# Patient Record
Sex: Female | Born: 1960 | Race: White | Hispanic: No | Marital: Single | Smoking: Former smoker
Health system: Southern US, Community
[De-identification: ages and names within clinical notes are randomized; demographics above are authoritative.]

## PROBLEM LIST (undated history)

## (undated) DIAGNOSIS — G4700 Insomnia, unspecified: Secondary | ICD-10-CM

## (undated) DIAGNOSIS — F32A Depression, unspecified: Secondary | ICD-10-CM

## (undated) DIAGNOSIS — R195 Other fecal abnormalities: Secondary | ICD-10-CM

## (undated) DIAGNOSIS — F419 Anxiety disorder, unspecified: Secondary | ICD-10-CM

## (undated) DIAGNOSIS — K5900 Constipation, unspecified: Secondary | ICD-10-CM

## (undated) DIAGNOSIS — R569 Unspecified convulsions: Secondary | ICD-10-CM

## (undated) DIAGNOSIS — Z9889 Other specified postprocedural states: Secondary | ICD-10-CM

## (undated) DIAGNOSIS — M419 Scoliosis, unspecified: Secondary | ICD-10-CM

## (undated) DIAGNOSIS — K449 Diaphragmatic hernia without obstruction or gangrene: Secondary | ICD-10-CM

## (undated) DIAGNOSIS — F329 Major depressive disorder, single episode, unspecified: Secondary | ICD-10-CM

## (undated) DIAGNOSIS — R112 Nausea with vomiting, unspecified: Secondary | ICD-10-CM

## (undated) DIAGNOSIS — R109 Unspecified abdominal pain: Secondary | ICD-10-CM

## (undated) DIAGNOSIS — F99 Mental disorder, not otherwise specified: Secondary | ICD-10-CM

## (undated) HISTORY — DX: Depression, unspecified: F32.A

## (undated) HISTORY — DX: Insomnia, unspecified: G47.00

## (undated) HISTORY — PX: BREAST SURGERY: SHX581

## (undated) HISTORY — DX: Unspecified convulsions: R56.9

## (undated) HISTORY — DX: Other fecal abnormalities: R19.5

## (undated) HISTORY — PX: DIAGNOSTIC LAPAROSCOPY: SUR761

## (undated) HISTORY — DX: Major depressive disorder, single episode, unspecified: F32.9

## (undated) HISTORY — DX: Anxiety disorder, unspecified: F41.9

## (undated) HISTORY — DX: Constipation, unspecified: K59.00

## (undated) HISTORY — DX: Unspecified abdominal pain: R10.9

## (undated) HISTORY — DX: Mental disorder, not otherwise specified: F99

## (undated) HISTORY — DX: Diaphragmatic hernia without obstruction or gangrene: K44.9

## (undated) HISTORY — DX: Scoliosis, unspecified: M41.9

---

## 1984-03-03 HISTORY — PX: BREAST BIOPSY: SHX20

## 2001-02-12 ENCOUNTER — Other Ambulatory Visit: Admission: RE | Admit: 2001-02-12 | Discharge: 2001-02-12 | Payer: Self-pay | Admitting: Obstetrics and Gynecology

## 2001-02-16 ENCOUNTER — Ambulatory Visit (HOSPITAL_COMMUNITY): Admission: RE | Admit: 2001-02-16 | Discharge: 2001-02-16 | Payer: Self-pay | Admitting: Obstetrics and Gynecology

## 2001-02-16 ENCOUNTER — Encounter: Payer: Self-pay | Admitting: Obstetrics and Gynecology

## 2002-06-07 ENCOUNTER — Ambulatory Visit (HOSPITAL_COMMUNITY): Admission: RE | Admit: 2002-06-07 | Discharge: 2002-06-07 | Payer: Self-pay | Admitting: Obstetrics and Gynecology

## 2002-06-07 ENCOUNTER — Encounter: Payer: Self-pay | Admitting: Obstetrics and Gynecology

## 2003-01-11 ENCOUNTER — Ambulatory Visit (HOSPITAL_COMMUNITY): Admission: RE | Admit: 2003-01-11 | Discharge: 2003-01-11 | Payer: Self-pay | Admitting: Obstetrics & Gynecology

## 2003-06-13 ENCOUNTER — Ambulatory Visit (HOSPITAL_COMMUNITY): Admission: RE | Admit: 2003-06-13 | Discharge: 2003-06-13 | Payer: Self-pay | Admitting: Obstetrics and Gynecology

## 2006-09-15 ENCOUNTER — Ambulatory Visit (HOSPITAL_COMMUNITY): Admission: RE | Admit: 2006-09-15 | Discharge: 2006-09-15 | Payer: Self-pay | Admitting: Obstetrics and Gynecology

## 2006-12-30 ENCOUNTER — Other Ambulatory Visit: Admission: RE | Admit: 2006-12-30 | Discharge: 2006-12-30 | Payer: Self-pay | Admitting: Obstetrics and Gynecology

## 2008-03-27 ENCOUNTER — Other Ambulatory Visit: Admission: RE | Admit: 2008-03-27 | Discharge: 2008-03-27 | Payer: Self-pay | Admitting: Obstetrics and Gynecology

## 2008-06-28 ENCOUNTER — Ambulatory Visit (HOSPITAL_COMMUNITY): Admission: RE | Admit: 2008-06-28 | Discharge: 2008-06-28 | Payer: Self-pay | Admitting: Obstetrics and Gynecology

## 2011-01-09 ENCOUNTER — Other Ambulatory Visit (HOSPITAL_COMMUNITY)
Admission: RE | Admit: 2011-01-09 | Discharge: 2011-01-09 | Disposition: A | Discharge status: Home / Self Care | Payer: BC Managed Care – PPO | Source: Ambulatory Visit | Attending: Obstetrics and Gynecology | Admitting: Obstetrics and Gynecology

## 2011-01-09 ENCOUNTER — Other Ambulatory Visit: Payer: Self-pay | Admitting: Adult Health

## 2011-01-09 DIAGNOSIS — Z01419 Encounter for gynecological examination (general) (routine) without abnormal findings: Secondary | ICD-10-CM | POA: Insufficient documentation

## 2011-01-09 DIAGNOSIS — R87810 Cervical high risk human papillomavirus (HPV) DNA test positive: Secondary | ICD-10-CM | POA: Insufficient documentation

## 2011-05-01 ENCOUNTER — Other Ambulatory Visit: Payer: Self-pay | Admitting: Obstetrics & Gynecology

## 2011-08-07 ENCOUNTER — Encounter (HOSPITAL_COMMUNITY): Payer: Self-pay | Admitting: Pharmacy Technician

## 2011-08-08 NOTE — Patient Instructions (Addendum)
20 Deborah Lucero  08/08/2011   Your procedure is scheduled on:   08/14/2011  Report to [facility] at  855  AM.  Call this number if you have problems the morning of surgery: [phone]   Remember:   Do not eat food:After Midnight.  May have clear liquids:until Midnight .    Take these medicines the morning of surgery with A SIP OF WATER: celexa   Do not wear jewelry, make-up or nail polish.  Do not wear lotions, powders, or perfumes. You may wear deodorant.  Do not shave 48 hours prior to surgery. Men may shave face and neck.  Do not bring valuables to the hospital.  Contacts, dentures or bridgework may not be worn into surgery.  Leave suitcase in the car. After surgery it may be brought to your room.  For patients admitted to the hospital, checkout time is 11:00 AM the day of discharge.   Patients discharged the day of surgery will not be allowed to drive home.  Name and phone number of your driver: family  Special Instructions: CHG Shower Use Special Wash: 1/2 bottle night before surgery and 1/2 bottle morning of surgery.   Please read over the following fact sheets that you were given: Pain Booklet, MRSA Information, Surgical Site Infection Prevention, Anesthesia Post-op Instructions and Care and Recovery After Surgery Endometrial Ablation Endometrial ablation removes the lining of the uterus (endometrium). It is usually a same day, outpatient treatment. Ablation helps avoid major surgery (such as a hysterectomy). A hysterectomy is removal of the cervix and uterus. Endometrial ablation has less risk and complications, has a shorter recovery period and is less expensive. After endometrial ablation, most women will have little or no menstrual bleeding. You may not keep your fertility. Pregnancy is no longer likely after this procedure but if you are pre-menopausal, you still need to use a reliable method of birth control following the procedure because pregnancy can occur. REASONS TO  HAVE THE PROCEDURE MAY INCLUDE:  Heavy periods.   Bleeding that is causing anemia.   Anovulatory bleeding, very irregular, bleeding.   Bleeding submucous fibroids (on the lining inside the uterus) if they are smaller than 3 centimeters.  REASONS NOT TO HAVE THE PROCEDURE MAY INCLUDE:  You wish to have more children.   You have a pre-cancerous or cancerous problem. The cause of any abnormal bleeding must be diagnosed before having the procedure.   You have pain coming from the uterus.   You have a submucus fibroid larger than 3 centimeters.   You recently had a baby.   You recently had an infection in the uterus.   You have a severe retro-flexed, tipped uterus and cannot insert the instrument to do the ablation.   You had a Cesarean section or deep major surgery on the uterus.   The inner cavity of the uterus is too large for the endometrial ablation instrument.  RISKS AND COMPLICATIONS   Perforation of the uterus.   Bleeding.   Infection of the uterus, bladder or vagina.   Injury to surrounding organs.   Cutting the cervix.   An air bubble to the lung (air embolus).   Pregnancy following the procedure.   Failure of the procedure to help the problem requiring hysterectomy.   Decreased ability to diagnose cancer in the lining of the uterus.  BEFORE THE PROCEDURE  The lining of the uterus must be tested to make sure there is no pre-cancerous or cancer cells present.  Medications may be given to make the lining of the uterus thinner.   Ultrasound may be used to evaluate the size and look for abnormalities of the uterus.   Future pregnancy is not desired.  PROCEDURE  There are different ways to destroy the lining of the uterus.   Resectoscope - radio frequency-alternating electric current is the most common one used.   Cryotherapy - freezing the lining of the uterus.   Heated Free Liquid - heated salt (saline) solution inserted into the uterus.   Microwave  - uses high energy microwaves in the uterus.   Thermal Balloon - a catheter with a balloon tip is inserted into the uterus and filled with heated fluid.  Your caregiver will talk with you about the method used in this clinic. They will also instruct you on the pros and cons of the procedure. Endometrial ablation is performed along with a procedure called operative hysteroscopy. A narrow viewing tube is inserted through the birth canal (vagina) and through the cervix into the uterus. A tiny camera attached to the viewing tube (hysteroscope) allows the uterine cavity to be shown on a TV monitor during surgery. Your uterus is filled with a harmless liquid to make the procedure easier. The lining of the uterus is then removed. The lining can also be removed with a resectoscope which allows your surgeon to cut away the lining of the uterus under direct vision. Usually, you will be able to go home within an hour after the procedure. HOME CARE INSTRUCTIONS   Do not drive for 24 hours.   No tampons, douching or intercourse for 2 weeks or until your caregiver approves.   Rest at home for 24 to 48 hours. You may then resume normal activities unless told differently by your caregiver.   Take your temperature two times a day for 4 days, and record it.   Take any medications your caregiver has ordered, as directed.   Use some form of contraception if you are pre-menopausal and do not want to get pregnant.  Bleeding after the procedure is normal. It varies from light spotting and mildly watery to bloody discharge for 4 to 6 weeks. You may also have mild cramping. Only take over-the-counter or prescription medicines for pain, discomfort, or fever as directed by your caregiver. Do not use aspirin, as this may aggravate bleeding. Frequent urination during the first 24 hours is normal. You will not know how effective your surgery is until at least 3 months after the surgery. SEEK IMMEDIATE MEDICAL CARE IF:    Bleeding is heavier than a normal menstrual cycle.   An oral temperature above 102 F (38.9 C) develops.   You have increasing cramps or pains not relieved with medication or develop belly (abdominal) pain which does not seem to be related to the same area of earlier cramping and pain.   You are light headed, weak or have fainting episodes.   You develop pain in the shoulder strap areas.   You have chest or leg pain.   You have abnormal vaginal discharge.   You have painful urination.  Document Released: 12/28/2003 Document Revised: 02/06/2011 Document Reviewed: 03/27/2007 [facility] Patient Information 2012 [place], [place].Dilation and Curettage or Vacuum Curettage Dilation and curettage (D&C) and vacuum curettage are minor procedures. A D&C involves stretching (dilation) the cervix and scraping (curettage) the inside lining of the womb (uterus). During a D&C, tissue is gently scraped from the inside lining of the uterus. During a vacuum curettage, the lining  and tissue in the uterus are removed with the use of gentle suction. Curettage may be performed for diagnostic or therapeutic purposes. As a diagnostic procedure, curettage is performed for the purpose of examining tissues from the uterus. Tissue examination may help determine causes or treatment options for symptoms. A diagnostic curettage may be performed for the following symptoms:  Irregular bleeding in the uterus.   Bleeding with the development of clots.   Spotting between menstrual periods.   Prolonged menstrual periods.   Bleeding after menopause.   No menstrual period (amenorrhea).   A change in size and shape of the uterus.  A therapeutic curettage is performed to remove tissue, blood, or a contraceptive device. Therapeutic curettage may be performed for the following conditions:   Removal of an IUD (intrauterine device).   Removal of retained placenta after giving birth. Retained placenta can cause bleeding  severe enough to require transfusions or an infection.   Abortion.   Miscarriage.   Removal of polyps inside the uterus.   Removal of uncommon types of fibroids (noncancerous lumps).  LET YOUR CAREGIVER KNOW ABOUT:   Allergies to food or medicine.   Medicines taken, including vitamins, herbs, eyedrops, over-the-counter medicines, and creams.   Use of steroids (by mouth or creams).   Previous problems with anesthetics or numbing medicines.   History of bleeding problems or blood clots.   Previous surgery.   Other health problems, including diabetes and kidney problems.   Possibility of pregnancy, if this applies.  RISKS AND COMPLICATIONS   Excessive bleeding.   Infection of the uterus.   Damage to the cervix.   Development of scar tissue (adhesions) inside the uterus, later causing abnormal amounts of menstrual bleeding.   Complications from the general anesthetic, if a general anesthetic is used.   Putting a hole (perforation) in the uterus. This is rare.  BEFORE THE PROCEDURE   Eat and drink before the procedure only as directed by your caregiver.   Arrange for someone to take you home.  PROCEDURE   This procedure may be done in a hospital, outpatient clinic, or caregiver's office.   You may be given a general anesthetic or a local anesthetic in and around the cervix.   You will lie on your back with your legs in stirrups.   There are two ways in which your cervix can be softened and dilated. These include:   Taking a medicine.   Having thin rods (laminaria) inserted into your cervix.   A curved tool (curette) will scrape cells from the inside lining of the uterus and will then be removed.  This procedure usually takes about 15 to 30 minutes. AFTER THE PROCEDURE   You will rest in the recovery area until you are stable and are ready to go home.   You will need to have someone take you home.   You may feel sick to your stomach (nauseous) or throw up  (vomit) if you had general anesthesia.   You may have a sore throat if a tube was placed in your throat during general anesthesia.   You may have light cramping and bleeding for 2 days to 2 weeks after the procedure.   Your uterus needs to make a new lining after the procedure. This may make your next period late.  Document Released: 02/17/2005 Document Revised: 02/06/2011 Document Reviewed: 09/15/2008 [facility] Patient Information 2012 [place], [place].PATIENT INSTRUCTIONS POST-ANESTHESIA  IMMEDIATELY FOLLOWING SURGERY:  Do not drive or operate machinery for the first  twenty four hours after surgery.  Do not make any important decisions for twenty four hours after surgery or while taking narcotic pain medications or sedatives.  If you develop intractable nausea and vomiting or a severe headache please notify your doctor immediately.  FOLLOW-UP:  Please make an appointment with your surgeon as instructed. You do not need to follow up with anesthesia unless specifically instructed to do so.  WOUND CARE INSTRUCTIONS (if applicable):  Keep a dry clean dressing on the anesthesia/puncture wound site if there is drainage.  Once the wound has quit draining you may leave it open to air.  Generally you should leave the bandage intact for twenty four hours unless there is drainage.  If the epidural site drains for more than 36-48 hours please call the anesthesia department.  QUESTIONS?:  Please feel free to call your physician or the hospital operator if you have any questions, and they will be happy to assist you.

## 2011-08-10 ENCOUNTER — Other Ambulatory Visit: Payer: Self-pay | Admitting: Obstetrics & Gynecology

## 2011-08-10 MED ORDER — KETOROLAC TROMETHAMINE 30 MG/ML IJ SOLN: 30.0000 mg | Freq: Once | INTRAMUSCULAR | Status: DC

## 2011-08-11 ENCOUNTER — Encounter (HOSPITAL_COMMUNITY)
Admission: RE | Admit: 2011-08-11 | Discharge: 2011-08-11 | Disposition: A | Discharge status: Home / Self Care | Payer: BC Managed Care – PPO | Source: Ambulatory Visit | Attending: Obstetrics & Gynecology | Admitting: Obstetrics & Gynecology

## 2011-08-11 ENCOUNTER — Encounter (HOSPITAL_COMMUNITY): Payer: Self-pay

## 2011-08-11 HISTORY — DX: Other specified postprocedural states: Z98.890

## 2011-08-11 HISTORY — DX: Anxiety disorder, unspecified: F41.9

## 2011-08-11 HISTORY — DX: Other specified postprocedural states: R11.2

## 2011-08-11 LAB — SURGICAL PCR SCREEN
MRSA, PCR: NEGATIVE
Staphylococcus aureus: NEGATIVE

## 2011-08-11 LAB — HCG, QUANTITATIVE, PREGNANCY: hCG, Beta Chain, Quant, S: <1 m[IU]/mL (ref <5)

## 2011-08-11 LAB — URINE MICROSCOPIC-ADD ON

## 2011-08-11 LAB — COMPREHENSIVE METABOLIC PANEL
ALT: 11 U/L (ref 0–35)
BUN: 15 mg/dL (ref 6–23)
Calcium: 10.2 mg/dL (ref 8.4–10.5)
GFR calc Af Amer: 71 mL/min — ABNORMAL LOW (ref >90)
Glucose, Bld: 103 mg/dL — ABNORMAL HIGH (ref 70–99)
Sodium: 138 mEq/L (ref 135–145)
Total Protein: 7.2 g/dL (ref 6.0–8.3)

## 2011-08-11 LAB — CBC
Hemoglobin: 13.5 g/dL (ref 12.0–15.0)
MCH: 30.5 pg (ref 26.0–34.0)
MCHC: 34.3 g/dL (ref 30.0–36.0)

## 2011-08-11 LAB — URINALYSIS, ROUTINE W REFLEX MICROSCOPIC
Nitrite: NEGATIVE
Specific Gravity, Urine: 1.02 (ref 1.005–1.030)
pH: 6 (ref 5.0–8.0)

## 2011-08-14 ENCOUNTER — Ambulatory Visit (HOSPITAL_COMMUNITY): Payer: BC Managed Care – PPO | Admitting: Anesthesiology

## 2011-08-14 ENCOUNTER — Encounter (HOSPITAL_COMMUNITY): Payer: Self-pay | Admitting: Anesthesiology

## 2011-08-14 ENCOUNTER — Encounter (HOSPITAL_COMMUNITY)
Admission: RE | Disposition: A | Discharge status: Home / Self Care | Payer: Self-pay | Source: Ambulatory Visit | Attending: Obstetrics & Gynecology

## 2011-08-14 ENCOUNTER — Ambulatory Visit (HOSPITAL_COMMUNITY)
Admission: RE | Admit: 2011-08-14 | Discharge: 2011-08-14 | Disposition: A | Discharge status: Home / Self Care | Payer: BC Managed Care – PPO | Source: Ambulatory Visit | Attending: Obstetrics & Gynecology | Admitting: Obstetrics & Gynecology

## 2011-08-14 DIAGNOSIS — N946 Dysmenorrhea, unspecified: Secondary | ICD-10-CM | POA: Insufficient documentation

## 2011-08-14 DIAGNOSIS — N920 Excessive and frequent menstruation with regular cycle: Secondary | ICD-10-CM | POA: Insufficient documentation

## 2011-08-14 DIAGNOSIS — Z01812 Encounter for preprocedural laboratory examination: Secondary | ICD-10-CM | POA: Insufficient documentation

## 2011-08-14 DIAGNOSIS — Z9889 Other specified postprocedural states: Secondary | ICD-10-CM

## 2011-08-14 HISTORY — PX: HYSTEROSCOPY WITH THERMACHOICE: SHX5396

## 2011-08-14 SURGERY — HYSTEROSCOPY WITH THERMACHOICE
Anesthesia: General | Wound class: Clean Contaminated

## 2011-08-14 MED ORDER — DEXTROSE 5 % IV SOLN
INTRAVENOUS | Status: DC | PRN
  Administered 2011-08-14: 13 mL via INTRAVENOUS

## 2011-08-14 MED ORDER — SODIUM CHLORIDE 0.9 % IR SOLN
Status: DC | PRN
  Administered 2011-08-14: 1000 mL

## 2011-08-14 MED ORDER — HYDROCODONE-ACETAMINOPHEN 5-500 MG PO TABS: 1.0000 | ORAL_TABLET | Freq: Four times a day (QID) | ORAL | Status: AC | PRN

## 2011-08-14 MED ORDER — CEFAZOLIN SODIUM 1-5 GM-% IV SOLN
INTRAVENOUS | Status: AC
  Filled 2011-08-14: qty 50

## 2011-08-14 MED ORDER — MIDAZOLAM HCL 2 MG/2ML IJ SOLN
INTRAMUSCULAR | Status: AC
  Filled 2011-08-14: qty 2

## 2011-08-14 MED ORDER — FENTANYL CITRATE 0.05 MG/ML IJ SOLN
25.0000 ug | INTRAMUSCULAR | Status: DC | PRN
  Administered 2011-08-14 (×2): 50 ug via INTRAVENOUS

## 2011-08-14 MED ORDER — FENTANYL CITRATE 0.05 MG/ML IJ SOLN
INTRAMUSCULAR | Status: AC
  Administered 2011-08-14: 50 ug via INTRAVENOUS
  Filled 2011-08-14: qty 2

## 2011-08-14 MED ORDER — KETOROLAC TROMETHAMINE 10 MG PO TABS: 10.0000 mg | ORAL_TABLET | Freq: Three times a day (TID) | ORAL | Status: AC | PRN

## 2011-08-14 MED ORDER — ONDANSETRON HCL 4 MG/2ML IJ SOLN
4.0000 mg | Freq: Once | INTRAMUSCULAR | Status: AC
  Administered 2011-08-14: 4 mg via INTRAVENOUS

## 2011-08-14 MED ORDER — PROPOFOL 10 MG/ML IV BOLUS
INTRAVENOUS | Status: DC | PRN
  Administered 2011-08-14: 150 mg via INTRAVENOUS

## 2011-08-14 MED ORDER — DEXAMETHASONE SODIUM PHOSPHATE 4 MG/ML IJ SOLN
INTRAMUSCULAR | Status: AC
  Administered 2011-08-14: 4 mg via INTRAVENOUS
  Filled 2011-08-14: qty 1

## 2011-08-14 MED ORDER — KETOROLAC TROMETHAMINE 30 MG/ML IJ SOLN
INTRAMUSCULAR | Status: AC
  Filled 2011-08-14: qty 1

## 2011-08-14 MED ORDER — PROPOFOL 10 MG/ML IV EMUL
INTRAVENOUS | Status: AC
  Filled 2011-08-14: qty 20

## 2011-08-14 MED ORDER — CEFAZOLIN SODIUM 1-5 GM-% IV SOLN: 1.0000 g | INTRAVENOUS | Status: DC

## 2011-08-14 MED ORDER — ONDANSETRON HCL 4 MG/2ML IJ SOLN: 4.0000 mg | Freq: Once | INTRAMUSCULAR | Status: DC | PRN

## 2011-08-14 MED ORDER — ONDANSETRON HCL 8 MG PO TABS: 8.0000 mg | ORAL_TABLET | Freq: Three times a day (TID) | ORAL | Status: AC | PRN

## 2011-08-14 MED ORDER — LACTATED RINGERS IV SOLN
INTRAVENOUS | Status: DC
  Administered 2011-08-14 (×2): via INTRAVENOUS

## 2011-08-14 MED ORDER — MIDAZOLAM HCL 2 MG/2ML IJ SOLN
INTRAMUSCULAR | Status: AC
  Administered 2011-08-14: 2 mg via INTRAVENOUS
  Filled 2011-08-14: qty 2

## 2011-08-14 MED ORDER — DEXAMETHASONE SODIUM PHOSPHATE 4 MG/ML IJ SOLN
4.0000 mg | Freq: Once | INTRAMUSCULAR | Status: AC
  Administered 2011-08-14: 4 mg via INTRAVENOUS

## 2011-08-14 MED ORDER — CEFAZOLIN SODIUM 1-5 GM-% IV SOLN
INTRAVENOUS | Status: DC | PRN
  Administered 2011-08-14: 1 g via INTRAVENOUS

## 2011-08-14 MED ORDER — SODIUM CHLORIDE 0.9 % IR SOLN
Status: DC | PRN
  Administered 2011-08-14: 3000 mL

## 2011-08-14 MED ORDER — MIDAZOLAM HCL 2 MG/2ML IJ SOLN
1.0000 mg | INTRAMUSCULAR | Status: DC | PRN
Start: 2011-08-14 — End: 2011-08-14
  Administered 2011-08-14: 2 mg via INTRAVENOUS

## 2011-08-14 MED ORDER — FENTANYL CITRATE 0.05 MG/ML IJ SOLN
INTRAMUSCULAR | Status: DC | PRN
  Administered 2011-08-14 (×2): 50 ug via INTRAVENOUS

## 2011-08-14 MED ORDER — MIDAZOLAM HCL 5 MG/5ML IJ SOLN
INTRAMUSCULAR | Status: DC | PRN
  Administered 2011-08-14: 2 mg via INTRAVENOUS

## 2011-08-14 MED ORDER — ONDANSETRON HCL 4 MG/2ML IJ SOLN
INTRAMUSCULAR | Status: AC
  Administered 2011-08-14: 4 mg via INTRAVENOUS
  Filled 2011-08-14: qty 2

## 2011-08-14 SURGICAL SUPPLY — 32 items
BAG DECANTER FOR FLEXI CONT (MISCELLANEOUS) ×3 IMPLANT
BAG HAMPER (MISCELLANEOUS) ×3 IMPLANT
CATH THERMACHOICE III (CATHETERS) ×3 IMPLANT
CLOTH BEACON ORANGE TIMEOUT ST (SAFETY) ×3 IMPLANT
COVER LIGHT HANDLE STERIS (MISCELLANEOUS) ×6 IMPLANT
ELECT REM PT RETURN 9FT ADLT (ELECTROSURGICAL) ×3
ELECTRODE REM PT RTRN 9FT ADLT (ELECTROSURGICAL) ×2 IMPLANT
GAUZE SPONGE 4X4 16PLY XRAY LF (GAUZE/BANDAGES/DRESSINGS) ×3 IMPLANT
GLOVE BIOGEL PI IND STRL 7.0 (GLOVE) ×4 IMPLANT
GLOVE BIOGEL PI IND STRL 8 (GLOVE) ×2 IMPLANT
GLOVE BIOGEL PI INDICATOR 7.0 (GLOVE) ×2
GLOVE BIOGEL PI INDICATOR 8 (GLOVE) ×1
GLOVE ECLIPSE 6.5 STRL STRAW (GLOVE) ×3 IMPLANT
GLOVE ECLIPSE 7.0 STRL STRAW (GLOVE) ×3 IMPLANT
GLOVE ECLIPSE 8.0 STRL XLNG CF (GLOVE) ×3 IMPLANT
GOWN STRL REIN XL XLG (GOWN DISPOSABLE) ×9 IMPLANT
INST SET HYSTEROSCOPY (KITS) ×3 IMPLANT
IV D5W 500ML (IV SOLUTION) ×3 IMPLANT
IV NS IRRIG 3000ML ARTHROMATIC (IV SOLUTION) ×3 IMPLANT
KIT ROOM TURNOVER APOR (KITS) ×3 IMPLANT
MANIFOLD NEPTUNE II (INSTRUMENTS) ×3 IMPLANT
MARKER SKIN DUAL TIP RULER LAB (MISCELLANEOUS) ×3 IMPLANT
NS IRRIG 1000ML POUR BTL (IV SOLUTION) ×3 IMPLANT
PACK BASIC III (CUSTOM PROCEDURE TRAY) ×1
PACK SRG BSC III STRL LF ECLPS (CUSTOM PROCEDURE TRAY) ×2 IMPLANT
PAD ARMBOARD 7.5X6 YLW CONV (MISCELLANEOUS) ×3 IMPLANT
PAD TELFA 3X4 1S STER (GAUZE/BANDAGES/DRESSINGS) ×3 IMPLANT
PENCIL HANDSWITCHING (ELECTRODE) ×3 IMPLANT
SET BASIN LINEN APH (SET/KITS/TRAYS/PACK) ×3 IMPLANT
SET IRRIG Y TYPE TUR BLADDER L (SET/KITS/TRAYS/PACK) ×3 IMPLANT
SHEET LAVH (DRAPES) ×3 IMPLANT
YANKAUER SUCT BULB TIP 10FT TU (MISCELLANEOUS) ×3 IMPLANT

## 2011-08-14 NOTE — H&P (Signed)
Deborah Lucero is an 51 y.o. female with long history of heavy frequent painful periods, been managing on Megace.  Normal sonogram no chronic pain no dyspareunia.  Patient is having untoward symptoms on megace so we are going to proceed with ablation  OB History    Grav Para Term Preterm Abortions TAB SAB Ect Mult Living                   Past Medical History  Diagnosis Date  . PONV (postoperative nausea and vomiting)   . Anxiety     Past Surgical History  Procedure Date  . Tonsillectomy     as child  . Breast surgery     lumpectomy right breast-benign  . Diagnostic laparoscopy   . Cesarean section     No family history on file.  Social History:  reports that she has never smoked. She does not have any smokeless tobacco history on file. She reports that she drinks about 1.2 ounces of alcohol per week. She reports that she does not use illicit drugs.  Allergies: No Known Allergies  Prescriptions prior to admission  Medication Sig Dispense Refill  . citalopram (CELEXA) 40 MG tablet Take 40 mg by mouth every morning.      . megestrol (MEGACE) 40 MG tablet Take 40 mg by mouth every morning.        ROS  Review of Systems  Constitutional: Negative for fever, chills, weight loss, malaise/fatigue and diaphoresis.  HENT: Negative for hearing loss, ear pain, nosebleeds, congestion, sore throat, neck pain, tinnitus and ear discharge.   Eyes: Negative for blurred vision, double vision, photophobia, pain, discharge and redness.  Respiratory: Negative for cough, hemoptysis, sputum production, shortness of breath, wheezing and stridor.   Cardiovascular: Negative for chest pain, palpitations, orthopnea, claudication, leg swelling and PND.  Gastrointestinal: Negative for abdominal pain. Negative for heartburn, nausea, vomiting, diarrhea, constipation, blood in stool and melena.  Genitourinary: Negative for dysuria, urgency, frequency, hematuria and flank pain.  Musculoskeletal:  Negative for myalgias, back pain, joint pain and falls.  Skin: Negative for itching and rash.  Neurological: Negative for dizziness, tingling, tremors, sensory change, speech change, focal weakness, seizures, loss of consciousness, weakness and headaches.  Endo/Heme/Allergies: Negative for environmental allergies and polydipsia. Does not bruise/bleed easily.  Psychiatric/Behavioral: Negative for depression, suicidal ideas, hallucinations, memory loss and substance abuse. The patient is not nervous/anxious and does not have insomnia.      Blood pressure 113/68, pulse 72, temperature 97.7 F (36.5 C), temperature source Oral, resp. rate 19, SpO2 97.00%. Physical Exam Physical Exam  Vitals reviewed. Constitutional: She is oriented to person, place, and time. She appears well-developed and well-nourished.  HENT:  Head: Normocephalic and atraumatic.  Right Ear: External ear normal.  Left Ear: External ear normal.  Nose: Nose normal.  Mouth/Throat: Oropharynx is clear and moist.  Eyes: Conjunctivae and EOM are normal. Pupils are equal, round, and reactive to light. Right eye exhibits no discharge. Left eye exhibits no discharge. No scleral icterus.  Neck: Normal range of motion. Neck supple. No tracheal deviation present. No thyromegaly present.  Cardiovascular: Normal rate, regular rhythm, normal heart sounds and intact distal pulses.  Exam reveals no gallop and no friction rub.   No murmur heard. Respiratory: Effort normal and breath sounds normal. No respiratory distress. She has no wheezes. She has no rales. She exhibits no tenderness.  GI: Soft. Bowel sounds are normal. She exhibits no distension and no mass. There is tenderness.  There is no rebound and no guarding.  Genitourinary:       Vulva is normal without lesions Vagina is pink moist without discharge Cervix normal in appearance and pap is normal Uterus is normal Adnexa is negative with normal sized ovaries by sonogram    Musculoskeletal: Normal range of motion. She exhibits no edema and no tenderness.  Neurological: She is alert and oriented to person, place, and time. She has normal reflexes. She displays normal reflexes. No cranial nerve deficit. She exhibits normal muscle tone. Coordination normal.  Skin: Skin is warm and dry. No rash noted. No erythema. No pallor.  Psychiatric: She has a normal mood and affect. Her behavior is normal. Judgment and thought content normal.   Recent Results (from the past 336 hour(s))  SURGICAL PCR SCREEN   Collection Time   08/11/11  8:13 AM      Component Value Range   MRSA, PCR NEGATIVE  NEGATIVE   Staphylococcus aureus NEGATIVE  NEGATIVE  URINALYSIS, ROUTINE W REFLEX MICROSCOPIC   Collection Time   08/11/11  8:13 AM      Component Value Range   Color, Urine YELLOW  YELLOW   APPearance CLEAR  CLEAR   Specific Gravity, Urine 1.020  1.005 - 1.030   pH 6.0  5.0 - 8.0   Glucose, UA NEGATIVE  NEGATIVE mg/dL   Hgb urine dipstick MODERATE (*) NEGATIVE   Bilirubin Urine NEGATIVE  NEGATIVE   Ketones, ur NEGATIVE  NEGATIVE mg/dL   Protein, ur NEGATIVE  NEGATIVE mg/dL   Urobilinogen, UA 0.2  0.0 - 1.0 mg/dL   Nitrite NEGATIVE  NEGATIVE   Leukocytes, UA SMALL (*) NEGATIVE  URINE MICROSCOPIC-ADD ON   Collection Time   08/11/11  8:13 AM      Component Value Range   Squamous Epithelial / LPF MANY (*) RARE   WBC, UA 7-10  <3 WBC/hpf   RBC / HPF 3-6  <3 RBC/hpf   Bacteria, UA MANY (*) RARE  CBC   Collection Time   08/11/11  8:15 AM      Component Value Range   WBC 7.0  4.0 - 10.5 K/uL   RBC 4.43  3.87 - 5.11 MIL/uL   Hemoglobin 13.5  12.0 - 15.0 g/dL   HCT 16.1  09.6 - 04.5 %   MCV 88.9  78.0 - 100.0 fL   MCH 30.5  26.0 - 34.0 pg   MCHC 34.3  30.0 - 36.0 g/dL   RDW 40.9  81.1 - 91.4 %   Platelets 299  150 - 400 K/uL  COMPREHENSIVE METABOLIC PANEL   Collection Time   08/11/11  8:15 AM      Component Value Range   Sodium 138  135 - 145 mEq/L   Potassium 4.2  3.5 -  5.1 mEq/L   Chloride 103  96 - 112 mEq/L   CO2 23  19 - 32 mEq/L   Glucose, Bld 103 (*) 70 - 99 mg/dL   BUN 15  6 - 23 mg/dL   Creatinine, Ser 7.82  0.50 - 1.10 mg/dL   Calcium 95.6  8.4 - 21.3 mg/dL   Total Protein 7.2  6.0 - 8.3 g/dL   Albumin 4.1  3.5 - 5.2 g/dL   AST 14  0 - 37 U/L   ALT 11  0 - 35 U/L   Alkaline Phosphatase 53  39 - 117 U/L   Total Bilirubin 0.5  0.3 - 1.2 mg/dL   GFR calc  non Af Amer 61 (*) >90 mL/min   GFR calc Af Amer 71 (*) >90 mL/min  HCG, QUANTITATIVE, PREGNANCY   Collection Time   08/11/11  8:15 AM      Component Value Range   hCG, Beta Chain, Quant, S <1  <5 mIU/mL         Assessment/Plan: 1.  Menometrorrhagia 2.  Dysmenorrhea  Proceed with ablation.  Understands risk of failure  Brenan Modesto H 08/14/2011, 12:11 PM

## 2011-08-14 NOTE — OR Nursing (Signed)
Up to bathroom to void 

## 2011-08-14 NOTE — Transfer of Care (Signed)
Immediate Anesthesia Transfer of Care Note  Patient: Deborah Lucero  Procedure(s) Performed: Procedure(s) (LRB): HYSTEROSCOPY WITH THERMACHOICE ()  Patient Location: PACU  Anesthesia Type: General  Level of Consciousness: awake and patient cooperative  Airway & Oxygen Therapy: Patient Spontanous Breathing  Post-op Assessment: Report given to PACU RN, Post -op Vital signs reviewed and stable and Patient moving all extremities  Post vital signs: Reviewed and stable  Complications: No apparent anesthesia complications

## 2011-08-14 NOTE — Anesthesia Preprocedure Evaluation (Signed)
Anesthesia Evaluation  Patient identified by MRN, date of birth, ID band Patient awake    Reviewed: Allergy & Precautions, H&P , NPO status , Patient's Chart, lab work & pertinent test results  History of Anesthesia Complications (+) PONV  Airway Mallampati: I      Dental  (+) Teeth Intact   Pulmonary  breath sounds clear to auscultation        Cardiovascular negative cardio ROS  Rhythm:Regular Rate:Normal     Neuro/Psych Anxiety    GI/Hepatic negative GI ROS,   Endo/Other    Renal/GU      Musculoskeletal   Abdominal   Peds  Hematology   Anesthesia Other Findings   Reproductive/Obstetrics                           Anesthesia Physical Anesthesia Plan  ASA: II  Anesthesia Plan: General   Post-op Pain Management:    Induction: Intravenous  Airway Management Planned: LMA  Additional Equipment:   Intra-op Plan:   Post-operative Plan: Extubation in OR  Informed Consent: I have reviewed the patients History and Physical, chart, labs and discussed the procedure including the risks, benefits and alternatives for the proposed anesthesia with the patient or authorized representative who has indicated his/her understanding and acceptance.     Plan Discussed with:   Anesthesia Plan Comments:         Anesthesia Quick Evaluation

## 2011-08-14 NOTE — Discharge Instructions (Signed)
Endometrial Ablation Endometrial ablation removes the lining of the uterus (endometrium). It is usually a same day, outpatient treatment. Ablation helps avoid major surgery (such as a hysterectomy). A hysterectomy is removal of the cervix and uterus. Endometrial ablation has less risk and complications, has a shorter recovery period and is less expensive. After endometrial ablation, most women will have little or no menstrual bleeding. You may not keep your fertility. Pregnancy is no longer likely after this procedure but if you are pre-menopausal, you still need to use a reliable method of birth control following the procedure because pregnancy can occur. REASONS TO HAVE THE PROCEDURE MAY INCLUDE:  Heavy periods.   Bleeding that is causing anemia.   Anovulatory bleeding, very irregular, bleeding.   Bleeding submucous fibroids (on the lining inside the uterus) if they are smaller than 3 centimeters.  REASONS NOT TO HAVE THE PROCEDURE MAY INCLUDE:  You wish to have more children.   You have a pre-cancerous or cancerous problem. The cause of any abnormal bleeding must be diagnosed before having the procedure.   You have pain coming from the uterus.   You have a submucus fibroid larger than 3 centimeters.   You recently had a baby.   You recently had an infection in the uterus.   You have a severe retro-flexed, tipped uterus and cannot insert the instrument to do the ablation.   You had a Cesarean section or deep major surgery on the uterus.   The inner cavity of the uterus is too large for the endometrial ablation instrument.  RISKS AND COMPLICATIONS   Perforation of the uterus.   Bleeding.   Infection of the uterus, bladder or vagina.   Injury to surrounding organs.   Cutting the cervix.   An air bubble to the lung (air embolus).   Pregnancy following the procedure.   Failure of the procedure to help the problem requiring hysterectomy.   Decreased ability to diagnose  cancer in the lining of the uterus.  BEFORE THE PROCEDURE  The lining of the uterus must be tested to make sure there is no pre-cancerous or cancer cells present.   Medications may be given to make the lining of the uterus thinner.   Ultrasound may be used to evaluate the size and look for abnormalities of the uterus.   Future pregnancy is not desired.  PROCEDURE  There are different ways to destroy the lining of the uterus.   Resectoscope - radio frequency-alternating electric current is the most common one used.   Cryotherapy - freezing the lining of the uterus.   Heated Free Liquid - heated salt (saline) solution inserted into the uterus.   Microwave - uses high energy microwaves in the uterus.   Thermal Balloon - a catheter with a balloon tip is inserted into the uterus and filled with heated fluid.  Your caregiver will talk with you about the method used in this clinic. They will also instruct you on the pros and cons of the procedure. Endometrial ablation is performed along with a procedure called operative hysteroscopy. A narrow viewing tube is inserted through the birth canal (vagina) and through the cervix into the uterus. A tiny camera attached to the viewing tube (hysteroscope) allows the uterine cavity to be shown on a TV monitor during surgery. Your uterus is filled with a harmless liquid to make the procedure easier. The lining of the uterus is then removed. The lining can also be removed with a resectoscope which allows your surgeon   to cut away the lining of the uterus under direct vision. Usually, you will be able to go home within an hour after the procedure. HOME CARE INSTRUCTIONS   Do not drive for 24 hours.   No tampons, douching or intercourse for 2 weeks or until your caregiver approves.   Rest at home for 24 to 48 hours. You may then resume normal activities unless told differently by your caregiver.   Take your temperature two times a day for 4 days, and record  it.   Take any medications your caregiver has ordered, as directed.   Use some form of contraception if you are pre-menopausal and do not want to get pregnant.  Bleeding after the procedure is normal. It varies from light spotting and mildly watery to bloody discharge for 4 to 6 weeks. You may also have mild cramping. Only take over-the-counter or prescription medicines for pain, discomfort, or fever as directed by your caregiver. Do not use aspirin, as this may aggravate bleeding. Frequent urination during the first 24 hours is normal. You will not know how effective your surgery is until at least 3 months after the surgery. SEEK IMMEDIATE MEDICAL CARE IF:   Bleeding is heavier than a normal menstrual cycle.   An oral temperature above 102 F (38.9 C) develops.   You have increasing cramps or pains not relieved with medication or develop belly (abdominal) pain which does not seem to be related to the same area of earlier cramping and pain.   You are light headed, weak or have fainting episodes.   You develop pain in the shoulder strap areas.   You have chest or leg pain.   You have abnormal vaginal discharge.   You have painful urination.  Document Released: 12/28/2003 Document Revised: 02/06/2011 Document Reviewed: 03/27/2007 [employer] Patient Information 2012 [employer]. 

## 2011-08-14 NOTE — Anesthesia Procedure Notes (Signed)
Procedure Name: LMA Insertion Date/Time: 08/14/2011 12:34 PM Performed by: Despina Hidden Pre-anesthesia Checklist: Patient identified, Patient being monitored, Emergency Drugs available and Suction available Patient Re-evaluated:Patient Re-evaluated prior to inductionOxygen Delivery Method: Circle system utilized Preoxygenation: Pre-oxygenation with 100% oxygen Intubation Type: IV induction Ventilation: Mask ventilation without difficulty LMA: LMA inserted LMA Size: 3.0 Number of attempts: 1 Placement Confirmation: positive ETCO2 and breath sounds checked- equal and bilateral Tube secured with: Tape Dental Injury: Teeth and Oropharynx as per pre-operative assessment

## 2011-08-14 NOTE — Op Note (Signed)
Preoperative diagnosis: Menometrorrhagia                                        Dysmenorrhea   Postoperative diagnoses: Same as above   Procedure: Hysteroscopy,  endometrial ablation  Surgeon: Despina Hidden MD  Anesthesia: Laryngeal mask airway  Findings: The endometrium was normal. There were no fibroid or other abnormalities.  Description of operation: The patient was taken to the operating room and placed in the supine position. She underwent general anesthesia using the laryngeal mask airway. She was placed in the dorsal lithotomy position and prepped and draped in the usual sterile fashion. A Graves speculum was placed and the anterior cervical lip was grasped with a single-tooth tenaculum. The cervix was dilated serially to allow passage of the hysteroscope. Diagnostic hysteroscopy was performed and was found to be normal.  The ThermaChoice 3 endometrial ablation balloon was then used were 13 cc of D5W was required to maintain a pressure of 190-200 mm of mercury throughout the procedure. Toatl therapy time was 8:44.  All of the equipment worked well throughout the procedure. All of the fluid was returned at the end of the procedure. The patient was awakened from anesthesia and taken to the recovery room in good stable condition all counts were correct. She received 1 g of Ancef and 30 mg of Toradol preoperatively. She will be discharged from the recovery room and followed up in the office in 1 weeks.  Lilie Vezina H 08/14/2011 1:18 PM

## 2011-08-14 NOTE — Anesthesia Postprocedure Evaluation (Signed)
  Anesthesia Post-op Note  Patient: Deborah Lucero  Procedure(s) Performed: Procedure(s) (LRB): HYSTEROSCOPY WITH THERMACHOICE ()  Patient Location: PACU  Anesthesia Type: General  Level of Consciousness: awake, alert , oriented and patient cooperative  Airway and Oxygen Therapy: Patient Spontanous Breathing  Post-op Pain: none  Post-op Assessment: Post-op Vital signs reviewed, Patient's Cardiovascular Status Stable, Respiratory Function Stable, Patent Airway, No signs of Nausea or vomiting and Pain level controlled  Post-op Vital Signs: Reviewed and stable  Complications: No apparent anesthesia complications

## 2011-08-18 ENCOUNTER — Encounter (HOSPITAL_COMMUNITY): Payer: Self-pay | Admitting: Obstetrics & Gynecology

## 2011-11-18 ENCOUNTER — Other Ambulatory Visit: Payer: Self-pay | Admitting: Obstetrics and Gynecology

## 2011-11-18 DIAGNOSIS — Z139 Encounter for screening, unspecified: Secondary | ICD-10-CM

## 2011-11-25 ENCOUNTER — Ambulatory Visit (HOSPITAL_COMMUNITY)
Admission: RE | Admit: 2011-11-25 | Discharge: 2011-11-25 | Disposition: A | Discharge status: Home / Self Care | Payer: BC Managed Care – PPO | Source: Ambulatory Visit | Attending: Obstetrics and Gynecology | Admitting: Obstetrics and Gynecology

## 2011-11-25 DIAGNOSIS — Z1231 Encounter for screening mammogram for malignant neoplasm of breast: Secondary | ICD-10-CM | POA: Insufficient documentation

## 2011-11-25 DIAGNOSIS — Z139 Encounter for screening, unspecified: Secondary | ICD-10-CM

## 2012-03-08 ENCOUNTER — Other Ambulatory Visit (HOSPITAL_COMMUNITY)
Admission: RE | Admit: 2012-03-08 | Discharge: 2012-03-08 | Disposition: A | Discharge status: Home / Self Care | Payer: BC Managed Care – PPO | Source: Ambulatory Visit | Attending: Obstetrics and Gynecology | Admitting: Obstetrics and Gynecology

## 2012-03-08 ENCOUNTER — Other Ambulatory Visit: Payer: Self-pay | Admitting: Adult Health

## 2012-03-08 DIAGNOSIS — R87810 Cervical high risk human papillomavirus (HPV) DNA test positive: Secondary | ICD-10-CM | POA: Insufficient documentation

## 2012-03-08 DIAGNOSIS — Z1151 Encounter for screening for human papillomavirus (HPV): Secondary | ICD-10-CM | POA: Insufficient documentation

## 2012-03-08 DIAGNOSIS — Z01419 Encounter for gynecological examination (general) (routine) without abnormal findings: Secondary | ICD-10-CM | POA: Insufficient documentation

## 2012-08-07 ENCOUNTER — Other Ambulatory Visit: Payer: Self-pay | Admitting: Adult Health

## 2012-09-11 ENCOUNTER — Other Ambulatory Visit: Payer: Self-pay | Admitting: Adult Health

## 2012-09-20 ENCOUNTER — Telehealth: Payer: Self-pay | Admitting: Adult Health

## 2012-09-20 NOTE — Telephone Encounter (Signed)
?   Sun poisoning  Try benadryl and cool moist compresses and cortisone  And if not better call to be seen

## 2012-10-13 ENCOUNTER — Other Ambulatory Visit: Payer: Self-pay | Admitting: Adult Health

## 2012-10-14 ENCOUNTER — Other Ambulatory Visit: Payer: Self-pay | Admitting: Adult Health

## 2012-11-04 ENCOUNTER — Telehealth: Payer: Self-pay | Admitting: Obstetrics & Gynecology

## 2012-11-04 NOTE — Telephone Encounter (Signed)
Left message x 1. JSY 

## 2012-11-05 ENCOUNTER — Telehealth: Payer: Self-pay | Admitting: Obstetrics & Gynecology

## 2012-11-05 NOTE — Telephone Encounter (Signed)
Left message x 1. JSY 

## 2012-11-10 ENCOUNTER — Ambulatory Visit: Payer: Self-pay | Admitting: Adult Health

## 2012-11-10 NOTE — Telephone Encounter (Signed)
Pt has rash beside of left breast where arm touches. Treating it with cortizone and seems to be getting better. Pt stated that her daughter has an appointment on Monday to see JAG so I got the front office to put in an appointment for her as well.

## 2012-11-15 ENCOUNTER — Encounter: Payer: Self-pay | Admitting: Adult Health

## 2012-11-15 ENCOUNTER — Ambulatory Visit (INDEPENDENT_AMBULATORY_CARE_PROVIDER_SITE_OTHER): Payer: BC Managed Care – PPO | Admitting: Adult Health

## 2012-11-15 VITALS — BP 116/84 | Ht 65.0 in | Wt 159.0 lb

## 2012-11-15 DIAGNOSIS — R232 Flushing: Secondary | ICD-10-CM

## 2012-11-15 DIAGNOSIS — N951 Menopausal and female climacteric states: Secondary | ICD-10-CM

## 2012-11-15 DIAGNOSIS — R21 Rash and other nonspecific skin eruption: Secondary | ICD-10-CM

## 2012-11-15 MED ORDER — PREDNISONE 10 MG PO TABS: 10.0000 mg | ORAL_TABLET | Freq: Every day | ORAL | Status: DC

## 2012-11-15 NOTE — Patient Instructions (Addendum)

## 2012-11-15 NOTE — Progress Notes (Signed)
Subjective:     Patient ID: Deborah Lucero, female   DOB: Feb 16, 1961, 52 y.o.   MRN: [id number]  HPI Deborah Lucero is a 52 year old white female in complaining of rash on left inner arm and chest and it itches, has tried benadryl,withourt relief, not aware of any contact or new products. Is having hot flashes too.  Review of Systems Positives in HPI Reviewed past medical,surgical, social and family history. Reviewed medications and allergies.     Objective:   Physical Exam BP 116/84  Ht 5\' 5"  (1.651 m)  Wt 159 lb (72.122 kg)  BMI 26.46 kg/m2   Has raised skin colored rash left arm and chest with some broken skin where scratched Discussed HRT with pt to think about it see if they continue  Assessment:     Rash Hot flashes    Plan:     Rx prednisone 40 mg #10 1 daily, no refills Call if hot flashes persist and desires HRT

## 2012-11-22 ENCOUNTER — Telehealth: Payer: Self-pay | Admitting: Adult Health

## 2012-11-22 NOTE — Telephone Encounter (Signed)
Rash better on arms but spreading on chest still has 2 days of prednisone to come in tomorrow at after school to re check

## 2012-11-23 ENCOUNTER — Telehealth: Payer: Self-pay | Admitting: Adult Health

## 2012-11-23 ENCOUNTER — Ambulatory Visit: Payer: BC Managed Care – PPO | Admitting: Adult Health

## 2012-11-23 NOTE — Telephone Encounter (Signed)
Can't come in today rash better but itches still try antihistamine and call prn

## 2012-11-24 ENCOUNTER — Other Ambulatory Visit: Payer: Self-pay | Admitting: Adult Health

## 2012-11-24 ENCOUNTER — Telehealth: Payer: Self-pay | Admitting: Adult Health

## 2012-11-24 MED ORDER — PREDNISONE 10 MG PO TABS: 10.0000 mg | ORAL_TABLET | Freq: Every day | ORAL | Status: DC

## 2012-11-24 MED ORDER — RANITIDINE HCL 150 MG PO CAPS: 150.0000 mg | ORAL_CAPSULE | Freq: Two times a day (BID) | ORAL | Status: DC

## 2012-11-24 MED ORDER — LEVOCETIRIZINE DIHYDROCHLORIDE 5 MG PO TABS: 5.0000 mg | ORAL_TABLET | Freq: Every evening | ORAL | Status: DC

## 2012-11-24 NOTE — Telephone Encounter (Signed)
Lis came by rash has moved to LU abdomen and is itching, got Dr Despina Hidden to look at it too will rx prednisone 40 mg #10 1 daily x 10 days and xyzal 5 mg 12 daily at hs and zantac 150 mg 1 bid

## 2012-12-06 ENCOUNTER — Telehealth: Payer: Self-pay | Admitting: *Deleted

## 2012-12-06 NOTE — Telephone Encounter (Signed)
Rash still there and itching, go to dermatologist, pt said she would call

## 2012-12-27 ENCOUNTER — Other Ambulatory Visit: Payer: Self-pay | Admitting: Adult Health

## 2013-02-01 ENCOUNTER — Other Ambulatory Visit: Payer: Self-pay | Admitting: Adult Health

## 2013-02-02 ENCOUNTER — Telehealth: Payer: Self-pay | Admitting: Adult Health

## 2013-02-02 MED ORDER — ESTRADIOL-NORETHINDRONE ACET 0.05-0.14 MG/DAY TD PTTW: 1.0000 | MEDICATED_PATCH | TRANSDERMAL | Status: DC

## 2013-02-02 NOTE — Telephone Encounter (Signed)
Pt complains of hot flashes and moody wants to try the patch will rx combipatch

## 2013-02-02 NOTE — Telephone Encounter (Signed)
Pt requesting RX for HRT.   Pt states saw Cyril Mourning, NP 11/2012 and was told if continued to have hot flashes to call office back and Victorino Dike would give RX for HRT.

## 2013-03-06 ENCOUNTER — Other Ambulatory Visit: Payer: Self-pay | Admitting: Adult Health

## 2013-03-07 ENCOUNTER — Other Ambulatory Visit: Payer: Self-pay | Admitting: Adult Health

## 2013-03-15 ENCOUNTER — Ambulatory Visit (INDEPENDENT_AMBULATORY_CARE_PROVIDER_SITE_OTHER): Payer: BC Managed Care – PPO | Admitting: Adult Health

## 2013-03-15 ENCOUNTER — Encounter: Payer: Self-pay | Admitting: Adult Health

## 2013-03-15 VITALS — BP 110/70 | Ht 65.0 in | Wt 164.0 lb

## 2013-03-15 DIAGNOSIS — F411 Generalized anxiety disorder: Secondary | ICD-10-CM

## 2013-03-15 DIAGNOSIS — F419 Anxiety disorder, unspecified: Secondary | ICD-10-CM | POA: Insufficient documentation

## 2013-03-15 MED ORDER — ALPRAZOLAM 0.5 MG PO TABS: ORAL_TABLET | ORAL | Status: DC

## 2013-03-15 NOTE — Patient Instructions (Signed)
Generalized Anxiety Disorder Generalized anxiety disorder (GAD) is a mental disorder. It interferes with life functions, including relationships, work, and school. GAD is different from normal anxiety, which everyone experiences at some point in their lives in response to specific life events and activities. Normal anxiety actually helps us prepare for and get through these life events and activities. Normal anxiety goes away after the event or activity is over.  GAD causes anxiety that is not necessarily related to specific events or activities. It also causes excess anxiety in proportion to specific events or activities. The anxiety associated with GAD is also difficult to control. GAD can vary from mild to severe. People with severe GAD can have intense waves of anxiety with physical symptoms (panic attacks).  SYMPTOMS The anxiety and worry associated with GAD are difficult to control. This anxiety and worry are related to many life events and activities and also occur more days than not for 6 months or longer. People with GAD also have three or more of the following symptoms (one or more in children):  Restlessness.   Fatigue.  Difficulty concentrating.   Irritability.  Muscle tension.  Difficulty sleeping or unsatisfying sleep. DIAGNOSIS GAD is diagnosed through an assessment by your caregiver. Your caregiver will ask you questions aboutyour mood,physical symptoms, and events in your life. Your caregiver may ask you about your medical history and use of alcohol or drugs, including prescription medications. Your caregiver may also do a physical exam and blood tests. Certain medical conditions and the use of certain substances can cause symptoms similar to those associated with GAD. Your caregiver may refer you to a mental health specialist for further evaluation. TREATMENT The following therapies are usually used to treat GAD:   Medication Antidepressant medication usually is  prescribed for long-term daily control. Antianxiety medications may be added in severe cases, especially when panic attacks occur.   Talk therapy (psychotherapy) Certain types of talk therapy can be helpful in treating GAD by providing support, education, and guidance. A form of talk therapy called cognitive behavioral therapy can teach you healthy ways to think about and react to daily life events and activities.  Stress managementtechniques These include yoga, meditation, and exercise and can be very helpful when they are practiced regularly. A mental health specialist can help determine which treatment is best for you. Some people see improvement with one therapy. However, other people require a combination of therapies. Document Released: 06/14/2012 Document Reviewed: 06/14/2012 [facility] Patient Information 2014 [place], [place]. Follow up prn

## 2013-03-15 NOTE — Progress Notes (Signed)
Subjective:     Patient ID: Deborah Lucero, female   DOB: Jan 19, 1961, 53 y.o.   MRN: [id number]  HPI Deborah Lucero is a 53 year old white female, married, in to get refills on xanax.She is taking celexa 40 mg daily and needs xanax.She has twin teenagers and  her sister who is older has memory problems.  Review of Systems See HPI Reviewed past medical,surgical, social and family history. Reviewed medications and allergies.     Objective:   Physical Exam BP 110/70  Ht 5\' 5"  (1.651 m)  Wt 164 lb (74.39 kg)  BMI 27.29 kg/m2   Discussed stress level and her anxiety,continue meds, try to get sister to see neurologist again Discussed with Dr Despina HiddenEure.  Assessment:     Anxiety     Plan:    refilled xanax 0.5 mg # 90 1 every 8 hours prn with 2 refills Follow up prn  Review handout on GAD

## 2013-05-02 ENCOUNTER — Other Ambulatory Visit: Payer: Self-pay | Admitting: Adult Health

## 2013-05-02 ENCOUNTER — Telehealth: Payer: Self-pay | Admitting: Adult Health

## 2013-05-02 DIAGNOSIS — R197 Diarrhea, unspecified: Secondary | ICD-10-CM

## 2013-05-02 NOTE — Telephone Encounter (Signed)
Pt called complaining of having watery stools since Thursday, vomited Friday, took citrate of magnesium.no fever or pain but feels bad, Has history of ?IBS, refer to Dr Karilyn Cotaehman, if has pain or fever go to ER

## 2013-05-02 NOTE — Telephone Encounter (Signed)
Pt states sick since last Thursday, took several laxatives due to constipation, having clear liquid since yesterday and headache. Feels like she has not "emptied herself." Pt states concerned about IBS, states never gets sick. Unsure if she has a fever.

## 2013-05-05 ENCOUNTER — Encounter: Payer: Self-pay | Admitting: Adult Health

## 2013-05-05 ENCOUNTER — Ambulatory Visit (INDEPENDENT_AMBULATORY_CARE_PROVIDER_SITE_OTHER): Payer: BC Managed Care – PPO | Admitting: Adult Health

## 2013-05-05 ENCOUNTER — Telehealth: Payer: Self-pay | Admitting: Adult Health

## 2013-05-05 VITALS — BP 102/58 | Ht 65.0 in | Wt 166.0 lb

## 2013-05-05 DIAGNOSIS — K5900 Constipation, unspecified: Secondary | ICD-10-CM

## 2013-05-05 DIAGNOSIS — R195 Other fecal abnormalities: Secondary | ICD-10-CM

## 2013-05-05 DIAGNOSIS — R109 Unspecified abdominal pain: Secondary | ICD-10-CM

## 2013-05-05 HISTORY — DX: Unspecified abdominal pain: R10.9

## 2013-05-05 HISTORY — DX: Other fecal abnormalities: R19.5

## 2013-05-05 HISTORY — DX: Constipation, unspecified: K59.00

## 2013-05-05 LAB — CBC
HEMATOCRIT: 36.9 % (ref 36.0–46.0)
HEMOGLOBIN: 12.5 g/dL (ref 12.0–15.0)
MCH: 30.1 pg (ref 26.0–34.0)
MCHC: 33.9 g/dL (ref 30.0–36.0)
MCV: 88.9 fL (ref 78.0–100.0)
Platelets: 321 10*3/uL (ref 150–400)
RBC: 4.15 MIL/uL (ref 3.87–5.11)
RDW: 13.7 % (ref 11.5–15.5)
WBC: 7.1 10*3/uL (ref 4.0–10.5)

## 2013-05-05 LAB — HEMOCCULT GUIAC POC 1CARD (OFFICE): Fecal Occult Blood, POC: NEGATIVE

## 2013-05-05 MED ORDER — CIPROFLOXACIN HCL 500 MG PO TABS: 500.0000 mg | ORAL_TABLET | Freq: Two times a day (BID) | ORAL | Status: DC

## 2013-05-05 MED ORDER — METRONIDAZOLE 500 MG PO TABS: 500.0000 mg | ORAL_TABLET | Freq: Two times a day (BID) | ORAL | Status: DC

## 2013-05-05 NOTE — Telephone Encounter (Signed)
Pt coming in at 4pm.

## 2013-05-05 NOTE — Progress Notes (Signed)
Subjective:     Patient ID: Deborah Lucero, female   DOB: [birth year], 53 y.o.   MRN: [id number]  HPI Deborah Lucero is a 53 year old white female complaining of abdominal pain and constipation and then watery yellow stools.This all started a week ago and she thought she had GI BUG, she took citrate of magnesium, MOM, and a fleets enema  And supp. And still felt like she needed to have BM, she vomited x 1, no fever.She called earlier in week and I refereed her to Dr Laural Golden, but she called and wanted to be seen today, so she was worked in.Has ? IBS, she thinks.  Review of Systems See HPI Reviewed past medical,surgical, social and family history. Reviewed medications and allergies.     Objective:   Physical Exam BP 102/58  Ht _0  (1.651 m)  Wt 166 lb (75.297 kg)  BMI 27.62 kg/m2abdomin soft, no masses felt, has BS in all 4 quadrants, no rebound tenderness noted, on rectal has good sphincter tone, has internal hemorrhoids, no impaction felt, hemoccult negative.  Discussed with [name].  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

## 2013-05-05 NOTE — Patient Instructions (Addendum)
Diet and Irritable Bowel Syndrome  No cure has been found for irritable bowel syndrome (IBS). Many options are available to treat the symptoms. Your caregiver will give you the best treatments available for your symptoms. He or she will also encourage you to manage stress and to make changes to your diet. You need to work with your caregiver and Registered Dietician to find the best combination of medicine, diet, counseling, and support to control your symptoms. The following are some diet suggestions. FOODS THAT MAKE IBS WORSE  Fatty foods, such as [place] fries.  Milk products, such as cheese or ice cream.  Chocolate.  Alcohol.  Caffeine (found in coffee and some sodas).  Carbonated drinks, such as soda. If certain foods cause symptoms, you should eat less of them or stop eating them. FOOD JOURNAL   Keep a journal of the foods that seem to cause distress. Write down:  What you are eating during the day and when.  What problems you are having after eating.  When the symptoms occur in relation to your meals.  What foods always make you feel badly.  Take your notes with you to your caregiver to see if you should stop eating certain foods. FOODS THAT MAKE IBS BETTER Fiber reduces IBS symptoms, especially constipation, because it makes stools soft, bulky, and easier to pass. Fiber is found in bran, bread, cereal, beans, fruit, and vegetables. Examples of foods with fiber include:  Apples.  Peaches.  Pears.  Berries.  Figs.  Broccoli, raw.  Cabbage.  Carrots.  Raw peas.  Kidney beans.  Lima beans.  Whole-grain bread.  Whole-grain cereal. Add foods with fiber to your diet a little at a time. This will let your body get used to them. Too much fiber at once might cause gas and swelling of your abdomen. This can trigger symptoms in a person with IBS. Caregivers usually recommend a diet with enough fiber to produce soft, painless bowel movements. High fiber diets may  cause gas and bloating. However, these symptoms often go away within a few weeks, as your body adjusts. In many cases, dietary fiber may lessen IBS symptoms, particularly constipation. However, it may not help pain or diarrhea. High fiber diets keep the colon mildly enlarged (distended) with the added fiber. This may help prevent spasms in the colon. Some forms of fiber also keep water in the stool, thereby preventing hard stools that are difficult to pass.  Besides telling you to eat more foods with fiber, your caregiver may also tell you to get more fiber by taking a fiber pill or drinking water mixed with a special high fiber powder. An example of this is a natural fiber laxative containing psyllium seed.  TIPS  Large meals can cause cramping and diarrhea in people with IBS. If this happens to you, try eating 4 or 5 small meals a day, or try eating less at each of your usual 3 meals. It may also help if your meals are low in fat and high in carbohydrates. Examples of carbohydrates are pasta, rice, whole-grain breads and cereals, fruits, and vegetables.  If dairy products cause your symptoms to flare up, you can try eating less of those foods. You might be able to handle yogurt better than other dairy products, because it contains bacteria that helps with digestion. Dairy products are an important source of calcium and other nutrients. If you need to avoid dairy products, be sure to talk with a Registered Dietitian about getting these nutrients  through other food sources.  Drink enough water and fluids to keep your urine clear or pale yellow. This is important, especially if you have diarrhea. FOR MORE INFORMATION  International Foundation for Functional Gastrointestinal Disorders: www.iffgd.org  National Digestive Diseases Information Clearinghouse: digestive.StageSync.siniddk.nih.gov Document Released: 05/10/2003 Document Revised: 05/12/2011 Document Reviewed: 01/25/2007 [facility] Patient Information 2014  [place]. Constipation, Adult Constipation is when a person has fewer than 3 bowel movements a week; has difficulty having a bowel movement; or has stools that are dry, hard, or larger than normal. As people grow older, constipation is more common. If you try to fix constipation with medicines that make you have a bowel movement (laxatives), the problem may get worse. Long-term laxative use may cause the muscles of the colon to become weak. A low-fiber diet, not taking in enough fluids, and taking certain medicines may make constipation worse. CAUSES   Certain medicines, such as antidepressants, pain medicine, iron supplements, antacids, and water pills.   Certain diseases, such as diabetes, irritable bowel syndrome (IBS), thyroid disease, or depression.   Not drinking enough water.   Not eating enough fiber-rich foods.   Stress or travel.  Lack of physical activity or exercise.  Not going to the restroom when there is the urge to have a bowel movement.  Ignoring the urge to have a bowel movement.  Using laxatives too much. SYMPTOMS   Having fewer than 3 bowel movements a week.   Straining to have a bowel movement.   Having hard, dry, or larger than normal stools.   Feeling full or bloated.   Pain in the lower abdomen.  Not feeling relief after having a bowel movement. DIAGNOSIS  Your caregiver will take a medical history and perform a physical exam. Further testing may be done for severe constipation. Some tests may include:   A barium enema X-ray to examine your rectum, colon, and sometimes, your small intestine.  A sigmoidoscopy to examine your lower colon.  A colonoscopy to examine your entire colon. TREATMENT  Treatment will depend on the severity of your constipation and what is causing it. Some dietary treatments include drinking more fluids and eating more fiber-rich foods. Lifestyle treatments may include regular exercise. If these diet and lifestyle  recommendations do not help, your caregiver may recommend taking over-the-counter laxative medicines to help you have bowel movements. Prescription medicines may be prescribed if over-the-counter medicines do not work.  HOME CARE INSTRUCTIONS   Increase dietary fiber in your diet, such as fruits, vegetables, whole grains, and beans. Limit high-fat and processed sugars in your diet, such as [place] fries, hamburgers, cookies, candies, and soda.   A fiber supplement may be added to your diet if you cannot get enough fiber from foods.   Drink enough fluids to keep your urine clear or pale yellow.   Exercise regularly or as directed by your caregiver.   Go to the restroom when you have the urge to go. Do not hold it.  Only take medicines as directed by your caregiver. Do not take other medicines for constipation without talking to your caregiver first. SEEK IMMEDIATE MEDICAL CARE IF:   You have bright red blood in your stool.   Your constipation lasts for more than 4 days or gets worse.   You have abdominal or rectal pain.   You have thin, pencil-like stools.  You have unexplained weight loss. MAKE SURE YOU:   Understand these instructions.  Will watch your condition.  Will get help right away  if you are not doing well or get worse. Document Released: 11/16/2003 Document Revised: 05/12/2011 Document Reviewed: 11/29/2012 [facility] Patient Information 2014 [place], [place]. Abdominal Pain, Adult Many things can cause abdominal pain. Usually, abdominal pain is not caused by a disease and will improve without treatment. It can often be observed and treated at home. Your health care provider will do a physical exam and possibly order blood tests and X-rays to help determine the seriousness of your pain. However, in many cases, more time must pass before a clear cause of the pain can be found. Before that point, your health care provider may not know if you need more testing or further  treatment. HOME CARE INSTRUCTIONS  Monitor your abdominal pain for any changes. The following actions may help to alleviate any discomfort you are experiencing:  Only take over-the-counter or prescription medicines as directed by your health care provider.  Do not take laxatives unless directed to do so by your health care provider.  Try a clear liquid diet (broth, tea, or water) as directed by your health care provider. Slowly move to a bland diet as tolerated. SEEK MEDICAL CARE IF:  You have unexplained abdominal pain.  You have abdominal pain associated with nausea or diarrhea.  You have pain when you urinate or have a bowel movement.  You experience abdominal pain that wakes you in the night.  You have abdominal pain that is worsened or improved by eating food.  You have abdominal pain that is worsened with eating fatty foods. SEEK IMMEDIATE MEDICAL CARE IF:   Your pain does not go away within 2 hours.  You have a fever.  You keep throwing up (vomiting).  Your pain is felt only in portions of the abdomen, such as the right side or the left lower portion of the abdomen.  You pass bloody or black tarry stools. MAKE SURE YOU:  Understand these instructions.   Will watch your condition.   Will get help right away if you are not doing well or get worse.  Document Released: 11/27/2004 Document Revised: 12/08/2012 Document Reviewed: 10/27/2012 [facility] Patient Information 2014 [place], [place]. Follow up in 5 days CT 3/6 at 4:45 at [facility]

## 2013-05-06 ENCOUNTER — Telehealth: Payer: Self-pay | Admitting: Adult Health

## 2013-05-06 ENCOUNTER — Telehealth: Payer: Self-pay

## 2013-05-06 ENCOUNTER — Ambulatory Visit (HOSPITAL_COMMUNITY)
Admission: RE | Admit: 2013-05-06 | Discharge: 2013-05-06 | Disposition: A | Discharge status: Home / Self Care | Payer: BC Managed Care – PPO | Source: Ambulatory Visit | Attending: Adult Health | Admitting: Adult Health

## 2013-05-06 DIAGNOSIS — R9389 Abnormal findings on diagnostic imaging of other specified body structures: Secondary | ICD-10-CM | POA: Insufficient documentation

## 2013-05-06 DIAGNOSIS — R109 Unspecified abdominal pain: Secondary | ICD-10-CM

## 2013-05-06 DIAGNOSIS — R933 Abnormal findings on diagnostic imaging of other parts of digestive tract: Secondary | ICD-10-CM | POA: Insufficient documentation

## 2013-05-06 DIAGNOSIS — K5900 Constipation, unspecified: Secondary | ICD-10-CM | POA: Insufficient documentation

## 2013-05-06 LAB — COMPREHENSIVE METABOLIC PANEL
ALK PHOS: 40 U/L (ref 39–117)
ALT: 10 U/L (ref 0–35)
AST: 13 U/L (ref 0–37)
Albumin: 4.4 g/dL (ref 3.5–5.2)
BUN: 11 mg/dL (ref 6–23)
CO2: 28 mEq/L (ref 19–32)
CREATININE: 0.85 mg/dL (ref 0.50–1.10)
Calcium: 9.5 mg/dL (ref 8.4–10.5)
Chloride: 103 mEq/L (ref 96–112)
GLUCOSE: 78 mg/dL (ref 70–99)
Potassium: 4.6 mEq/L (ref 3.5–5.3)
Sodium: 137 mEq/L (ref 135–145)
Total Bilirubin: 0.3 mg/dL (ref 0.2–1.2)
Total Protein: 6.4 g/dL (ref 6.0–8.3)

## 2013-05-06 LAB — SEDIMENTATION RATE: Sed Rate: 5 mm/hr (ref 0–22)

## 2013-05-06 MED ORDER — IOHEXOL 300 MG/ML  SOLN
100.0000 mL | Freq: Once | INTRAMUSCULAR | Status: AC | PRN
  Administered 2013-05-06: 100 mL via INTRAVENOUS

## 2013-05-06 NOTE — Telephone Encounter (Signed)
Pt aware of CT take meds keep appt Tuesday,Iwill call Dr Karilyn Cotaehman Monday and discuss with him

## 2013-05-10 ENCOUNTER — Ambulatory Visit: Payer: BC Managed Care – PPO | Admitting: Adult Health

## 2013-05-10 ENCOUNTER — Encounter (INDEPENDENT_AMBULATORY_CARE_PROVIDER_SITE_OTHER): Payer: Self-pay | Admitting: *Deleted

## 2013-05-10 NOTE — Telephone Encounter (Signed)
Cyril MourningJennifer Griffin, NP spoke with pt concerning results for CT.

## 2013-05-25 ENCOUNTER — Ambulatory Visit (INDEPENDENT_AMBULATORY_CARE_PROVIDER_SITE_OTHER): Payer: BC Managed Care – PPO | Admitting: Internal Medicine

## 2013-07-16 ENCOUNTER — Other Ambulatory Visit: Payer: Self-pay | Admitting: Adult Health

## 2013-09-19 ENCOUNTER — Other Ambulatory Visit: Payer: Self-pay | Admitting: Adult Health

## 2013-09-21 ENCOUNTER — Other Ambulatory Visit: Payer: Self-pay | Admitting: Adult Health

## 2013-10-12 ENCOUNTER — Other Ambulatory Visit: Payer: Self-pay | Admitting: Adult Health

## 2013-10-19 ENCOUNTER — Other Ambulatory Visit: Payer: Self-pay | Admitting: Adult Health

## 2013-10-19 ENCOUNTER — Telehealth: Payer: Self-pay | Admitting: Adult Health

## 2013-10-19 NOTE — Telephone Encounter (Signed)
Refilled xanax

## 2013-10-19 NOTE — Telephone Encounter (Signed)
Spoke with pt. Pt is requesting a refill on Xanax 0.5 mg. She got #90 on 09/19/13. Pt states she is back in school and she is stressed. Please advise. Thanks!!! [employer]

## 2013-10-19 NOTE — Telephone Encounter (Signed)
Left message x 1. JSY 

## 2013-10-20 ENCOUNTER — Other Ambulatory Visit: Payer: Self-pay | Admitting: Adult Health

## 2013-12-21 ENCOUNTER — Other Ambulatory Visit: Payer: Self-pay | Admitting: Adult Health

## 2013-12-28 ENCOUNTER — Encounter (INDEPENDENT_AMBULATORY_CARE_PROVIDER_SITE_OTHER): Payer: Self-pay | Admitting: Internal Medicine

## 2013-12-28 ENCOUNTER — Ambulatory Visit (INDEPENDENT_AMBULATORY_CARE_PROVIDER_SITE_OTHER): Payer: BC Managed Care – PPO | Admitting: Internal Medicine

## 2013-12-28 ENCOUNTER — Other Ambulatory Visit (INDEPENDENT_AMBULATORY_CARE_PROVIDER_SITE_OTHER): Payer: Self-pay | Admitting: *Deleted

## 2013-12-28 ENCOUNTER — Telehealth (INDEPENDENT_AMBULATORY_CARE_PROVIDER_SITE_OTHER): Payer: Self-pay | Admitting: *Deleted

## 2013-12-28 VITALS — BP 122/58 | HR 64 | Temp 98.5°F | Ht 66.0 in | Wt 168.8 lb

## 2013-12-28 DIAGNOSIS — Z1211 Encounter for screening for malignant neoplasm of colon: Secondary | ICD-10-CM

## 2013-12-28 DIAGNOSIS — K5900 Constipation, unspecified: Secondary | ICD-10-CM

## 2013-12-28 DIAGNOSIS — K5909 Other constipation: Secondary | ICD-10-CM

## 2013-12-28 DIAGNOSIS — R1031 Right lower quadrant pain: Secondary | ICD-10-CM

## 2013-12-28 DIAGNOSIS — K219 Gastro-esophageal reflux disease without esophagitis: Secondary | ICD-10-CM | POA: Insufficient documentation

## 2013-12-28 NOTE — Progress Notes (Signed)
Subjective:    Patient ID: Deborah Lucero, female    DOB: [birth year], 53 y.o.   MRN: [id number]  HPI Referred to our office for pain just above the umbilicus and rt lower quadrant pain. She also has constipation. She says she has added flax seed  And fiber chewable to her diet. Also uses Senakot as needed. If she takes 4 Senakot she will have a BM.  Has tried Miralax in the past.  Underwent a CT  In March for this same pain and she says she has chronic appendicitis. Her last BM was today. Stool was normal in size. She usually has a BM every 3 days. No melena or BRRB.  Appetite is good.   No weight loss. She says she has a burning sensation in her lower rt quadrant. No fever. She is drinking more water. She has never undergone a colonoscopy in the past.  When she was in college in the 80s she underwent a sigmoidoscopy for weight loss and rectal bleeding which she says was normal by Dr. Karilyn Cotaehman.    05/16/2013 CT abdomen/pelvis with CM: constipation: IMPRESSION:  1. Stool burden does not appear excessive. No imaging findings to  suggest constipation.  2. The appendix is mildly dilated measuring 9 mm. Additionally, the  appendix does not fill with oral contrast material, despite the  presence of a large amount of oral contrast material in the cecum.  However, there are no overt surrounding inflammatory changes  adjacent to the appendix at this time. These findings are  nonspecific, and this may be indicative of chronic appendicitis in  this individual. However, clinical correlation is recommended to  exclude signs or symptoms of early appendicitis.  3. 4.1 x 2.8 x 4.4 cm low-intermediate attenuation right adnexal  lesion is presumably a mildly proteinaceous ovarian cyst. Followup  pelvic ultrasound examination in 6-12 months is recommended to  ensure the stability or resolution of this finding.  4. Additional incidental findings, as above.   Review of Systems     Past Medical  History  Diagnosis Date  . PONV (postoperative nausea and vomiting)   . Anxiety   . Abdominal pain 05/05/2013  . Constipation 05/05/2013  . Watery stools 05/05/2013    Past Surgical History  Procedure Laterality Date  . Breast surgery      lumpectomy right breast-benign  . Diagnostic laparoscopy    . Cesarean section    . Hysteroscopy with thermachoice  08/14/2011    Procedure: HYSTEROSCOPY WITH THERMACHOICE;  Surgeon: Lazaro ArmsLuther H Eure, MD;  Location: [facility];  Service: Gynecology;;  Thermachoice Endometrial Ablation Total Therapy Time=368min 44sec    No Known Allergies  Current Outpatient Prescriptions on File Prior to Visit  Medication Sig Dispense Refill  . ALPRAZolam (XANAX) 0.5 MG tablet TAKE ONE TABLET BY MOUTH EVERY 8 HOURS AS NEEDED FOR ANXIETY  90 tablet  0  . buPROPion (WELLBUTRIN SR) 150 MG 12 hr tablet TAKE ONE TABLET BY MOUTH EVERY DAY  90 tablet  3  . citalopram (CELEXA) 40 MG tablet TAKE ONE TABLET BY MOUTH EVERY DAY  90 tablet  3  . DiphenhydrAMINE HCl (BENADRYL ALLERGY PO) Take by mouth as needed.      . Pseudoephedrine HCl (SUDAFED PO) Take by mouth as needed.       No current facility-administered medications on file prior to visit.        Objective:   Physical Exam Filed Vitals:   12/28/13 1141  BP: 122/58  Pulse:  64  Temp: 98.5 F (36.9 C)  Height: 5\' 6"  (1.676 m)  Weight: 168 lb 12.8 oz (76.567 kg)   Alert and oriented. Skin warm and dry. Oral mucosa is moist.   . Sclera anicteric, conjunctivae is pink. Thyroid not enlarged. No cervical lymphadenopathy. Lungs clear. Heart regular rate and rhythm.  Abdomen is soft. Bowel sounds are positive. No hepatomegaly. No abdominal masses felt. No tenderness.  No edema to lower extremities.         Assessment & Plan:  Constipation: samples of Linzess 145mcg given to patient x 4 boxes Screening colonoscopy. If you run a fever and have rt lower quadrant pain, follow up with Dr. Lovell SheehanJenkins or go to the ED. GERD:  Continue the Nexium 30 minutes before breakfast.

## 2013-12-28 NOTE — Telephone Encounter (Signed)
Patient needs trilyte 

## 2013-12-28 NOTE — Patient Instructions (Signed)
Colonoscopy. The risks and benefits such as perforation, bleeding, and infection were reviewed with the patient and is agreeable. Samples of Linzess given to patient.

## 2013-12-29 MED ORDER — PEG 3350-KCL-NA BICARB-NACL 420 G PO SOLR
4000.0000 mL | Freq: Once | ORAL | Status: DC
Start: 2013-12-29 — End: 2015-10-04

## 2014-01-02 ENCOUNTER — Encounter (INDEPENDENT_AMBULATORY_CARE_PROVIDER_SITE_OTHER): Payer: Self-pay | Admitting: Internal Medicine

## 2014-01-23 ENCOUNTER — Telehealth (INDEPENDENT_AMBULATORY_CARE_PROVIDER_SITE_OTHER): Payer: Self-pay | Admitting: *Deleted

## 2014-01-23 NOTE — Telephone Encounter (Signed)
Patient left message on my voice mail and also called hospital to cancel TCS for this Wednesday and she will call when ready to get it back on schedule

## 2014-01-25 ENCOUNTER — Ambulatory Visit (HOSPITAL_COMMUNITY)
Admission: RE | Admit: 2014-01-25 | Payer: BC Managed Care – PPO | Source: Ambulatory Visit | Admitting: Internal Medicine

## 2014-01-25 ENCOUNTER — Encounter (HOSPITAL_COMMUNITY): Admission: RE | Payer: Self-pay | Source: Ambulatory Visit

## 2014-01-25 SURGERY — COLONOSCOPY
Anesthesia: Moderate Sedation

## 2014-01-31 NOTE — Telephone Encounter (Signed)
noted 

## 2014-02-03 ENCOUNTER — Other Ambulatory Visit: Payer: Self-pay | Admitting: Adult Health

## 2014-03-13 ENCOUNTER — Other Ambulatory Visit: Payer: Self-pay | Admitting: Adult Health

## 2014-05-08 ENCOUNTER — Other Ambulatory Visit: Payer: Self-pay | Admitting: Adult Health

## 2014-05-24 ENCOUNTER — Other Ambulatory Visit: Payer: Self-pay | Admitting: Adult Health

## 2014-05-26 ENCOUNTER — Other Ambulatory Visit: Payer: Self-pay | Admitting: Adult Health

## 2014-06-24 ENCOUNTER — Other Ambulatory Visit: Payer: Self-pay | Admitting: Adult Health

## 2014-07-18 ENCOUNTER — Encounter: Payer: Self-pay | Admitting: Women's Health

## 2014-07-18 ENCOUNTER — Ambulatory Visit (INDEPENDENT_AMBULATORY_CARE_PROVIDER_SITE_OTHER): Payer: BC Managed Care – PPO | Admitting: Women's Health

## 2014-07-18 ENCOUNTER — Other Ambulatory Visit (HOSPITAL_COMMUNITY)
Admission: RE | Admit: 2014-07-18 | Discharge: 2014-07-18 | Disposition: A | Discharge status: Home / Self Care | Payer: BC Managed Care – PPO | Source: Ambulatory Visit | Attending: Obstetrics & Gynecology | Admitting: Obstetrics & Gynecology

## 2014-07-18 VITALS — BP 116/72 | HR 68 | Ht 65.75 in | Wt 177.0 lb

## 2014-07-18 DIAGNOSIS — Z1151 Encounter for screening for human papillomavirus (HPV): Secondary | ICD-10-CM | POA: Diagnosis present

## 2014-07-18 DIAGNOSIS — Z01419 Encounter for gynecological examination (general) (routine) without abnormal findings: Secondary | ICD-10-CM | POA: Insufficient documentation

## 2014-07-18 NOTE — Patient Instructions (Signed)
Schedule mammogram and colonoscopy.

## 2014-07-18 NOTE — Progress Notes (Signed)
Patient ID: Deborah Lucero, female   DOB: 1960/12/28, 54 y.o.   MRN: [id number] Subjective:   Deborah Lucero is a 54 y.o. G1P1 Caucasian female here for a routine well-woman exam.  No LMP recorded. Patient has had an ablation.    Current complaints: anxiety, lots of situational stress- her 52yo sister has early-onset alzheimer's and she cares for her, husband has bad DM, has 15yo twins that just got learner's permit, is an Nurse, learning disabilityC teacher @ [employer]. Has been taking Xanax q hs to help w/ anxiety and sleep- states she just recently picked up rx- so doesn't need refill quite yet.  PCP: Dr. Orlie DakinFagain       Does desire labs, not currently fasting  Social History: Sexual: heterosexual Marital Status: married Living situation: with spouse Occupation: Runner, broadcasting/film/videoteacher @ [employer] Tobacco/alcohol: no tobacco, etoh-2x/wk- on weekends Illicit drugs: no history of illicit drug use  The following portions of the patient's history were reviewed and updated as appropriate: allergies, current medications, past family history, past medical history, past social history, past surgical history and problem list.  Past Medical History Past Medical History  Diagnosis Date  . PONV (postoperative nausea and vomiting)   . Anxiety   . Abdominal pain 05/05/2013  . Constipation 05/05/2013  . Watery stools 05/05/2013    Past Surgical History Past Surgical History  Procedure Laterality Date  . Breast surgery      lumpectomy right breast-benign  . Diagnostic laparoscopy    . Cesarean section    . Hysteroscopy with thermachoice  08/14/2011    Procedure: HYSTEROSCOPY WITH THERMACHOICE;  Surgeon: Lazaro ArmsLuther H Eure, MD;  Location: [facility];  Service: Gynecology;;  Thermachoice Endometrial Ablation Total Therapy Time=418min 44sec    Gynecologic History G1P1  No LMP recorded. Patient has had an ablation. Contraception: ablation Last Pap: 2014. Results were: abnormal neg pap w/ +HRHPV, normal colpo Last mammogram: 2013. Results were:  normal Last TCS: never  Obstetric History OB History  Gravida Para Term Preterm AB SAB TAB Ectopic Multiple Living  1 1       1 2     # Outcome Date GA Lbr Len/2nd Weight Sex Delivery Anes PTL Lv  1A Para 2001    F CS-LTranv   Y  1B Para 2001    M CS-LTranv   Y      Current Medications Current Outpatient Prescriptions on File Prior to Visit  Medication Sig Dispense Refill  . ALPRAZolam (XANAX) 0.5 MG tablet TAKE ONE TABLET BY MOUTH EVERY 8 HOURS AS NEEDED FOR ANXIETY 30 tablet 0  . citalopram (CELEXA) 40 MG tablet TAKE ONE TABLET BY MOUTH ONCE DAILY 90 tablet 4  . DiphenhydrAMINE HCl (BENADRYL ALLERGY PO) Take by mouth as needed.    . Pseudoephedrine HCl (SUDAFED PO) Take by mouth as needed.    Marland Kitchen. buPROPion (WELLBUTRIN SR) 150 MG 12 hr tablet TAKE ONE TABLET BY MOUTH EVERY DAY (Patient not taking: Reported on 07/18/2014) 90 tablet 3  . polyethylene glycol-electrolytes (NULYTELY/GOLYTELY) 420 G solution Take 4,000 mLs by mouth once. (Patient not taking: Reported on 07/18/2014) 4000 mL 0   No current facility-administered medications on file prior to visit.    Review of Systems Patient denies any headaches, blurred vision, shortness of breath, chest pain, abdominal pain, problems with bowel movements, urination, or intercourse.  Objective:  BP 116/72 mmHg  Pulse 68  Ht 5' 5.75" (1.67 m)  Wt 177 lb (80.287 kg)  BMI 28.79 kg/m2 Physical Exam  General:  Well  developed, well nourished, no acute distress. She is alert and oriented x3. Skin:  Warm and dry Neck:  Midline trachea, no thyromegaly or nodules Cardiovascular: Regular rate and rhythm, no murmur heard Lungs:  Effort normal, all lung fields clear to auscultation bilaterally Breasts:  No dominant palpable mass, retraction, or nipple discharge Abdomen:  Soft, non tender, no hepatosplenomegaly or masses Pelvic:  External genitalia is normal in appearance.  The vagina is normal in appearance. The cervix is bulbous, no CMT.  Thin  prep pap is done w/ HR HPV cotesting. Uterus is felt to be normal size, shape, and contour.  No adnexal masses or tenderness noted. Rectal: Good sphincter tone, no polyps, or hemorrhoids felt.  Hemoccult: neg Extremities:  No swelling or varicosities noted Psych:  She has a normal mood and affect  Assessment:   Healthy well-woman exam Anxiety, situational stress  Plan:  CBC, CMP, TSH, Lipid panel when fasting F/U 7564yr for physical, or sooner if needed Mammogram-need to schedule screening mammo now (past due) Colonoscopy-need to schedule screening TCS now (past due)  Have pharmacy send refill request for Xanax when due  Marge DuncansBooker, Kaynen Minner Randall CNM, [facility]NP-BC 07/18/2014 4:05 PM

## 2014-07-21 LAB — CYTOLOGY - PAP

## 2014-07-26 ENCOUNTER — Other Ambulatory Visit: Payer: Self-pay | Admitting: Adult Health

## 2014-07-26 MED ORDER — ALPRAZOLAM 0.5 MG PO TABS: 0.5000 mg | ORAL_TABLET | Freq: Three times a day (TID) | ORAL | Status: DC | PRN

## 2014-09-08 ENCOUNTER — Encounter: Payer: Self-pay | Admitting: Obstetrics & Gynecology

## 2014-09-08 ENCOUNTER — Ambulatory Visit (INDEPENDENT_AMBULATORY_CARE_PROVIDER_SITE_OTHER): Payer: BC Managed Care – PPO | Admitting: Obstetrics & Gynecology

## 2014-09-08 VITALS — BP 118/80 | HR 92 | Wt 165.3 lb

## 2014-09-08 DIAGNOSIS — F430 Acute stress reaction: Principal | ICD-10-CM

## 2014-09-08 DIAGNOSIS — F419 Anxiety disorder, unspecified: Secondary | ICD-10-CM | POA: Diagnosis not present

## 2014-09-08 DIAGNOSIS — F411 Generalized anxiety disorder: Secondary | ICD-10-CM

## 2014-09-08 MED ORDER — ZOLPIDEM TARTRATE 10 MG PO TABS: 10.0000 mg | ORAL_TABLET | Freq: Every evening | ORAL | Status: DC | PRN

## 2014-09-08 MED ORDER — ALPRAZOLAM 1 MG PO TABS: 1.0000 mg | ORAL_TABLET | Freq: Three times a day (TID) | ORAL | Status: DC | PRN

## 2014-09-08 NOTE — Progress Notes (Signed)
Patient ID: Deborah Lucero, female   DOB: 1960-04-12, 54 y.o.   MRN: [id number] Chief Complaint  Patient presents with  . anixtey    having panic attack.   Blood pressure 118/80, pulse 92, weight 165 lb 4.8 oz (74.98 kg).  I'm seeing the patient sent my acutely for a significant increase in her anxiety She is having relatively frequent essentially panic attacks She is taking Xanax half milligram 3 times a day and is on Wellbutrin and Celexa currently She does have an appointment [facility]  Deborah Lucero is having a lot of problems with her family [place] husband and her sister who is handling some unusual may be early onset dementia Additionally she lost her parents very close together just a couple years ago to some degree hasn't gotten over that We talked at great length regarding her kids her marriage her change in job which is also causing her great deal of stress in the unknown regarding her job going forward  She understands that she has some aspects of her handling and dealing with things it is unhealthy and she does want to get approved and addressed Really today we are trying to manage her in the short-term until she can seen at [facility]  I'm going to increase her Xanax at some Ambien because of significant insomnia and also encouraged her take melatonin 20 mg at night  Output space with her in 1 month which is approximately 2 weeks after she seen in [facility] and see how she is doing Going forward certain we they are better prepared and more suited to care for these issues  Specifically she has no suicidal thoughts or ideation  Xanax 1 mg TID  ambien 10 qhs  Seeing [facility] 7/22  Melatonin 20 mg qhs     Face to face time:  25 minutes  Greater than 50% of the visit time was spent in counseling and coordination of care with the patient.  The summary and outline of the counseling and care coordination is summarized in the note above.   All  questions were answered.

## 2014-10-10 ENCOUNTER — Ambulatory Visit: Payer: BC Managed Care – PPO | Admitting: Obstetrics & Gynecology

## 2014-10-10 ENCOUNTER — Encounter: Payer: Self-pay | Admitting: *Deleted

## 2014-12-02 ENCOUNTER — Encounter (HOSPITAL_COMMUNITY): Payer: Self-pay | Admitting: *Deleted

## 2014-12-02 ENCOUNTER — Emergency Department (HOSPITAL_COMMUNITY): Payer: BC Managed Care – PPO

## 2014-12-02 ENCOUNTER — Emergency Department (HOSPITAL_COMMUNITY)
Admission: EM | Admit: 2014-12-02 | Discharge: 2014-12-02 | Disposition: A | Discharge status: Home / Self Care | Payer: BC Managed Care – PPO | Attending: Emergency Medicine | Admitting: Emergency Medicine

## 2014-12-02 DIAGNOSIS — Z79899 Other long term (current) drug therapy: Secondary | ICD-10-CM | POA: Insufficient documentation

## 2014-12-02 DIAGNOSIS — F419 Anxiety disorder, unspecified: Secondary | ICD-10-CM | POA: Insufficient documentation

## 2014-12-02 DIAGNOSIS — K5900 Constipation, unspecified: Secondary | ICD-10-CM | POA: Insufficient documentation

## 2014-12-02 DIAGNOSIS — K047 Periapical abscess without sinus: Secondary | ICD-10-CM | POA: Insufficient documentation

## 2014-12-02 DIAGNOSIS — K0889 Other specified disorders of teeth and supporting structures: Secondary | ICD-10-CM | POA: Diagnosis present

## 2014-12-02 DIAGNOSIS — L03221 Cellulitis of neck: Secondary | ICD-10-CM | POA: Insufficient documentation

## 2014-12-02 DIAGNOSIS — Z792 Long term (current) use of antibiotics: Secondary | ICD-10-CM | POA: Insufficient documentation

## 2014-12-02 LAB — I-STAT CHEM 8, ED
BUN: 17 mg/dL (ref 6–20)
CREATININE: 1.8 mg/dL — AB (ref 0.44–1.00)
Calcium, Ion: 1.21 mmol/L (ref 1.12–1.23)
Chloride: 102 mmol/L (ref 101–111)
Glucose, Bld: 76 mg/dL (ref 65–99)
HEMATOCRIT: 36 % (ref 36.0–46.0)
HEMOGLOBIN: 12.2 g/dL (ref 12.0–15.0)
Potassium: 4.2 mmol/L (ref 3.5–5.1)
SODIUM: 133 mmol/L — AB (ref 135–145)
TCO2: 21 mmol/L (ref 0–100)

## 2014-12-02 MED ORDER — ONDANSETRON HCL 4 MG/2ML IJ SOLN
4.0000 mg | Freq: Once | INTRAMUSCULAR | Status: AC
  Administered 2014-12-02: 4 mg via INTRAVENOUS

## 2014-12-02 MED ORDER — AMPICILLIN-SULBACTAM SODIUM 3 (2-1) G IJ SOLR
3.0000 g | Freq: Once | INTRAMUSCULAR | Status: AC
  Administered 2014-12-02: 3 g via INTRAVENOUS
  Filled 2014-12-02: qty 3

## 2014-12-02 MED ORDER — ONDANSETRON HCL 4 MG/2ML IJ SOLN
4.0000 mg | Freq: Once | INTRAMUSCULAR | Status: DC
  Filled 2014-12-02: qty 2

## 2014-12-02 MED ORDER — CEPHALEXIN 500 MG PO CAPS: 500.0000 mg | ORAL_CAPSULE | Freq: Four times a day (QID) | ORAL | Status: DC

## 2014-12-02 MED ORDER — IOHEXOL 300 MG/ML  SOLN
75.0000 mL | Freq: Once | INTRAMUSCULAR | Status: AC | PRN
  Administered 2014-12-02: 75 mL via INTRAVENOUS

## 2014-12-02 MED ORDER — MORPHINE SULFATE (PF) 4 MG/ML IV SOLN
4.0000 mg | Freq: Once | INTRAVENOUS | Status: AC
  Administered 2014-12-02: 4 mg via INTRAVENOUS
  Filled 2014-12-02: qty 1

## 2014-12-02 MED ORDER — HYDROCODONE-ACETAMINOPHEN 5-325 MG PO TABS: 1.0000 | ORAL_TABLET | ORAL | Status: DC | PRN

## 2014-12-02 MED ORDER — HYDROCODONE-ACETAMINOPHEN 5-325 MG PO TABS
1.0000 | ORAL_TABLET | Freq: Once | ORAL | Status: AC
  Administered 2014-12-02: 1 via ORAL
  Filled 2014-12-02: qty 1

## 2014-12-02 NOTE — ED Notes (Signed)
Pt transferred to room 19.

## 2014-12-02 NOTE — ED Notes (Signed)
Pt got root canal on Tuesday and has had progressive increase in pain and swelling since then. Pt went to dentist yesterday and was given clindamycin which she has been taking but was told is swelling increases to come to ER. Pt has no shortness of breath upon triage.

## 2014-12-02 NOTE — Discharge Instructions (Signed)
Cellulitis Cellulitis is an infection of the skin and the tissue beneath it. The infected area is usually red and tender. Cellulitis occurs most often in the arms and lower legs.  CAUSES  Cellulitis is caused by bacteria that enter the skin through cracks or cuts in the skin. The most common types of bacteria that cause cellulitis are staphylococci and streptococci. SIGNS AND SYMPTOMS   Redness and warmth.  Swelling.  Tenderness or pain.  Fever. DIAGNOSIS  Your health care provider can usually determine what is wrong based on a physical exam. Blood tests may also be done. TREATMENT  Treatment usually involves taking an antibiotic medicine. HOME CARE INSTRUCTIONS   Take your antibiotic medicine as directed by your health care provider. Finish the antibiotic even if you start to feel better.  Keep the infected arm or leg elevated to reduce swelling.  Apply a warm cloth to the affected area up to 4 times per day to relieve pain.  Take medicines only as directed by your health care provider.  Keep all follow-up visits as directed by your health care provider. SEEK MEDICAL CARE IF:   You notice red streaks coming from the infected area.  Your red area gets larger or turns dark in color.  Your bone or joint underneath the infected area becomes painful after the skin has healed.  Your infection returns in the same area or another area.  You notice a swollen bump in the infected area.  You develop new symptoms.  You have a fever. SEEK IMMEDIATE MEDICAL CARE IF:   You feel very sleepy.  You develop vomiting or diarrhea.  You have a general ill feeling (malaise) with muscle aches and pains. MAKE SURE YOU:   Understand these instructions.  Will watch your condition.  Will get help right away if you are not doing well or get worse. Document Released: 11/27/2004 Document Revised: 07/04/2013 Document Reviewed: 05/05/2011 [facility] Patient Information 2015 [place], [place].  This information is not intended to replace advice given to you by your health care provider. Make sure you discuss any questions you have with your health care provider.   Continue taking your clindamycin.  Add the Keflex which has been prescribed to you today.  You may take hydrocodone in place of your Tylenol 3 which may give better pain relief.  Do not drive within 4 hours of taking this medication.  As discussed, Dr. Nicholes Rough with like to see you in her office tomorrow if your symptoms are improving or no worse than today.  Please call her at 3 PM tomorrow and she will arrange to see you in her office.  If you feel your symptoms are worsening in any way including increased pain, swelling, fevers or any new symptoms she would like you to proceed directly to [facility] for a recheck and further management of your infection.

## 2014-12-04 NOTE — ED Provider Notes (Signed)
CSN: [id number]     Arrival date & time 12/02/14  1218 History   First MD Initiated Contact with Patient 12/02/14 1250     Chief Complaint  Patient presents with  . Dental Pain     (Consider location/radiation/quality/duration/timing/severity/associated sxs/prior Treatment) The history is provided by the patient.   Deborah Lucero is a 54 y.o. female presenting for evaluation of worsening pain and jaw and neck swelling since having a root canal 4 days ago by her dentist Dr. Orvan Falconer in [place].  She was seen by her dentist yesterday morning and was placed on clindamycin, so far has had 6 doses with now worsened symptoms.  She denies fevers or chills, but reports sensation of neck and throat fullness with difficulty swallowing at this time secondary to pain.  She called the dental office today and spoke with Dr. Nicholes Rough who advised her to go to [facility] where there is a dentist on call and available.  Unfortunately due to transportation restraints was unable to get there today. She is taking hydrocodone which is relieving her pain somewhat.     Past Medical History  Diagnosis Date  . PONV (postoperative nausea and vomiting)   . Anxiety   . Abdominal pain 05/05/2013  . Constipation 05/05/2013  . Watery stools 05/05/2013   Past Surgical History  Procedure Laterality Date  . Breast surgery      lumpectomy right breast-benign  . Diagnostic laparoscopy    . Cesarean section    . Hysteroscopy with thermachoice  08/14/2011    Procedure: HYSTEROSCOPY WITH THERMACHOICE;  Surgeon: Lazaro Arms, MD;  Location: [facility];  Service: Gynecology;;  Thermachoice Endometrial Ablation Total Therapy Time=48min 44sec   Family History  Problem Relation Age of Onset  . Alzheimer's disease Father   . Alzheimer's disease Sister    Social History  Substance Use Topics  . Smoking status: Never Smoker   . Smokeless tobacco: Never Used  . Alcohol Use: 1.2 oz/week    2 Glasses of wine per week       Comment: twice weekly   OB History    Gravida Para Term Preterm AB TAB SAB Ectopic Multiple Living   Review of Systems  Constitutional: Negative for fever and chills.  HENT: Positive for dental problem, facial swelling and sore throat.   Respiratory: Negative for shortness of breath, wheezing and stridor.   Musculoskeletal: Negative for neck pain and neck stiffness.      Allergies  Review of patient's allergies indicates no known allergies.  Home Medications   Prior to Admission medications   Medication Sig Start Date End Date Taking? Authorizing Provider  ALPRAZolam Prudy Feeler) 1 MG tablet Take 1 tablet (1 mg total) by mouth 3 (three) times daily as needed. for anxiety 09/08/14  Yes Lazaro Arms, MD  buPROPion [facility] SR) 150 MG 12 hr tablet TAKE ONE TABLET BY MOUTH EVERY DAY 10/12/13  Yes Adline Potter, NP  citalopram (CELEXA) 40 MG tablet TAKE ONE TABLET BY MOUTH ONCE DAILY 05/08/14  Yes Adline Potter, NP  clindamycin (CLEOCIN) 150 MG capsule Take 150 mg by mouth 4 (four) times daily.   Yes Historical Provider, MD  ibuprofen (ADVIL,MOTRIN) 800 MG tablet Take 800 mg by mouth every 6 (six) hours as needed.   Yes Historical Provider, MD  magnesium hydroxide (MILK OF MAGNESIA) 400 MG/5ML suspension Take 30 mLs by mouth daily as needed  for mild constipation.   Yes Historical Provider, MD  Pseudoephedrine HCl (SUDAFED PO) Take by mouth as needed.   Yes Historical Provider, MD  cephALEXin (KEFLEX) 500 MG capsule Take 1 capsule (500 mg total) by mouth 4 (four) times daily. 12/02/14   Burgess Amor, PA-C  HYDROcodone-acetaminophen (NORCO/VICODIN) 5-325 MG tablet Take 1 tablet by mouth every 4 (four) hours as needed. 12/02/14   Burgess Amor, PA-C  polyethylene glycol-electrolytes (NULYTELY/GOLYTELY) 420 G solution Take 4,000 mLs by mouth once. Patient not taking: Reported on 07/18/2014 12/29/13   Len Blalock, NP  zolpidem (AMBIEN) 10 MG tablet Take 1 tablet (10 mg  total) by mouth at bedtime as needed for sleep. Patient not taking: Reported on 12/02/2014 09/08/14 12/02/14  Lazaro Arms, MD   BP 142/86 mmHg  Pulse 92  Temp(Src) 97.5 F (36.4 C) (Oral)  Resp 18  Ht  (1.651 m)  Wt 150 lb (68.04 kg)  BMI 24.96 kg/m2  SpO2 96% Physical Exam  Constitutional: She is oriented to person, place, and time. She appears well-developed and well-nourished. No distress.  HENT:  Head: Normocephalic and atraumatic.  Right Ear: Tympanic membrane and external ear normal.  Left Ear: Tympanic membrane and external ear normal.  Nose: Nose normal.  Mouth/Throat: Oropharynx is clear and moist and mucous membranes are normal. No oral lesions. No trismus in the jaw. No dental abscesses.    Eyes: Conjunctivae are normal.  Neck: Normal range of motion. Neck supple.  Cardiovascular: Normal rate and normal heart sounds.   Pulmonary/Chest: Effort normal.  Abdominal: She exhibits no distension.  Musculoskeletal: Normal range of motion.  Lymphadenopathy:       Head (left side): Submandibular adenopathy present.    She has no cervical adenopathy.  Neurological: She is alert and oriented to person, place, and time.  Skin: Skin is warm and dry. No erythema.  Psychiatric: She has a normal mood and affect.    ED Course  Procedures (including critical care time) Labs Review Labs Reviewed  I-STAT CHEM 8, ED - Abnormal; Notable for the following:    Sodium 133 (*)    Creatinine, Ser 1.80 (*)    All other components within normal limits    Imaging Review  CLINICAL DATA: Root canal 4 days ago. Pain and swelling left mandible and neck.  EXAM: CT NECK WITH CONTRAST  TECHNIQUE: Multidetector CT imaging of the neck was performed using the standard protocol following the bolus administration of intravenous contrast.  CONTRAST: 75mL OMNIPAQUE IOHEXOL 300 MG/ML SOLN  COMPARISON: None.  FINDINGS: Soft tissue swelling medial and inferior to the mandible at  the level of the molar. No abscess. There has been root canal of the left lower second molar. Third molar has been removed. There is soft tissue edema and thickening of the platysmas muscle. There is a 12 mm left submandibular lymph node and small submental lymph nodes on the left due to infection.  Pharynx and larynx: Negative  Salivary glands: There is inflammation surrounding the left submandibular gland which is felt to be dental in origin. The left submandibular gland itself is normal. The right submandibular gland is normal. Parotid gland normal bilaterally.  Thyroid: Negative  Lymph nodes: Prominent left submandibular nodes measuring up to 12 mm compatible with dental infection.  Vascular: Carotid artery and jugular vein patent bilaterally.  Limited intracranial: Negative  Visualized orbits: Negative  Mastoids and visualized paranasal sinuses: Negative  Skeleton: Mild lucency around the roots of the left lower  second molar at the site of recent root canal. This may be due to mild infection.  Upper chest: Negative  IMPRESSION: Root canal left lower second molar. Mild lucency around the roots suggesting mild periapical infection. There is edema/ cellulitis in the soft tissues adjacent to the mandible in this area without evidence of abscess. Scattered lymph nodes in the left submandibular area due to dental infection.   Electronically Signed By: Marlan Palau M.D. On: 12/02/2014 14:56        I have personally reviewed and evaluated these images and lab results as part of my medical decision-making.   EKG Interpretation None      MDM   Final diagnoses:  Dental infection  Cellulitis of neck    Pt was given IV fluids, morphine with improved discomfort.  Spoke with Dr. Nicholes Rough regarding results.  Plan for pt to see her in the office in 1 day if her sx are not worsened (pt to call her at 3 pm for update and to arrange the visit).  IF her sx are  worse,  She is to proceed to [facility].  Pt was given a dose of unasyn while here and added keflex to her abx regimen, advised to complete her clindamycin.    The patient appears reasonably screened and/or stabilized for discharge and I doubt any other medical condition or other [facility] requiring further screening, evaluation, or treatment in the ED at this time prior to discharge.     Burgess Amor, PA-C 12/04/14 2200  Bethann Berkshire, MD 12/05/14 [phone]

## 2015-03-12 ENCOUNTER — Other Ambulatory Visit: Payer: Self-pay | Admitting: Obstetrics & Gynecology

## 2015-04-30 ENCOUNTER — Telehealth (INDEPENDENT_AMBULATORY_CARE_PROVIDER_SITE_OTHER): Payer: Self-pay | Admitting: Internal Medicine

## 2015-04-30 DIAGNOSIS — K5909 Other constipation: Secondary | ICD-10-CM

## 2015-04-30 MED ORDER — LINACLOTIDE 145 MCG PO CAPS: 145.0000 ug | ORAL_CAPSULE | Freq: Every day | ORAL | Status: DC

## 2015-04-30 NOTE — Telephone Encounter (Signed)
Rx for linzess sent to her pharmacy 

## 2015-04-30 NOTE — Telephone Encounter (Signed)
[name] left a message saying when she was initially seen, she was diagnosed with Appendicitis and IBS. Due to having Appendicitis, the samples and Rx of Linzess given by Terri didn't work. After her Appendicitis was no longer an issue, she said Linzess began working. She'd like a Rx sent to her pharmacy if possible [facility]) for Linzess. Please call the patient if needed.  Pt's ph# [phone]  Thank you.

## 2015-04-30 NOTE — Telephone Encounter (Signed)
Rx sent to her pharmacy 

## 2015-05-01 NOTE — Telephone Encounter (Signed)
Rx sent to her pharmacy 

## 2015-05-09 ENCOUNTER — Other Ambulatory Visit: Payer: Self-pay | Admitting: *Deleted

## 2015-06-22 ENCOUNTER — Other Ambulatory Visit: Payer: Self-pay | Admitting: *Deleted

## 2015-06-25 ENCOUNTER — Other Ambulatory Visit: Payer: Self-pay | Admitting: *Deleted

## 2015-06-25 MED ORDER — BUPROPION HCL ER (XL) 150 MG PO TB24: ORAL_TABLET | ORAL | Status: DC

## 2015-08-05 ENCOUNTER — Other Ambulatory Visit: Payer: Self-pay | Admitting: Adult Health

## 2015-08-09 ENCOUNTER — Other Ambulatory Visit: Payer: Self-pay | Admitting: Obstetrics & Gynecology

## 2015-10-01 ENCOUNTER — Telehealth: Payer: Self-pay | Admitting: Adult Health

## 2015-10-01 NOTE — Telephone Encounter (Signed)
Deborah Lucero is on way back from [place], she went there last week to see best friend, after being overwhelmed with caring for sister who has dementia and break up of marriage.She forgot her xanax and had bad panic attack and the fell while walking and has blackened eyes.she went to ER and was given xanax there, but she wants to wean off, so she says she takes maybe 2 a day regularly but not 3, so take whole one in am and whole one in pm then in 2 days take am dose and 1/2 pm dose for several days and we will talk to see how see feels and try to wean her on down.

## 2015-10-04 ENCOUNTER — Encounter: Payer: Self-pay | Admitting: Adult Health

## 2015-10-04 ENCOUNTER — Ambulatory Visit (INDEPENDENT_AMBULATORY_CARE_PROVIDER_SITE_OTHER): Payer: BC Managed Care – PPO | Admitting: Adult Health

## 2015-10-04 VITALS — BP 130/82 | HR 84 | Ht 65.0 in | Wt 149.0 lb

## 2015-10-04 DIAGNOSIS — Z139 Encounter for screening, unspecified: Secondary | ICD-10-CM

## 2015-10-04 DIAGNOSIS — F32A Depression, unspecified: Secondary | ICD-10-CM

## 2015-10-04 DIAGNOSIS — F418 Other specified anxiety disorders: Secondary | ICD-10-CM | POA: Diagnosis not present

## 2015-10-04 DIAGNOSIS — F329 Major depressive disorder, single episode, unspecified: Secondary | ICD-10-CM | POA: Diagnosis not present

## 2015-10-04 DIAGNOSIS — F419 Anxiety disorder, unspecified: Secondary | ICD-10-CM

## 2015-10-04 HISTORY — DX: Anxiety disorder, unspecified: F41.9

## 2015-10-04 HISTORY — DX: Depression, unspecified: F32.A

## 2015-10-04 MED ORDER — ALPRAZOLAM 0.5 MG PO TABS: ORAL_TABLET | ORAL | 0 refills | Status: DC

## 2015-10-04 NOTE — Patient Instructions (Signed)
See psychiatrist  Wean down xanax .5 mg 4 x daily  If feels worse go to ER  Follow up in 3 weeks

## 2015-10-04 NOTE — Progress Notes (Signed)
Subjective:     Patient ID: Deborah Lucero, female   DOB: 08/11/1960, 55 y.o.   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 She says it is hard to focus at times.She is antsy and has had chest pain and it is heavy at times and not sleeping well.She denies any suicidal ideations.She has appt with therapist next week in [place] at [facility]. She is taking Celexa and Wellbutrin.  Review of Systems  Has anxiety and depression, feels antsy and chest is heavy and hurts, not sleeping well, not focused  Reviewed past medical,surgical, social and family history. Reviewed medications and allergies.     Objective:   Physical Exam BP 130/82 (BP Location: Left Arm, Patient Position: Sitting, Cuff Size: Normal)   Pulse 84   Ht 5\' 5"  (1.651 m)   Wt 149 lb (67.6 kg)   BMI 24.79 kg/m Talk only,PHQ 9 score 16. See HPI, discussed with Dr Despina Hidden, will decrease xanax to 0.5 mg qid #56 with no refills and get her to see psychiatrist again, for her to manage meds. Will check labs today, too. She has person staying with sister 6 hours per day now, but if sister calls she goes over, discussed may need to place her if not going well.    Face time about 30 minutes listening and counseling.   Assessment:     Anxiety and depression     Plan:    Check CBC,CMP,TSH and lipids,FSH and A1c and vitamin D,will talk when back  Continue celexa  and wellbutrin  Rx xanax 0.5 mg #56 take 1 qid no refills See psychiatrist next week If feels worse go to ER  Ok to take melatonin  Follow up in 3 weeks if needed

## 2015-10-05 LAB — CBC
HEMATOCRIT: 38 % (ref 34.0–46.6)
HEMOGLOBIN: 13 g/dL (ref 11.1–15.9)
MCH: 31.1 pg (ref 26.6–33.0)
MCHC: 34.2 g/dL (ref 31.5–35.7)
MCV: 91 fL (ref 79–97)
Platelets: 400 10*3/uL — ABNORMAL HIGH (ref 150–379)
RBC: 4.18 x10E6/uL (ref 3.77–5.28)
RDW: 14.4 % (ref 12.3–15.4)
WBC: 7.1 10*3/uL (ref 3.4–10.8)

## 2015-10-05 LAB — COMPREHENSIVE METABOLIC PANEL
ALBUMIN: 4.4 g/dL (ref 3.5–5.5)
ALT: 14 IU/L (ref 0–32)
AST: 16 IU/L (ref 0–40)
Albumin/Globulin Ratio: 1.8 (ref 1.2–2.2)
Alkaline Phosphatase: 58 IU/L (ref 39–117)
BUN / CREAT RATIO: 9 (ref 9–23)
BUN: 8 mg/dL (ref 6–24)
Bilirubin Total: 0.3 mg/dL (ref 0.0–1.2)
CO2: 24 mmol/L (ref 18–29)
CREATININE: 0.85 mg/dL (ref 0.57–1.00)
Calcium: 9.9 mg/dL (ref 8.7–10.2)
Chloride: 99 mmol/L (ref 96–106)
GFR calc non Af Amer: 79 mL/min/{1.73_m2} (ref >59)
GFR, EST AFRICAN AMERICAN: 91 mL/min/{1.73_m2} (ref >59)
Globulin, Total: 2.5 g/dL (ref 1.5–4.5)
Glucose: 81 mg/dL (ref 65–99)
Potassium: 4.3 mmol/L (ref 3.5–5.2)
Sodium: 138 mmol/L (ref 134–144)
TOTAL PROTEIN: 6.9 g/dL (ref 6.0–8.5)

## 2015-10-05 LAB — LIPID PANEL
CHOL/HDL RATIO: 3.9 ratio (ref 0.0–4.4)
CHOLESTEROL TOTAL: 223 mg/dL — AB (ref 100–199)
HDL: 57 mg/dL (ref >39)
LDL CALC: 147 mg/dL — AB (ref 0–99)
TRIGLYCERIDES: 94 mg/dL (ref 0–149)
VLDL Cholesterol Cal: 19 mg/dL (ref 5–40)

## 2015-10-05 LAB — VITAMIN D 25 HYDROXY (VIT D DEFICIENCY, FRACTURES): Vit D, 25-Hydroxy: 38.4 ng/mL (ref 30.0–100.0)

## 2015-10-05 LAB — TSH: TSH: 3.21 u[IU]/mL (ref 0.450–4.500)

## 2015-10-05 LAB — HEMOGLOBIN A1C
Est. average glucose Bld gHb Est-mCnc: 97 mg/dL
Hgb A1c MFr Bld: 5 % (ref 4.8–5.6)

## 2015-10-05 LAB — FOLLICLE STIMULATING HORMONE: FSH: 51.3 m[IU]/mL

## 2015-10-08 ENCOUNTER — Telehealth: Payer: Self-pay | Admitting: Adult Health

## 2015-10-08 NOTE — Telephone Encounter (Signed)
Left message that labs looked good but FSH is elevated so you are menopausal

## 2015-10-24 ENCOUNTER — Ambulatory Visit: Payer: BC Managed Care – PPO | Admitting: Adult Health

## 2016-01-09 ENCOUNTER — Ambulatory Visit: Payer: BC Managed Care – PPO | Admitting: Adult Health

## 2016-01-15 ENCOUNTER — Other Ambulatory Visit (HOSPITAL_COMMUNITY): Payer: Self-pay | Admitting: Internal Medicine

## 2016-01-15 DIAGNOSIS — R109 Unspecified abdominal pain: Secondary | ICD-10-CM

## 2016-01-18 ENCOUNTER — Ambulatory Visit (HOSPITAL_COMMUNITY)
Admission: RE | Admit: 2016-01-18 | Discharge: 2016-01-18 | Disposition: A | Discharge status: Home / Self Care | Payer: BC Managed Care – PPO | Source: Ambulatory Visit | Attending: Internal Medicine | Admitting: Internal Medicine

## 2016-01-18 DIAGNOSIS — R109 Unspecified abdominal pain: Secondary | ICD-10-CM

## 2016-02-28 ENCOUNTER — Encounter (INDEPENDENT_AMBULATORY_CARE_PROVIDER_SITE_OTHER): Payer: Self-pay | Admitting: Internal Medicine

## 2016-03-20 ENCOUNTER — Ambulatory Visit (INDEPENDENT_AMBULATORY_CARE_PROVIDER_SITE_OTHER): Payer: BC Managed Care – PPO | Admitting: Internal Medicine

## 2016-04-16 ENCOUNTER — Encounter: Payer: Self-pay | Admitting: Adult Health

## 2016-04-16 ENCOUNTER — Ambulatory Visit (INDEPENDENT_AMBULATORY_CARE_PROVIDER_SITE_OTHER): Payer: BC Managed Care – PPO | Admitting: Adult Health

## 2016-04-16 VITALS — BP 120/80 | HR 76 | Ht 65.0 in | Wt 154.0 lb

## 2016-04-16 DIAGNOSIS — F329 Major depressive disorder, single episode, unspecified: Secondary | ICD-10-CM

## 2016-04-16 DIAGNOSIS — F419 Anxiety disorder, unspecified: Secondary | ICD-10-CM

## 2016-04-16 DIAGNOSIS — F418 Other specified anxiety disorders: Secondary | ICD-10-CM | POA: Diagnosis not present

## 2016-04-16 DIAGNOSIS — Z780 Asymptomatic menopausal state: Secondary | ICD-10-CM | POA: Diagnosis not present

## 2016-04-16 DIAGNOSIS — R232 Flushing: Secondary | ICD-10-CM | POA: Diagnosis not present

## 2016-04-16 MED ORDER — CONJ ESTROGENS-BAZEDOXIFENE 0.45-20 MG PO TABS: ORAL_TABLET | ORAL | 0 refills | Status: DC

## 2016-04-16 NOTE — Progress Notes (Signed)
Subjective:     Patient ID: Cecille AverLisa Burton-Bennett, female   DOB: 01-Jul-1960, 56 y.o.   MRN: [id number]  HPI [name] is a 56 year old white female, divorced in complaining of hot flashes, not sleeping, anxious and stressed, feels like in a fog, can't concentrated.Sister has dementia and she over sees her care, and has 2 teenagers.  PCP Melony Overlyeresa Hurst, PA at [facility].   Review of Systems +hot flashes Can't sleep Anxious, stressed, in fog, can;t concentrate  Reviewed past medical,surgical, social and family history. Reviewed medications and allergies.     Objective:   Physical Exam BP 120/80 (BP Location: Left Arm, Patient Position: Sitting, Cuff Size: Normal)   Pulse 76   Ht 5\' 5"  (1.651 m)   Wt 154 lb (69.9 kg)   BMI 25.63 kg/m    Skin warm and dry. Lungs: clear to ausculation bilaterally. Cardiovascular: regular rate and rhythm. PHQ 9 22, on meds, denies suicidal ideations, sees Melony Overlyeresa Hurst and a counselor there.May benefit from psychiatrist, but she declines for now.She has had meds changed recently. Discussed talking with her about meds, and will try HRT, aware of risks and benefits. Face time 15 minutes with 50% counseling as above.  Assessment:     1. Hot flashes   2. Anxiety and depression   3. Menopause       Plan:     Call Melony Overlyeresa Hurst PA and discuss meds Given 56 tabs of Duavee to try start today, lot [id number] exp 4/18  Follow up in 3 weeks

## 2016-05-07 ENCOUNTER — Ambulatory Visit: Payer: BC Managed Care – PPO | Admitting: Adult Health

## 2016-05-07 ENCOUNTER — Encounter: Payer: Self-pay | Admitting: *Deleted

## 2016-06-09 ENCOUNTER — Telehealth: Payer: Self-pay | Admitting: Adult Health

## 2016-06-09 MED ORDER — CONJ ESTROGENS-BAZEDOXIFENE 0.45-20 MG PO TABS: ORAL_TABLET | ORAL | 6 refills | Status: DC

## 2016-06-09 NOTE — Telephone Encounter (Signed)
Left message that RX sent for duavee to [facility], and that hope things are going well

## 2016-06-18 ENCOUNTER — Telehealth: Payer: Self-pay | Admitting: *Deleted

## 2016-06-18 NOTE — Telephone Encounter (Signed)
Left message on Pt's VM that RX was sent to her pharmacy on 06/09/16 for her hormone pills.

## 2016-10-02 ENCOUNTER — Telehealth: Payer: Self-pay | Admitting: Adult Health

## 2016-10-02 ENCOUNTER — Other Ambulatory Visit: Payer: Self-pay | Admitting: Obstetrics and Gynecology

## 2016-10-02 ENCOUNTER — Telehealth: Payer: Self-pay | Admitting: *Deleted

## 2016-10-02 DIAGNOSIS — Z1231 Encounter for screening mammogram for malignant neoplasm of breast: Secondary | ICD-10-CM

## 2016-10-02 NOTE — Telephone Encounter (Signed)
Patient called stating that Victorino DikeJennifer has prescribed her a medication for her hormones that is hard for her to get at the pharmacy and she would like to know if we have any samples of the medication she could have. Please contact pt

## 2016-10-02 NOTE — Telephone Encounter (Signed)
LMOVM that samples of Duavee would be at the front desk for her to pick up.

## 2016-10-06 ENCOUNTER — Ambulatory Visit (HOSPITAL_COMMUNITY)
Admission: RE | Admit: 2016-10-06 | Discharge: 2016-10-06 | Disposition: A | Discharge status: Home / Self Care | Payer: BC Managed Care – PPO | Source: Ambulatory Visit | Attending: Obstetrics and Gynecology | Admitting: Obstetrics and Gynecology

## 2016-10-06 ENCOUNTER — Encounter (HOSPITAL_COMMUNITY): Payer: Self-pay

## 2016-10-06 DIAGNOSIS — Z1231 Encounter for screening mammogram for malignant neoplasm of breast: Secondary | ICD-10-CM | POA: Diagnosis present

## 2016-11-04 ENCOUNTER — Ambulatory Visit (INDEPENDENT_AMBULATORY_CARE_PROVIDER_SITE_OTHER): Payer: BC Managed Care – PPO | Admitting: Adult Health

## 2016-11-04 ENCOUNTER — Encounter: Payer: Self-pay | Admitting: Adult Health

## 2016-11-04 VITALS — BP 112/60 | HR 78 | Ht 65.0 in | Wt 158.0 lb

## 2016-11-04 DIAGNOSIS — Z01419 Encounter for gynecological examination (general) (routine) without abnormal findings: Secondary | ICD-10-CM

## 2016-11-04 DIAGNOSIS — F329 Major depressive disorder, single episode, unspecified: Secondary | ICD-10-CM

## 2016-11-04 DIAGNOSIS — Z01411 Encounter for gynecological examination (general) (routine) with abnormal findings: Secondary | ICD-10-CM

## 2016-11-04 DIAGNOSIS — F419 Anxiety disorder, unspecified: Secondary | ICD-10-CM

## 2016-11-04 DIAGNOSIS — Z79890 Hormone replacement therapy (postmenopausal): Secondary | ICD-10-CM

## 2016-11-04 DIAGNOSIS — Z1212 Encounter for screening for malignant neoplasm of rectum: Secondary | ICD-10-CM

## 2016-11-04 DIAGNOSIS — Z1211 Encounter for screening for malignant neoplasm of colon: Secondary | ICD-10-CM | POA: Diagnosis not present

## 2016-11-04 LAB — HEMOCCULT GUIAC POC 1CARD (OFFICE): FECAL OCCULT BLD: NEGATIVE

## 2016-11-04 MED ORDER — CONJ ESTROGENS-BAZEDOXIFENE 0.45-20 MG PO TABS: ORAL_TABLET | ORAL | 12 refills | Status: DC

## 2016-11-04 NOTE — Progress Notes (Signed)
Patient ID: Deborah Lucero, female   DOB: [birth year], 56 y.o.   MRN: [id number] History of Present Illness: Deborah Lucero is a 56 year old white female, divorced in for well woman gyn exam, she had normal pap with negative HPV 07/18/14.  Still sees Melony Overlyeresa Hurst for for anxiety/depression meds.Has POA for sister who has alzheimer's and has 2 seniors this year. She works for [employer]. She is active, walking about 3-4 miles most days.   Current Medications, Allergies, Past Medical History, Past Surgical History, Family History and Social History were reviewed in [facility] electronic medical record.     Review of Systems: Patient denies any headaches, hearing loss, fatigue, blurred vision, shortness of breath, chest pain, abdominal pain, problems with bowel movements,(constipated at times) urination, or intercourse(not having sex). No joint pain or mood swings. Feels hot at times, esp if been awhile since eaten   Physical Exam:BP 112/60 (BP Location: Right Arm, Patient Position: Sitting, Cuff Size: Normal)   Pulse 78   Ht 5\' 5"  (1.651 m)   Wt 158 lb (71.7 kg)   BMI 26.29 kg/m  General:  Well developed, well nourished, no acute distress Skin:  Warm and dry Neck:  Midline trachea, normal thyroid, good ROM, no lymphadenopathy Lungs; Clear to auscultation bilaterally Breast:  No dominant palpable mass, retraction, or nipple discharge Cardiovascular: Regular rate and rhythm Abdomen:  Soft, non tender, no hepatosplenomegaly Pelvic:  External genitalia is normal in appearance, no lesions.  The vagina is normal in appearance. Urethra has no lesions or masses. The cervix is bulbous.  Uterus is felt to be normal size, shape, and contour.  No adnexal masses or tenderness noted.Bladder is non tender, no masses felt. Rectal: Good sphincter tone, no polyps, + hemorrhoids felt.  Hemoccult negative. Extremities/musculoskeletal:  No swelling or varicosities noted, no clubbing or  cyanosis Psych:  No mood changes, alert and cooperative,seems happy PHQ 2 score 0.  Impression:  1. Encounter for well woman exam with routine gynecological exam   2. Hormone replacement therapy (HRT)   3. Anxiety and depression   4. Screening for colorectal cancer      Plan: Meds ordered this encounter  Medications  . Conj Estrogens-Bazedoxifene (DUAVEE) 0.45-20 MG TABS    Sig: Take 1 daily    Dispense:  28 tablet    Refill:  12    Order Specific Question:   Supervising Provider    Answer:   Duane LopeEURE, LUTHER H [[id number]]  Eat often,more protein   Pap and physical in 1 year Labs at school Mammogram yearly

## 2016-11-04 NOTE — Patient Instructions (Signed)
Pap and physical in 1 year Labs at school Mammogram yearly

## 2017-03-09 ENCOUNTER — Telehealth: Payer: Self-pay | Admitting: Obstetrics & Gynecology

## 2017-03-09 MED ORDER — ESTRADIOL 1 MG PO TABS
1.0000 mg | ORAL_TABLET | Freq: Every day | ORAL | 6 refills | Status: DC
Start: 2017-03-09 — End: 2018-07-07

## 2017-03-09 MED ORDER — PROGESTERONE MICRONIZED 200 MG PO CAPS: ORAL_CAPSULE | ORAL | 6 refills | Status: DC

## 2017-03-09 NOTE — Telephone Encounter (Signed)
Pt off Duavee, ran out and is having hot flashes, will change to estrace 1 mg and prometrium 200 mg at hs

## 2017-03-09 NOTE — Telephone Encounter (Signed)
[facility] and pharmacy has to order every time, unsure how long it will be until they have it. Informed patient we do not have any samples at this time.  She is asking if there is something else comparable that could be prescribed. Please advise.

## 2017-04-24 ENCOUNTER — Ambulatory Visit: Payer: BC Managed Care – PPO | Admitting: Obstetrics and Gynecology

## 2017-04-24 ENCOUNTER — Encounter: Payer: Self-pay | Admitting: Obstetrics and Gynecology

## 2017-04-24 VITALS — BP 152/80 | HR 87 | Temp 98.2°F | Ht 65.0 in | Wt 152.0 lb

## 2017-04-24 DIAGNOSIS — J111 Influenza due to unidentified influenza virus with other respiratory manifestations: Secondary | ICD-10-CM

## 2017-04-24 MED ORDER — ALPRAZOLAM 0.5 MG PO TABS: ORAL_TABLET | ORAL | 2 refills | Status: DC

## 2017-04-24 MED ORDER — VALACYCLOVIR HCL 1 G PO TABS
1000.0000 mg | ORAL_TABLET | Freq: Every day | ORAL | 2 refills | Status: DC
Start: 2017-04-24 — End: 2017-11-25

## 2017-04-24 MED ORDER — HYDROCOD POLST-CPM POLST ER 10-8 MG/5ML PO SUER: 5.0000 mL | Freq: Two times a day (BID) | ORAL | Status: DC

## 2017-04-24 MED ORDER — HYDROCODONE-HOMATROPINE 5-1.5 MG/5ML PO SYRP: 5.0000 mL | ORAL_SOLUTION | Freq: Four times a day (QID) | ORAL | 0 refills | Status: DC | PRN

## 2017-04-24 MED ORDER — OSELTAMIVIR PHOSPHATE 75 MG PO CAPS: 75.0000 mg | ORAL_CAPSULE | Freq: Every day | ORAL | 0 refills | Status: DC

## 2017-04-24 NOTE — Progress Notes (Signed)
[facility] Visit  04/24/2017            Patient name: Deborah Lucero MRN [id number]  Date of birth: Apr 21, 1960  CC & HPI:  Deborah Lucero is a 57 y.o. female presenting today for a cough and congestion for the past 1 weeks. She states she has been having these symptoms for two weeks. She is not producing any phlegm. She was throwing up last night, and has blistered lips as well. She has been unable to taste anything. She has had chills with no fever at home. She is not seeing her counselor anymore, and is requesting a prescription for Alprazolam to help her sleep.   ROS:  ROS (+) cough (+) congestion (+) lip blisters (+) vomiting All systems are negative except as noted in the HPI and PMH.    Pertinent History Reviewed:   Reviewed: Significant for C/S Medical         Past Medical History:  Diagnosis Date  . Abdominal pain 05/05/2013  . Anxiety   . Anxiety and depression 10/04/2015  . Constipation 05/05/2013  . Hiatal hernia   . Mental disorder   . PONV (postoperative nausea and vomiting)   . Watery stools 05/05/2013                              Surgical Hx:    Past Surgical History:  Procedure Laterality Date  . BREAST BIOPSY Right 1986   Benign  . BREAST SURGERY     lumpectomy right breast-benign  . CESAREAN SECTION    . DIAGNOSTIC LAPAROSCOPY    . HYSTEROSCOPY WITH THERMACHOICE  08/14/2011   Procedure: HYSTEROSCOPY WITH THERMACHOICE;  Surgeon: Lazaro ArmsLuther H Eure, MD;  Location: [facility];  Service: Gynecology;;  Thermachoice Endometrial Ablation Total Therapy Time=778min 44sec   Medications: Reviewed & Updated - see associated section                       Current Outpatient Medications:  .  ALPRAZolam (XANAX) 0.5 MG tablet, Take 1 qid (Patient taking differently: Take 1 TID), Disp: 56 tablet, Rfl: 0 .  estradiol (ESTRACE) 1 MG tablet, Take 1 tablet (1 mg total) by mouth daily., Disp: 30 tablet, Rfl: 6 .  pantoprazole (PROTONIX) 40 MG tablet, Take 40 mg by mouth  daily. , Disp: , Rfl:  .  progesterone (PROMETRIUM) 200 MG capsule, Take 1 at HS, Disp: 30 capsule, Rfl: 6 .  sertraline (ZOLOFT) 100 MG tablet, 150 mg daily. , Disp: , Rfl:  .  zolpidem (AMBIEN) 10 MG tablet, Take 1 tablet (10 mg total) by mouth at bedtime as needed for sleep. (Patient not taking: Reported on 12/02/2014), Disp: 30 tablet, Rfl: 3 No taste  Social History: Reviewed -  reports that  has never smoked. she has never used smokeless tobacco.  Objective Findings:  Vitals: Blood pressure (!) 152/80, pulse 87, temperature 98.2 F (36.8 C), height 5\' 5"  (1.651 m), weight 152 lb (68.9 kg).  PHYSICAL EXAMINATION General appearance - alert, well appearing, and in no distress, oriented to person, place, and time and normal appearing weight Mental status - alert, oriented to person, place, and time, normal mood, behavior, speech, dress, motor activity, and thought processes  PELVIC Not indicated Dry nonproductive cough,  Obvious malaise Wearing mask Lower lip ulcer. Atrophic glossitis  Assessment & Plan:   A:  1.   URI/flu   P:  1. Prescribe topical Denovir, Hicodan for cough, Acyclovir, Tamiflu 2. Refill Alprazolam for sleep assistance 3. F/u PRN   By signing my name below, I, Izna Ahmed, attest that this documentation has been prepared under the direction and in the presence of Tilda Burrow, MD. Electronically Signed: Redge Gainer, Medical Scribe. 04/24/17. 1:38 PM.  I personally performed the services described in this documentation, which was SCRIBED in my presence. The recorded information has been reviewed and considered accurate. It has been edited as necessary during review. Tilda Burrow, MD

## 2017-07-10 ENCOUNTER — Other Ambulatory Visit: Payer: Self-pay | Admitting: Obstetrics and Gynecology

## 2017-07-13 NOTE — Telephone Encounter (Signed)
Pt requesting refill on her xanax.  

## 2017-07-13 NOTE — Telephone Encounter (Signed)
Pt reports that she is using 2-3 Xanax 0.5 mg per night for sleep due to 'Night Terrors"  I am going to review her chart for a better option, as this is excessive.

## 2017-07-22 ENCOUNTER — Other Ambulatory Visit: Payer: Self-pay | Admitting: Obstetrics and Gynecology

## 2017-07-22 ENCOUNTER — Telehealth: Payer: Self-pay | Admitting: *Deleted

## 2017-07-22 MED ORDER — ZOLPIDEM TARTRATE 10 MG PO TABS: 5.0000 mg | ORAL_TABLET | Freq: Every day | ORAL | 2 refills | Status: DC

## 2017-07-22 NOTE — Progress Notes (Signed)
Ambien Rx for - 10 mg hs escribed to pharmacy of record.

## 2017-07-22 NOTE — Telephone Encounter (Signed)
[place] [place] of [place] sent to pharmacy

## 2017-07-22 NOTE — Telephone Encounter (Signed)
Patient came into office requesting refill for Ambien.  She states she was told she needed to back off of the Xanax.  She is having difficulty sleeping due to her mind racing.  Please advise.

## 2017-07-23 NOTE — Telephone Encounter (Signed)
LMOVm that Ambien was filled.

## 2017-08-18 ENCOUNTER — Other Ambulatory Visit: Payer: Self-pay | Admitting: Obstetrics and Gynecology

## 2017-10-20 ENCOUNTER — Other Ambulatory Visit: Payer: Self-pay | Admitting: Obstetrics and Gynecology

## 2017-10-20 NOTE — Telephone Encounter (Signed)
refil Zolpidem x 30, refil x 1

## 2017-11-25 ENCOUNTER — Ambulatory Visit: Payer: BC Managed Care – PPO | Admitting: Adult Health

## 2017-11-25 ENCOUNTER — Encounter: Payer: Self-pay | Admitting: Adult Health

## 2017-11-25 ENCOUNTER — Other Ambulatory Visit: Payer: Self-pay

## 2017-11-25 VITALS — BP 130/80 | HR 97 | Ht 65.0 in | Wt 170.0 lb

## 2017-11-25 DIAGNOSIS — F419 Anxiety disorder, unspecified: Secondary | ICD-10-CM

## 2017-11-25 DIAGNOSIS — F329 Major depressive disorder, single episode, unspecified: Secondary | ICD-10-CM

## 2017-11-25 MED ORDER — HYDROXYZINE HCL 10 MG PO TABS: 10.0000 mg | ORAL_TABLET | Freq: Three times a day (TID) | ORAL | 3 refills | Status: DC | PRN

## 2017-11-25 MED ORDER — PAROXETINE HCL 10 MG PO TABS: 10.0000 mg | ORAL_TABLET | Freq: Every day | ORAL | 3 refills | Status: DC

## 2017-11-25 NOTE — Progress Notes (Signed)
  Subjective:     Patient ID: Deborah Lucero, female   DOB: [birth year], 57 y.o.   MRN: [id number]  HPI Deborah Lucero is a 57 year old white female, divorced, worked in for complaints of being depressed since twins went to college.   Review of Systems Depressed and anxious since twins went to college Reviewed past medical,surgical, social and family history. Reviewed medications and allergies.     Objective:   Physical Exam BP 130/80 (BP Location: Left Arm, Cuff Size: Normal)   Pulse 97   Ht 5\' 5"  (1.651 m)   Wt 170 lb (77.1 kg)   BMI 28.29 kg/m  Skin warm and dry.  Lungs: clear to ausculation bilaterally. Cardiovascular: regular rate and rhythm. PHQ 9 score 11, denies being suicidal. Discussed trying Paxil and vistaril and she agrees.Is teary at times.     Assessment:     1. Anxiety and depression       Plan:    Get out at night, with friends some Walk the dog  Meds ordered this encounter  Medications  . PARoxetine (PAXIL) 10 MG tablet    Sig: Take 1 tablet (10 mg total) by mouth daily.    Dispense:  30 tablet    Refill:  3    Order Specific Question:   Supervising Provider    Answer:   Despina HiddenEURE, LUTHER H [[id number]]  . hydrOXYzine (ATARAX/VISTARIL) 10 MG tablet    Sig: Take 1 tablet (10 mg total) by mouth 3 (three) times daily as needed.    Dispense:  30 tablet    Refill:  3    Order Specific Question:   Supervising Provider    Answer:   Despina HiddenEURE, LUTHER H [[id number]]  F/U in 4 weeks

## 2017-11-30 ENCOUNTER — Ambulatory Visit: Payer: BC Managed Care – PPO | Admitting: Adult Health

## 2017-12-18 ENCOUNTER — Other Ambulatory Visit: Payer: Self-pay | Admitting: Obstetrics and Gynecology

## 2017-12-21 NOTE — Telephone Encounter (Signed)
5 -10 mg used HS for insomnia and panic disorder.

## 2017-12-23 ENCOUNTER — Encounter: Payer: Self-pay | Admitting: Adult Health

## 2017-12-23 ENCOUNTER — Ambulatory Visit: Payer: BC Managed Care – PPO | Admitting: Adult Health

## 2017-12-23 VITALS — BP 110/74 | HR 95 | Ht 65.0 in | Wt 172.0 lb

## 2017-12-23 DIAGNOSIS — F329 Major depressive disorder, single episode, unspecified: Secondary | ICD-10-CM

## 2017-12-23 DIAGNOSIS — F419 Anxiety disorder, unspecified: Secondary | ICD-10-CM

## 2017-12-23 DIAGNOSIS — L290 Pruritus ani: Secondary | ICD-10-CM

## 2017-12-23 DIAGNOSIS — F32A Depression, unspecified: Secondary | ICD-10-CM

## 2017-12-23 MED ORDER — PAROXETINE HCL 20 MG PO TABS: 20.0000 mg | ORAL_TABLET | Freq: Every day | ORAL | 2 refills | Status: DC

## 2017-12-23 MED ORDER — LIDOCAINE (ANORECTAL) 5 % EX CREA: 1.0000 "application " | TOPICAL_CREAM | CUTANEOUS | 0 refills | Status: DC | PRN

## 2017-12-23 NOTE — Progress Notes (Signed)
  Subjective:     Patient ID: Deborah Lucero, female   DOB: May 10, 1960, 57 y.o.   MRN: [id number]  HPI Emalie is a 57 year old white female, divorced, PM back in follow up on starting paxil and still depressed.Her sister has dementia and is worse, has had seizures and is being placed at [facility], after being in hospital at [facility] and at [facility].   Review of Systems Still depressed Left elbow skin hurts +rectal itching from hemorrhoid Reviewed past medical,surgical, social and family history. Reviewed medications and allergies.     Objective:   Physical Exam BP 110/74 (BP Location: Left Arm, Patient Position: Sitting, Cuff Size: Normal)   Pulse 95   Ht 5\' 5"  (1.651 m)   Wt 172 lb (78 kg)   BMI 28.62 kg/m   PHQ 9 score 15, denies being suicidal, is on 10 mg paxil, will increase to 20 mg. Has dry skin left elbow, no deformity or swelling or redness but skin feels tender to her, when touched.rubbed 5% lidocaine on it and she felt better.  Take time for self. Continue to use lotion on elbow and preparation H on hemorrhoid and add recticare prn.     Assessment:     1. Anxiety and depression   2. Rectal itching       Plan:  :    Meds ordered this encounter  Medications  . PARoxetine (PAXIL) 20 MG tablet    Sig: Take 1 tablet (20 mg total) by mouth daily.    Dispense:  30 tablet    Refill:  2    Order Specific Question:   Supervising Provider    Answer:   Despina Hidden, LUTHER H [[id number]]  . Lidocaine, Anorectal, (RECTICARE) 5 % CREA    Sig: Apply 1 application topically every 4 (four) hours as needed.    Dispense:  10 g    Refill:  0    Order Specific Question:   Supervising Provider    Answer:   Despina Hidden, LUTHER H [[id number]]  F/U in 4 weeks

## 2018-01-14 ENCOUNTER — Telehealth: Payer: Self-pay | Admitting: Women's Health

## 2018-01-14 NOTE — Telephone Encounter (Signed)
Spoke with pt. Pt has itchy rash under both breasts. Has had this before. Cortisone and Benadryl not helping. Please advise. Thanks!! JSY

## 2018-01-14 NOTE — Telephone Encounter (Signed)
Patient called, stated she has a rash under her breast, itchy and hot x one week.  Cortizone is not helping.   [facility]  [phone]

## 2018-01-15 ENCOUNTER — Telehealth: Payer: Self-pay | Admitting: Obstetrics & Gynecology

## 2018-01-15 MED ORDER — NYSTATIN-TRIAMCINOLONE 100000-0.1 UNIT/GM-% EX OINT: 1.0000 "application " | TOPICAL_OINTMENT | Freq: Two times a day (BID) | CUTANEOUS | 11 refills | Status: DC

## 2018-01-15 NOTE — Telephone Encounter (Signed)
Pt aware med was sent to pharmacy. JSY °

## 2018-01-15 NOTE — Telephone Encounter (Signed)
mytrex was e prescribed for patient

## 2018-01-19 ENCOUNTER — Ambulatory Visit: Payer: BC Managed Care – PPO | Admitting: Adult Health

## 2018-01-19 ENCOUNTER — Encounter: Payer: Self-pay | Admitting: Adult Health

## 2018-01-19 VITALS — BP 138/84 | HR 98 | Ht 65.0 in | Wt 172.0 lb

## 2018-01-19 DIAGNOSIS — F419 Anxiety disorder, unspecified: Secondary | ICD-10-CM

## 2018-01-19 DIAGNOSIS — F329 Major depressive disorder, single episode, unspecified: Secondary | ICD-10-CM | POA: Diagnosis not present

## 2018-01-19 DIAGNOSIS — B369 Superficial mycosis, unspecified: Secondary | ICD-10-CM

## 2018-01-19 MED ORDER — PAROXETINE HCL 20 MG PO TABS: 20.0000 mg | ORAL_TABLET | Freq: Every day | ORAL | 2 refills | Status: DC

## 2018-01-19 MED ORDER — BUSPIRONE HCL 7.5 MG PO TABS: 7.5000 mg | ORAL_TABLET | Freq: Three times a day (TID) | ORAL | 2 refills | Status: DC

## 2018-01-19 NOTE — Progress Notes (Signed)
  Subjective:     Patient ID: Deborah Lucero, female   DOB: 01-06-1961, 57 y.o.   MRN: [id number]  HPI [name] is a 57 year old white female, back in follow up on increasing Paxil to 20 mg  And is better but still anxious. Has had rash and itching of breasts better since starting mytrex.  PCP is Dr Ouida SillsFagan.    Review of Systems +anxious Dreads Mondays and having to go to work, is thinking of retiring first of the year  Holds guilt in her over sister, too.  Breasts itch, and feel hot  Reviewed past medical,surgical, social and family history. Reviewed medications and allergies.     Objective:   Physical Exam BP 138/84 (BP Location: Left Arm, Patient Position: Sitting, Cuff Size: Normal)   Pulse 98   Ht 5\' 5"  (1.651 m)   Wt 172 lb (78 kg)   BMI 28.62 kg/m     Skin warm and dry,  Breasts:no dominate palpable mass, retraction or nipple discharge, has resolving skin fungus.But has areas of petechiae  where scratched.  PHQ 9 score 13, which is down form 15. Will continue Paxil 20 mg and add buspar to see if helps.  Assessment:     1. Anxiety and depression   2. Superficial fungus infection of skin       Plan:     Continue mytrex on breast Meds ordered this encounter  Medications  . PARoxetine (PAXIL) 20 MG tablet    Sig: Take 1 tablet (20 mg total) by mouth daily.    Dispense:  30 tablet    Refill:  2    Order Specific Question:   Supervising Provider    Answer:   Despina HiddenEURE, LUTHER H [[id number]]  . busPIRone (BUSPAR) 7.5 MG tablet    Sig: Take 1 tablet (7.5 mg total) by mouth 3 (three) times daily.    Dispense:  90 tablet    Refill:  2    Order Specific Question:   Supervising Provider    Answer:   Despina HiddenEURE, LUTHER H [[id number]]  F/U in 6 weeks

## 2018-01-25 ENCOUNTER — Telehealth: Payer: Self-pay | Admitting: Adult Health

## 2018-01-25 MED ORDER — PREDNISONE 10 MG (21) PO TBPK: ORAL_TABLET | ORAL | 0 refills | Status: DC

## 2018-01-25 NOTE — Telephone Encounter (Signed)
Still having itching, has tried xyzal, will rx prednisone dose pak

## 2018-02-15 ENCOUNTER — Other Ambulatory Visit: Payer: Self-pay | Admitting: Obstetrics and Gynecology

## 2018-02-16 NOTE — Telephone Encounter (Signed)
refil ambien x 30 tabs with 2 refil.

## 2018-03-04 ENCOUNTER — Ambulatory Visit: Payer: BC Managed Care – PPO | Admitting: Adult Health

## 2018-04-15 ENCOUNTER — Encounter: Payer: Self-pay | Admitting: Adult Health

## 2018-04-15 ENCOUNTER — Ambulatory Visit: Payer: BC Managed Care – PPO | Admitting: Adult Health

## 2018-04-15 ENCOUNTER — Encounter: Payer: Self-pay | Admitting: *Deleted

## 2018-04-15 VITALS — BP 151/98 | HR 92 | Ht 65.0 in | Wt 173.0 lb

## 2018-04-15 DIAGNOSIS — F419 Anxiety disorder, unspecified: Secondary | ICD-10-CM | POA: Diagnosis not present

## 2018-04-15 DIAGNOSIS — L299 Pruritus, unspecified: Secondary | ICD-10-CM

## 2018-04-15 DIAGNOSIS — F329 Major depressive disorder, single episode, unspecified: Secondary | ICD-10-CM

## 2018-04-15 DIAGNOSIS — F410 Panic disorder [episodic paroxysmal anxiety] without agoraphobia: Secondary | ICD-10-CM | POA: Diagnosis not present

## 2018-04-15 MED ORDER — BUSPIRONE HCL 10 MG PO TABS: 10.0000 mg | ORAL_TABLET | Freq: Three times a day (TID) | ORAL | 3 refills | Status: DC

## 2018-04-15 MED ORDER — LEVOCETIRIZINE DIHYDROCHLORIDE 5 MG PO TABS: 5.0000 mg | ORAL_TABLET | Freq: Every evening | ORAL | 1 refills | Status: DC

## 2018-04-15 MED ORDER — TRAZODONE HCL 50 MG PO TABS: 50.0000 mg | ORAL_TABLET | Freq: Every day | ORAL | 1 refills | Status: DC

## 2018-04-15 MED ORDER — PAROXETINE HCL 30 MG PO TABS: 30.0000 mg | ORAL_TABLET | Freq: Every day | ORAL | 3 refills | Status: DC

## 2018-04-15 NOTE — Progress Notes (Signed)
Patient ID: Deborah Lucero, female   DOB: 1960/07/19, 58 y.o.   MRN: [id number] History of Present Illness: Deborah Lucero is a 58 year old white female, divorced in complaining of increased anxiety and panic attacks and itching esp under breast.She was treated for scabies in [place]. Has stress at work, and sister is declining( she has dementia). PCP is Dr Deborah Lucero.   Current Medications, Allergies, Past Medical History, Past Surgical History, Family History and Social History were reviewed in [name].     Review of Systems: + increased anxiety Can't sleep all night, take Ambien and goes to sleep but wakes up during the night +itching + panic attacks "Stomach in knots, with increased BMs, will even throw up at times",     Physical Exam:BP (!) 151/98 (BP Location: Left Arm, Patient Position: Sitting, Cuff Size: Normal)   Pulse 92   Ht 5\' 5"  (1.651 m)   Wt 173 lb (78.5 kg)   BMI 28.79 kg/m  General:  Well developed, well nourished, no acute distress Skin:  Warm and dry, no visible rash under breasts or on trunk, but she says it itches  Psych:  No mood changes, alert and cooperative,seems happy Fall risk is moderate PHQ 9 score 9, is on meds and denies being suicidal.  Will increase Paxil and Buspar, and stop vistaril she does not how she feels when takes it,and will add trazodone at bedtime and do not take Ambien with the trazodone.Will try xyzal for itching.   Impression: 1. Anxiety and depression   2. Panic attacks   3. Itching       Plan: Meds ordered this encounter  Medications  . busPIRone (BUSPAR) 10 MG tablet    Sig: Take 1 tablet (10 mg total) by mouth 3 (three) times daily.    Dispense:  90 tablet    Refill:  3    Order Specific Question:   Supervising Provider    Answer:   Deborah Lucero, Deborah Lucero [[id number]]  . PARoxetine (PAXIL) 30 MG tablet    Sig: Take 1 tablet (30 mg total) by mouth daily.    Dispense:  30 tablet    Refill:  3    Order  Specific Question:   Supervising Provider    Answer:   Deborah Lucero, Deborah Lucero [[id number]]  . traZODone (DESYREL) 50 MG tablet    Sig: Take 1 tablet (50 mg total) by mouth at bedtime.    Dispense:  30 tablet    Refill:  1    Order Specific Question:   Supervising Provider    Answer:   Deborah Lucero, Deborah Lucero [[id number]]  . levocetirizine (XYZAL) 5 MG tablet    Sig: Take 1 tablet (5 mg total) by mouth every evening.    Dispense:  30 tablet    Refill:  1    Order Specific Question:   Supervising Provider    Answer:   Deborah Lucero, Deborah Lucero [[id number]]  F/U in 4 weeks

## 2018-05-11 ENCOUNTER — Ambulatory Visit: Payer: BC Managed Care – PPO | Admitting: Adult Health

## 2018-06-02 ENCOUNTER — Other Ambulatory Visit: Payer: Self-pay | Admitting: Obstetrics and Gynecology

## 2018-06-03 NOTE — Telephone Encounter (Signed)
Patient called, stated a refill request was sent yesterday and she hasn't heard anything.  [facility]  [phone]

## 2018-06-03 NOTE — Telephone Encounter (Signed)
30 zolpidem 10 mg and 2 refils escribed

## 2018-06-03 NOTE — Telephone Encounter (Signed)
Pt calling to status on her medication being refilled.

## 2018-07-07 ENCOUNTER — Other Ambulatory Visit: Payer: Self-pay | Admitting: Adult Health

## 2018-07-13 ENCOUNTER — Telehealth: Payer: Self-pay | Admitting: *Deleted

## 2018-07-13 NOTE — Telephone Encounter (Signed)
Called patient and left message informing her that we are not allowing any visitors or children to come to visit with her at this time. Also requested that if she has had any exposure to anyone suspected or confirmed of having COVID-19 to call to reschedule appointment. Also requested that if she is experiencing any of the following to call and reschedule : fever, cough, sob, muscle pain, diarrhea, rash, vomiting, abdominal pain, red eye, weakness, bruising or bleeding, joint pain, or a severe headache. Also advised that if she has a mask to wear it to her appointment.  

## 2018-07-14 ENCOUNTER — Other Ambulatory Visit: Payer: Self-pay

## 2018-07-14 ENCOUNTER — Ambulatory Visit (INDEPENDENT_AMBULATORY_CARE_PROVIDER_SITE_OTHER): Payer: BC Managed Care – PPO | Admitting: Adult Health

## 2018-07-14 ENCOUNTER — Encounter: Payer: Self-pay | Admitting: Adult Health

## 2018-07-14 DIAGNOSIS — L299 Pruritus, unspecified: Secondary | ICD-10-CM

## 2018-07-14 DIAGNOSIS — F329 Major depressive disorder, single episode, unspecified: Secondary | ICD-10-CM | POA: Diagnosis not present

## 2018-07-14 DIAGNOSIS — F419 Anxiety disorder, unspecified: Secondary | ICD-10-CM

## 2018-07-14 MED ORDER — TRAZODONE HCL 50 MG PO TABS: 50.0000 mg | ORAL_TABLET | Freq: Every day | ORAL | 1 refills | Status: DC

## 2018-07-14 MED ORDER — MOMETASONE FUROATE 0.1 % EX OINT: TOPICAL_OINTMENT | Freq: Every day | CUTANEOUS | 0 refills | Status: DC

## 2018-07-14 MED ORDER — PAROXETINE HCL 30 MG PO TABS: 30.0000 mg | ORAL_TABLET | Freq: Every day | ORAL | 3 refills | Status: DC

## 2018-07-14 MED ORDER — BUSPIRONE HCL 10 MG PO TABS: 10.0000 mg | ORAL_TABLET | Freq: Three times a day (TID) | ORAL | 3 refills | Status: DC

## 2018-07-14 NOTE — Progress Notes (Signed)
Patient ID: Deborah AverLisa Lucero, female   DOB: 04-May-1960, 58 y.o.   MRN: [id number]   TELEHEALTH VIRTUAL GYNECOLOGY VISIT ENCOUNTER NOTE  I connected with Deborah AverLisa Lucero on 07/14/18 at 11:30 AM EDT by telephone at home and verified that I am speaking with the correct person using two identifiers.   I discussed the limitations, risks, security and privacy concerns of performing an evaluation and management service by telephone and the availability of in person appointments. I also discussed with the patient that there may be a patient responsible charge related to this service. The patient expressed understanding and agreed to proceed.   History:  Deborah AverLisa Lucero is a 58 y.o. G1P2, white  Female,divorced, being evaluated today for anxiety and depression and is sleeping better, but has days, that is not better she wonders if she may be bipolar.  She has itching in bra line to tops of thighs and saw dermatologist last week was prescribed ketoconazole and vistaril but it is not helping. She denies any  other concerns.  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 And she has 2 college age kids home.   PCP is Dr Ouida SillsFagan.    Past Medical History:  Diagnosis Date  . Abdominal pain 05/05/2013  . Anxiety   . Anxiety and depression 10/04/2015  . Constipation 05/05/2013  . Hiatal hernia   . Mental disorder   . PONV (postoperative nausea and vomiting)   . Scoliosis   . Watery stools 05/05/2013   Past Surgical History:  Procedure Laterality Date  . BREAST BIOPSY Right 1986   Benign  . BREAST SURGERY     lumpectomy right breast-benign  . CESAREAN SECTION    . DIAGNOSTIC LAPAROSCOPY    . HYSTEROSCOPY WITH THERMACHOICE  08/14/2011   Procedure: HYSTEROSCOPY WITH THERMACHOICE;  Surgeon: Lazaro ArmsLuther H Eure, MD;  Location: [facility];  Service: Gynecology;;  Thermachoice Endometrial Ablation Total Therapy Time=698min 44sec   The following portions of the  patient's history were reviewed and updated as appropriate: allergies, current medications, past family history, past medical history, past social history, past surgical history and problem list.   Health Maintenance:  Normal pap and negative HRHPV on 07/18/14, needs pap.  Normal mammogram on 10/06/16.  Review of Systems:  Pertinent items noted in HPI and remainder of comprehensive ROS otherwise negative.  Physical Exam:   General:  Alert, oriented and cooperative.   Mental Status: Normal mood and affect perceived. Normal judgment and thought content.  Physical exam deferred due to nature of the encounter PHQ 9 score is 9, is on meds, denies being suicidal.  Discussed with her that she needs to see psychiatrist and I gave her number to [facility] [place], she can self refer.   Labs and Imaging No results found for this or any previous visit (from the past 336 hour(s)). No results found.    Assessment and Plan:     1. Anxiety and depression Continue meds, will refill for now, but she is aware that these may be changed when she sees psychiatrist  Meds ordered this encounter  Medications  . busPIRone (BUSPAR) 10 MG tablet    Sig: Take 1 tablet (10 mg total) by mouth 3 (three) times daily.    Dispense:  90 tablet    Refill:  3    Order Specific Question:   Supervising Provider    Answer:   Despina HiddenEURE, LUTHER H [[id number]]  .  PARoxetine (PAXIL) 30 MG tablet    Sig: Take 1 tablet (30 mg total) by mouth daily.    Dispense:  30 tablet    Refill:  3    Order Specific Question:   Supervising Provider    Answer:   Despina Hidden, LUTHER H [[id number]]  . traZODone (DESYREL) 50 MG tablet    Sig: Take 1 tablet (50 mg total) by mouth at bedtime.    Dispense:  30 tablet    Refill:  1    Order Specific Question:   Supervising Provider    Answer:   Despina Hidden, LUTHER H [[id number]]  . mometasone (ELOCON) 0.1 % ointment    Sig: Apply topically daily.    Dispense:  45 g    Refill:  0    Order Specific  Question:   Supervising Provider    Answer:   Lazaro Arms [[id number]]  Number given for [facility] counseling center [phone] Return in 2 months for pap and physical and ROS.   2. Itching Will try elocon ointment         I discussed the assessment and treatment plan with the patient. The patient was provided an opportunity to ask questions and all were answered. The patient agreed with the plan and demonstrated an understanding of the instructions.   The patient was advised to call back or seek an in-person evaluation/go to the ED if the symptoms worsen or if the condition fails to improve as anticipated.  I provided 11 minutes of non-face-to-face time during this encounter.   Cyril Mourning, NP [facility] [employer], [facility]

## 2018-08-16 ENCOUNTER — Telehealth: Payer: Self-pay | Admitting: Obstetrics and Gynecology

## 2018-08-16 NOTE — Telephone Encounter (Signed)
Left message to call me, can't take Ambien if taking trazodone at hs, so call me back

## 2018-08-16 NOTE — Telephone Encounter (Signed)
Pt requesting a refill of ambien sent to [facility] on [address].

## 2018-08-19 ENCOUNTER — Telehealth: Payer: Self-pay | Admitting: Obstetrics and Gynecology

## 2018-08-19 NOTE — Telephone Encounter (Signed)
Called patient no answer.

## 2018-08-19 NOTE — Telephone Encounter (Signed)
Pt requesting a refill of Ambien sent to [facility] on [address].

## 2018-08-20 ENCOUNTER — Telehealth: Payer: Self-pay | Admitting: *Deleted

## 2018-08-20 MED ORDER — ZOLPIDEM TARTRATE 10 MG PO TABS: ORAL_TABLET | ORAL | 2 refills | Status: DC

## 2018-08-20 NOTE — Telephone Encounter (Signed)
Refilled Ambien 

## 2018-08-20 NOTE — Addendum Note (Signed)
Addended by: Derrek Monaco A on: 08/20/2018 01:43 PM   Modules accepted: Orders

## 2018-08-20 NOTE — Telephone Encounter (Signed)
Patient states she is no longer taking Trazadone.  Refill can be sent to [facility] on [address].  She does not need a return call.

## 2018-09-08 ENCOUNTER — Telehealth: Payer: Self-pay | Admitting: Adult Health

## 2018-09-08 MED ORDER — NYSTATIN 100000 UNIT/GM EX POWD: Freq: Three times a day (TID) | CUTANEOUS | 1 refills | Status: DC

## 2018-09-08 NOTE — Telephone Encounter (Signed)
Pt requesting a oral medication for a fungal rash she gets under her breast every summer. States that the ointment is not helping. Pt uses [facility] on [address].

## 2018-09-08 NOTE — Telephone Encounter (Signed)
Will Rx nystatin powder

## 2018-09-29 ENCOUNTER — Other Ambulatory Visit: Payer: Self-pay

## 2018-09-29 ENCOUNTER — Other Ambulatory Visit (HOSPITAL_COMMUNITY)
Admission: RE | Admit: 2018-09-29 | Discharge: 2018-09-29 | Disposition: A | Discharge status: Home / Self Care | Payer: BC Managed Care – PPO | Source: Ambulatory Visit | Attending: Adult Health | Admitting: Adult Health

## 2018-09-29 ENCOUNTER — Ambulatory Visit (INDEPENDENT_AMBULATORY_CARE_PROVIDER_SITE_OTHER): Payer: BC Managed Care – PPO | Admitting: Adult Health

## 2018-09-29 ENCOUNTER — Encounter: Payer: Self-pay | Admitting: Adult Health

## 2018-09-29 VITALS — BP 135/91 | HR 91 | Ht 65.5 in | Wt 188.5 lb

## 2018-09-29 DIAGNOSIS — R232 Flushing: Secondary | ICD-10-CM

## 2018-09-29 DIAGNOSIS — Z01419 Encounter for gynecological examination (general) (routine) without abnormal findings: Secondary | ICD-10-CM | POA: Insufficient documentation

## 2018-09-29 DIAGNOSIS — Z1211 Encounter for screening for malignant neoplasm of colon: Secondary | ICD-10-CM | POA: Insufficient documentation

## 2018-09-29 DIAGNOSIS — F32A Depression, unspecified: Secondary | ICD-10-CM

## 2018-09-29 DIAGNOSIS — R030 Elevated blood-pressure reading, without diagnosis of hypertension: Secondary | ICD-10-CM | POA: Insufficient documentation

## 2018-09-29 DIAGNOSIS — Z1212 Encounter for screening for malignant neoplasm of rectum: Secondary | ICD-10-CM

## 2018-09-29 DIAGNOSIS — Z79890 Hormone replacement therapy (postmenopausal): Secondary | ICD-10-CM

## 2018-09-29 DIAGNOSIS — E7800 Pure hypercholesterolemia, unspecified: Secondary | ICD-10-CM

## 2018-09-29 DIAGNOSIS — S29011A Strain of muscle and tendon of front wall of thorax, initial encounter: Secondary | ICD-10-CM | POA: Insufficient documentation

## 2018-09-29 DIAGNOSIS — F329 Major depressive disorder, single episode, unspecified: Secondary | ICD-10-CM

## 2018-09-29 DIAGNOSIS — K219 Gastro-esophageal reflux disease without esophagitis: Secondary | ICD-10-CM

## 2018-09-29 DIAGNOSIS — B369 Superficial mycosis, unspecified: Secondary | ICD-10-CM | POA: Insufficient documentation

## 2018-09-29 LAB — HEMOCCULT GUIAC POC 1CARD (OFFICE): Fecal Occult Blood, POC: NEGATIVE

## 2018-09-29 MED ORDER — CYCLOBENZAPRINE HCL 5 MG PO TABS: 5.0000 mg | ORAL_TABLET | Freq: Three times a day (TID) | ORAL | 0 refills | Status: DC | PRN

## 2018-09-29 MED ORDER — BUSPIRONE HCL 10 MG PO TABS: 10.0000 mg | ORAL_TABLET | Freq: Three times a day (TID) | ORAL | 3 refills | Status: DC

## 2018-09-29 MED ORDER — ZOLPIDEM TARTRATE 10 MG PO TABS: ORAL_TABLET | ORAL | 2 refills | Status: DC

## 2018-09-29 MED ORDER — DEXLANSOPRAZOLE 60 MG PO CPDR: 60.0000 mg | DELAYED_RELEASE_CAPSULE | Freq: Every day | ORAL | 6 refills | Status: DC

## 2018-09-29 MED ORDER — NYSTATIN 100000 UNIT/GM EX POWD: Freq: Three times a day (TID) | CUTANEOUS | 1 refills | Status: DC

## 2018-09-29 MED ORDER — ESTRADIOL-LEVONORGESTREL 0.045-0.015 MG/DAY TD PTWK: 1.0000 | MEDICATED_PATCH | TRANSDERMAL | 12 refills | Status: DC

## 2018-09-29 MED ORDER — PAROXETINE HCL 30 MG PO TABS: 30.0000 mg | ORAL_TABLET | Freq: Every day | ORAL | 3 refills | Status: DC

## 2018-09-29 NOTE — Patient Instructions (Signed)

## 2018-09-29 NOTE — Progress Notes (Addendum)
Patient ID: Deborah AverLisa Lucero, female   DOB: April 30, 1960, 58 y.o.   MRN: [id number] History of Present Illness: Deborah Lucero is a 58 year old white female, PM, divorced, in for a well woman gyn exam and pap. She is having hot flashes and has gained weight and has GERD and now pain over left rib under breast. PCP is Dr Ouida SillsFagan.    Current Medications, Allergies, Past Medical History, Past Surgical History, Family History and Social History were reviewed in [facility] electronic medical record.     Review of Systems: Patient denies any headaches, hearing loss, fatigue, blurred vision, shortness of breath, chest pain, abdominal pain, problems with bowel movements, urination, or intercourse(not active). No joint pain or mood swings. +weight gain +hot flashes +GERD  Pain left rib area   Physical Exam:BP (!) 135/91 (BP Location: Right Arm, Patient Position: Sitting, Cuff Size: Normal)   Pulse 91   Ht 5' 5.5" (1.664 m)   Wt 188 lb 8 oz (85.5 kg)   BMI 30.89 kg/m  General:  Well developed, well nourished, no acute distress Skin:  Warm and dry,tan Neck:  Midline trachea, normal thyroid, good ROM, no lymphadenopathy Lungs; Clear to auscultation bilaterally Breast:  No dominant palpable mass, retraction, or nipple discharge, +tenderness over left rib area under breast Cardiovascular: Regular rate and rhythm Abdomen:  Soft, non tender, no hepatosplenomegaly Pelvic:  External genitalia is normal in appearance, no lesions.  The vagina is normal in appearance for age with pale pink with loss of moisture and rugae.Marland Kitchen. Urethra has no lesions or masses. The cervix is smooth, pap with HPV reflex 16/18 typing performed.  Uterus is felt to be normal size, shape, and contour.  No adnexal masses or tenderness noted.Bladder is non tender, no masses felt. Rectal: Good sphincter tone, no polyps, + hemorrhoids felt.  Hemoccult negative. Extremities/musculoskeletal:  No swelling or varicosities noted, no clubbing or  cyanosis Psych:  No mood changes, alert and cooperative,seems sad today, but an smile easily PHQ 9 score 14, is on meds and denies being suicidal.Kids going back to college, and sister in [facility] now.  Fall risk is low Examination chaperoned by Malachy MoodJanet Young LPN She requests to try the HRT patch again.   Impression: 1. Encounter for gynecological examination with Papanicolaou smear of cervix   2. Screening for colorectal cancer   3. Anxiety and depression   4. Superficial fungus infection of skin   5. Gastroesophageal reflux disease, esophagitis presence not specified   6. Muscle strain of chest wall, initial encounter   7. Hormone replacement therapy (HRT)   8. Hot flashes   9. Elevated cholesterol   10. Elevated BP without diagnosis of hypertension       Plan: Pap with HPV 16/18 genotyping sent Check CBC,CMP,TSH and lipids, orders given to be done fasting Will refill paxil,buspar,ambein and nystatin Stop Protonix and try dexilant Try flexeril and ice over left rib area Meds ordered this encounter  Medications  . dexlansoprazole (DEXILANT) 60 MG capsule    Sig: Take 1 capsule (60 mg total) by mouth daily.    Dispense:  30 capsule    Refill:  6    Order Specific Question:   Supervising Provider    Answer:   Despina HiddenEURE, LUTHER H [[id number]]  . busPIRone (BUSPAR) 10 MG tablet    Sig: Take 1 tablet (10 mg total) by mouth 3 (three) times daily.    Dispense:  90 tablet    Refill:  3  Order Specific Question:   Supervising Provider    Answer:   Tania Ade H [[id number]]  . nystatin (MYCOSTATIN/NYSTOP) powder    Sig: Apply topically 3 (three) times daily.    Dispense:  45 g    Refill:  1    Order Specific Question:   Supervising Provider    Answer:   EURE, LUTHER H [[id number]]  . PARoxetine (PAXIL) 30 MG tablet    Sig: Take 1 tablet (30 mg total) by mouth daily.    Dispense:  30 tablet    Refill:  3    Order Specific Question:   Supervising Provider    Answer:   Elonda Husky, LUTHER H [[id number]]   . zolpidem (AMBIEN) 10 MG tablet    Sig: TAKE ONE-HALF TO ONE TABLET BY MOUTH AT BEDTIME    Dispense:  30 tablet    Refill:  2    Order Specific Question:   Supervising Provider    Answer:   Elonda Husky, LUTHER H [[id number]]  . estradiol-levonorgestrel (CLIMARAPRO) 0.045-0.015 MG/DAY    Sig: Place 1 patch onto the skin once a week.    Dispense:  4 patch    Refill:  12    Order Specific Question:   Supervising Provider    Answer:   Elonda Husky, LUTHER H [[id number]]  . cyclobenzaprine (FLEXERIL) 5 MG tablet    Sig: Take 1 tablet (5 mg total) by mouth 3 (three) times daily as needed for muscle spasms.    Dispense:  30 tablet    Refill:  0    Order Specific Question:   Supervising Provider    Answer:   Florian Buff [[id number]]  Will try HRT patch again Try DASH diet, and try to lose some weight for BP and return in 3 months to check BP and if still elevated, may add BP meds Referred to Dr Laural Golden for colonoscopy Get mammogram  Physical in 1 year Pap in 3 if normal

## 2018-10-03 LAB — CYTOLOGY - PAP
Adequacy: ABSENT
Diagnosis: NEGATIVE
HPV: NOT DETECTED

## 2018-10-07 ENCOUNTER — Ambulatory Visit (INDEPENDENT_AMBULATORY_CARE_PROVIDER_SITE_OTHER): Payer: BC Managed Care – PPO | Admitting: Nurse Practitioner

## 2018-11-12 ENCOUNTER — Telehealth: Payer: Self-pay | Admitting: Obstetrics & Gynecology

## 2018-11-12 ENCOUNTER — Telehealth: Payer: Self-pay | Admitting: *Deleted

## 2018-11-12 MED ORDER — ALPRAZOLAM 0.5 MG PO TABS: 0.5000 mg | ORAL_TABLET | Freq: Two times a day (BID) | ORAL | 1 refills | Status: DC | PRN

## 2018-11-12 MED ORDER — PREDNISONE 10 MG PO TABS: ORAL_TABLET | ORAL | 0 refills | Status: DC

## 2018-11-12 NOTE — Telephone Encounter (Signed)
Patient states she has a reoccurring rash under both breasts that [name] has treated in the past.  She is using a steroid cream and powder that was prescribed but states it is not working and usually gets worse with stress.  She has recently lost her sister and has been under a lot of stress and is experiencing anxiety attacks.  States she has spoken with [name] personally and was advised to send message to [name].  She is requesting an oral steroid and a small dosage of alprazolam. Please advise.

## 2018-11-12 NOTE — Telephone Encounter (Signed)
Rx: xanax and prednisone

## 2018-11-12 NOTE — Telephone Encounter (Signed)
Pharmacy called stating they accidentally deleted the xanax order if you would please resend. Thanks.

## 2018-11-12 NOTE — Telephone Encounter (Signed)
Patient informed alprazolam and steroid sent to pharmacy.

## 2018-11-12 NOTE — Telephone Encounter (Signed)
Resent as requested.  

## 2018-11-30 ENCOUNTER — Other Ambulatory Visit: Payer: Self-pay

## 2018-11-30 DIAGNOSIS — Z20822 Contact with and (suspected) exposure to covid-19: Secondary | ICD-10-CM

## 2018-12-01 LAB — NOVEL CORONAVIRUS, NAA: SARS-CoV-2, NAA: NOT DETECTED

## 2018-12-30 ENCOUNTER — Ambulatory Visit: Payer: BC Managed Care – PPO | Admitting: Adult Health

## 2018-12-30 ENCOUNTER — Encounter: Payer: Self-pay | Admitting: Adult Health

## 2018-12-30 ENCOUNTER — Other Ambulatory Visit: Payer: Self-pay

## 2018-12-30 VITALS — BP 117/79 | HR 100 | Ht 65.0 in | Wt 194.0 lb

## 2018-12-30 DIAGNOSIS — R635 Abnormal weight gain: Secondary | ICD-10-CM

## 2018-12-30 NOTE — Progress Notes (Signed)
  Subjective:     Patient ID: Deborah Lucero, female   DOB: 12-03-1960, 58 y.o.   MRN: [id number]  HPI Deborah Lucero is a 58 year old white female, divorced, in for a BP check, BP was 56/83 at student health. PCP is Dr Willey Blade.   Review of Systems +weight gain Moods good with paxil, stopped buspar Stopped HRT patch  Reviewed past medical,surgical, social and family history. Reviewed medications and allergies.     Objective:   Physical Exam BP 117/79 (BP Location: Left Arm, Patient Position: Sitting, Cuff Size: Normal)   Pulse 100   Ht 5\' 5"  (1.651 m)   Wt 194 lb (88 kg)   BMI 32.28 kg/m   Skin warm and dry. Lungs: clear to ausculation bilaterally. Cardiovascular: regular rate and rhythm.   +weight gain of about 20 lbs since February.  Assessment:     Weight gain    Plan:     Get fasting labs and will talk about adipex then Follow up in 3 months

## 2018-12-31 ENCOUNTER — Other Ambulatory Visit: Payer: Self-pay

## 2018-12-31 DIAGNOSIS — Z20822 Contact with and (suspected) exposure to covid-19: Secondary | ICD-10-CM

## 2019-01-01 LAB — CBC
Hematocrit: 39.9 % (ref 34.0–46.6)
Hemoglobin: 13.8 g/dL (ref 11.1–15.9)
MCH: 31 pg (ref 26.6–33.0)
MCHC: 34.6 g/dL (ref 31.5–35.7)
MCV: 90 fL (ref 79–97)
Platelets: 382 10*3/uL (ref 150–450)
RBC: 4.45 x10E6/uL (ref 3.77–5.28)
RDW: 13.6 % (ref 11.7–15.4)
WBC: 7.5 10*3/uL (ref 3.4–10.8)

## 2019-01-01 LAB — COMPREHENSIVE METABOLIC PANEL
ALT: 56 IU/L — ABNORMAL HIGH (ref 0–32)
AST: 38 IU/L (ref 0–40)
Albumin/Globulin Ratio: 1.8 (ref 1.2–2.2)
Albumin: 4.5 g/dL (ref 3.8–4.9)
Alkaline Phosphatase: 73 IU/L (ref 39–117)
BUN/Creatinine Ratio: 12 (ref 9–23)
BUN: 13 mg/dL (ref 6–24)
Bilirubin Total: 0.2 mg/dL (ref 0.0–1.2)
CO2: 22 mmol/L (ref 20–29)
Calcium: 11.2 mg/dL — ABNORMAL HIGH (ref 8.7–10.2)
Chloride: 100 mmol/L (ref 96–106)
Creatinine, Ser: 1.09 mg/dL — ABNORMAL HIGH (ref 0.57–1.00)
GFR calc Af Amer: 66 mL/min/{1.73_m2} (ref >59)
GFR calc non Af Amer: 57 mL/min/{1.73_m2} — ABNORMAL LOW (ref >59)
Globulin, Total: 2.5 g/dL (ref 1.5–4.5)
Glucose: 103 mg/dL — ABNORMAL HIGH (ref 65–99)
Potassium: 4.7 mmol/L (ref 3.5–5.2)
Sodium: 139 mmol/L (ref 134–144)
Total Protein: 7 g/dL (ref 6.0–8.5)

## 2019-01-01 LAB — LIPID PANEL
Chol/HDL Ratio: 5.2 ratio — ABNORMAL HIGH (ref 0.0–4.4)
Cholesterol, Total: 230 mg/dL — ABNORMAL HIGH (ref 100–199)
HDL: 44 mg/dL (ref >39)
LDL Chol Calc (NIH): 147 mg/dL — ABNORMAL HIGH (ref 0–99)
Triglycerides: 214 mg/dL — ABNORMAL HIGH (ref 0–149)
VLDL Cholesterol Cal: 39 mg/dL (ref 5–40)

## 2019-01-01 LAB — TSH: TSH: 3.48 u[IU]/mL (ref 0.450–4.500)

## 2019-01-01 LAB — NOVEL CORONAVIRUS, NAA: SARS-CoV-2, NAA: NOT DETECTED

## 2019-01-03 ENCOUNTER — Other Ambulatory Visit: Payer: Self-pay | Admitting: Adult Health

## 2019-01-04 ENCOUNTER — Other Ambulatory Visit: Payer: Self-pay | Admitting: Adult Health

## 2019-01-04 DIAGNOSIS — R748 Abnormal levels of other serum enzymes: Secondary | ICD-10-CM

## 2019-01-04 NOTE — Progress Notes (Signed)
Recheck CMP in 2 weeks

## 2019-01-18 ENCOUNTER — Encounter: Payer: Self-pay | Admitting: *Deleted

## 2019-01-21 ENCOUNTER — Other Ambulatory Visit: Payer: Self-pay

## 2019-01-21 DIAGNOSIS — Z20822 Contact with and (suspected) exposure to covid-19: Secondary | ICD-10-CM

## 2019-01-24 LAB — NOVEL CORONAVIRUS, NAA: SARS-CoV-2, NAA: NOT DETECTED

## 2019-01-25 ENCOUNTER — Other Ambulatory Visit: Payer: Self-pay | Admitting: Obstetrics & Gynecology

## 2019-01-26 ENCOUNTER — Telehealth: Payer: Self-pay | Admitting: *Deleted

## 2019-01-26 ENCOUNTER — Other Ambulatory Visit: Payer: Self-pay | Admitting: Obstetrics & Gynecology

## 2019-01-26 NOTE — Telephone Encounter (Signed)
Patient called wanted refill on Xanax.

## 2019-01-27 ENCOUNTER — Other Ambulatory Visit: Payer: Self-pay | Admitting: Obstetrics & Gynecology

## 2019-01-27 MED ORDER — ALPRAZOLAM 0.5 MG PO TABS: 0.5000 mg | ORAL_TABLET | Freq: Two times a day (BID) | ORAL | 1 refills | Status: DC | PRN

## 2019-01-31 ENCOUNTER — Other Ambulatory Visit: Payer: Self-pay | Admitting: Adult Health

## 2019-01-31 MED ORDER — PREDNISONE 10 MG PO TABS: ORAL_TABLET | ORAL | 0 refills | Status: DC

## 2019-01-31 NOTE — Progress Notes (Signed)
Will refill prednisone

## 2019-02-22 ENCOUNTER — Other Ambulatory Visit: Payer: BC Managed Care – PPO

## 2019-02-28 ENCOUNTER — Ambulatory Visit: Payer: BC Managed Care – PPO | Attending: Internal Medicine

## 2019-02-28 ENCOUNTER — Other Ambulatory Visit: Payer: Self-pay

## 2019-02-28 DIAGNOSIS — Z20822 Contact with and (suspected) exposure to covid-19: Secondary | ICD-10-CM

## 2019-03-01 LAB — NOVEL CORONAVIRUS, NAA: SARS-CoV-2, NAA: NOT DETECTED

## 2019-03-31 ENCOUNTER — Ambulatory Visit: Payer: BC Managed Care – PPO | Admitting: Adult Health

## 2019-04-06 ENCOUNTER — Ambulatory Visit: Payer: BC Managed Care – PPO | Admitting: Adult Health

## 2019-04-18 ENCOUNTER — Telehealth: Payer: Self-pay | Admitting: *Deleted

## 2019-04-18 NOTE — Telephone Encounter (Signed)
Patient left message wanting refill on Xanax sent to [facility] on [address]

## 2019-04-19 ENCOUNTER — Telehealth: Payer: Self-pay | Admitting: *Deleted

## 2019-04-19 MED ORDER — ALPRAZOLAM 0.5 MG PO TABS: 0.5000 mg | ORAL_TABLET | Freq: Two times a day (BID) | ORAL | 0 refills | Status: DC | PRN

## 2019-04-19 NOTE — Telephone Encounter (Signed)
Refilled xanax

## 2019-04-19 NOTE — Addendum Note (Signed)
Addended by: Cyril Mourning A on: 04/19/2019 01:30 PM   Modules accepted: Orders

## 2019-04-19 NOTE — Telephone Encounter (Signed)
Pt requesting refill on her xanax.

## 2019-05-03 ENCOUNTER — Ambulatory Visit: Payer: BC Managed Care – PPO | Attending: Internal Medicine

## 2019-05-03 DIAGNOSIS — Z23 Encounter for immunization: Secondary | ICD-10-CM

## 2019-05-03 NOTE — Progress Notes (Signed)
Patient received Moderna COVID Vaccine  Lot number [id number].  Pfizer was documented in error.  

## 2019-05-31 ENCOUNTER — Ambulatory Visit: Payer: BC Managed Care – PPO | Attending: Internal Medicine

## 2019-05-31 DIAGNOSIS — Z23 Encounter for immunization: Secondary | ICD-10-CM

## 2019-05-31 NOTE — Progress Notes (Signed)
   Covid-19 Vaccination [facility]  Name:  Deborah Lucero    MRN: [id number] DOB: November 06, 1960  05/31/2019  Deborah Lucero was observed post Covid-19 immunization for 15 minutes without incident. She was provided with Vaccine Information Sheet and instruction to access the V-Safe system.   Deborah Lucero was instructed to call 911 with any severe reactions post vaccine: Marland Kitchen Difficulty breathing  . Swelling of face and throat  . A fast heartbeat  . A bad rash all over body  . Dizziness and weakness   Immunizations Administered    Name Date Dose VIS Date Route   Moderna COVID-19 Vaccine 05/31/2019  9:23 AM 0.5 mL 02/01/2019 Intramuscular   Manufacturer: Moderna   Lot: [id number]   NDC: 50277-412-87

## 2019-06-01 ENCOUNTER — Other Ambulatory Visit: Payer: Self-pay | Admitting: Adult Health

## 2019-06-14 ENCOUNTER — Telehealth: Payer: Self-pay | Admitting: Orthopaedic Surgery

## 2019-06-14 ENCOUNTER — Ambulatory Visit: Payer: BC Managed Care – PPO | Admitting: Orthopaedic Surgery

## 2019-06-14 ENCOUNTER — Other Ambulatory Visit: Payer: Self-pay

## 2019-06-14 ENCOUNTER — Encounter: Payer: Self-pay | Admitting: Orthopaedic Surgery

## 2019-06-14 ENCOUNTER — Ambulatory Visit: Payer: BC Managed Care – PPO

## 2019-06-14 VITALS — BP 109/75 | HR 90 | Ht 65.0 in | Wt 206.0 lb

## 2019-06-14 DIAGNOSIS — M5442 Lumbago with sciatica, left side: Secondary | ICD-10-CM

## 2019-06-14 DIAGNOSIS — G8929 Other chronic pain: Secondary | ICD-10-CM

## 2019-06-14 MED ORDER — TIZANIDINE HCL 4 MG PO TABS: ORAL_TABLET | ORAL | 3 refills | Status: DC

## 2019-06-14 MED ORDER — NAPROXEN 500 MG PO TABS: 500.0000 mg | ORAL_TABLET | Freq: Two times a day (BID) | ORAL | 5 refills | Status: DC

## 2019-06-14 MED ORDER — HYDROCODONE-ACETAMINOPHEN 5-325 MG PO TABS: 1.0000 | ORAL_TABLET | ORAL | 0 refills | Status: AC | PRN

## 2019-06-14 NOTE — Telephone Encounter (Signed)
Routing to nurse to advise patient.

## 2019-06-14 NOTE — Telephone Encounter (Signed)
Do not fill.  There is no narcotic I can give her.  Tell pharmacy.  Thanks

## 2019-06-14 NOTE — Patient Instructions (Signed)
Note for out of work for 1 week. 

## 2019-06-14 NOTE — Progress Notes (Signed)
Subjective:    Patient ID: Deborah Lucero, female    DOB: 1960-06-29, 59 y.o.   MRN: [id number]  HPI She has lower back pain. She says it began really bad yesterday.  She has midline back pain that goes to the left buttock area but not to the thigh area.  She has no weakness or bowel or bladder problem.  She has had back pain in the past but not recently. She has used heat/ice and Advil.  She has no other areas of pain.   Review of Systems  Constitutional: Positive for activity change.  Musculoskeletal: Positive for arthralgias and back pain.  Psychiatric/Behavioral: The patient is nervous/anxious.   All other systems reviewed and are negative.  , For Review of Systems, all other systems reviewed and are negative.  The following is a summary of the past history medically, past history surgically, known current medicines, social history and family history.  This information is gathered electronically by the computer from prior information and documentation.  I review this each visit and have found including this information at this point in the chart is beneficial and informative.   Past Medical History:  Diagnosis Date  . Abdominal pain 05/05/2013  . Anxiety   . Anxiety and depression 10/04/2015  . Constipation 05/05/2013  . Hiatal hernia   . Mental disorder   . PONV (postoperative nausea and vomiting)   . Scoliosis   . Watery stools 05/05/2013    Past Surgical History:  Procedure Laterality Date  . BREAST BIOPSY Right 1986   Benign  . BREAST SURGERY     lumpectomy right breast-benign  . CESAREAN SECTION  06/24/1999  . DIAGNOSTIC LAPAROSCOPY    . HYSTEROSCOPY WITH THERMACHOICE  08/14/2011   Procedure: HYSTEROSCOPY WITH THERMACHOICE;  Surgeon: Florian Buff, MD;  Location: [facility];  Service: Gynecology;;  Thermachoice Endometrial Ablation Total Therapy Time=49min 44sec    Current Outpatient Medications on File Prior to Visit  Medication Sig Dispense Refill  . ALPRAZolam  (XANAX) 0.5 MG tablet TAKE 1 TABLET(0.5 MG) BY MOUTH TWICE DAILY AS NEEDED FOR ANXIETY 60 tablet 0  . Ibuprofen (ADVIL PO) Take by mouth as needed.     . pantoprazole (PROTONIX) 40 MG tablet Take 40 mg by mouth daily.    Marland Kitchen zolpidem (AMBIEN) 10 MG tablet TAKE ONE-HALF TO ONE TABLET BY MOUTH AT BEDTIME 30 tablet 2   No current facility-administered medications on file prior to visit.    Social History   Socioeconomic History  . Marital status: Divorced    Spouse name: Not on file  . Number of children: Not on file  . Years of education: Not on file  . Highest education level: Not on file  Occupational History  . Not on file  Tobacco Use  . Smoking status: Never Smoker  . Smokeless tobacco: Never Used  Substance and Sexual Activity  . Alcohol use: Yes    Alcohol/week: 2.0 standard drinks    Types: 2 Glasses of wine per week    Comment: twice weekly  . Drug use: No  . Sexual activity: Not Currently    Birth control/protection: Surgical, Post-menopausal    Comment: ablation  Other Topics Concern  . Not on file  Social History Narrative  . Not on file   Social Determinants of Health   Financial Resource Strain:   . Difficulty of Paying Living Expenses:   Food Insecurity:   . Worried About Charity fundraiser in the Last  Year:   . Ran Out of Food in the Last Year:   Transportation Needs:   . Freight forwarder (Medical):   Marland Kitchen Lack of Transportation (Non-Medical):   Physical Activity:   . Days of Exercise per Week:   . Minutes of Exercise per Session:   Stress:   . Feeling of Stress :   Social Connections:   . Frequency of Communication with Friends and Family:   . Frequency of Social Gatherings with Friends and Family:   . Attends Religious Services:   . Active Member of Clubs or Organizations:   . Attends Banker Meetings:   Marland Kitchen Marital Status:   Intimate Partner Violence:   . Fear of Current or Ex-Partner:   . Emotionally Abused:   Marland Kitchen Physically  Abused:   . Sexually Abused:     Family History  Problem Relation Age of Onset  . Alzheimer's disease Father   . Alzheimer's disease Sister     BP 109/75   Pulse 90   Ht 5\' 5"  (1.651 m)   Wt 206 lb (93.4 kg)   BMI 34.28 kg/m   Body mass index is 34.28 kg/m.     Objective:   Physical Exam Vitals and nursing note reviewed.  Constitutional:      Appearance: She is well-developed.  HENT:     Head: Normocephalic and atraumatic.  Eyes:     Conjunctiva/sclera: Conjunctivae normal.     Pupils: Pupils are equal, round, and reactive to light.  Cardiovascular:     Rate and Rhythm: Normal rate and regular rhythm.  Pulmonary:     Effort: Pulmonary effort is normal.  Abdominal:     Palpations: Abdomen is soft.  Musculoskeletal:     Cervical back: Normal range of motion and neck supple.       Back:  Skin:    General: Skin is warm and dry.  Neurological:     Mental Status: She is alert and oriented to person, place, and time.     Cranial Nerves: No cranial nerve deficit.     Motor: No abnormal muscle tone.     Coordination: Coordination normal.     Deep Tendon Reflexes: Reflexes are normal and symmetric. Reflexes normal.  Psychiatric:        Behavior: Behavior normal.        Thought Content: Thought content normal.        Judgment: Judgment normal.    X-rays were done of the lumbar spine, reported separately.       Assessment & Plan:   Encounter Diagnosis  Name Primary?  . Chronic left-sided low back pain with left-sided sciatica Yes   Begin Norco, Naprosyn and Zanaflex.  Use ice.  Rest.  Off work .  Return in one week.  Call if any problem.  Precautions discussed.   Electronically Signed Quarry manager, MD 4/13/20212:36 PM

## 2019-06-14 NOTE — Telephone Encounter (Signed)
Pharmacy and patient notified. Recommended the patient try Aleve and to call back if she doesn't feel like that helps.

## 2019-06-14 NOTE — Telephone Encounter (Signed)
Patient called back following visit today, 06/14/19 regarding medication prescribed; states has had reaction previously with Codiene; therefore, cannot take it - has not yet picked up although it has been filled by [facility], [address]. Please advise.

## 2019-06-16 ENCOUNTER — Telehealth: Payer: Self-pay | Admitting: Orthopaedic Surgery

## 2019-06-16 MED ORDER — PREDNISONE 5 MG (21) PO TBPK: ORAL_TABLET | ORAL | 0 refills | Status: DC

## 2019-06-16 NOTE — Telephone Encounter (Signed)
Patient called following office visit 06/14/19 to ask if Dr Hilda Lias would order her a steroid for her back to possibly "takd down some the inflammation"?  States she also saw chiropractor Dr Ladona Ridgel yesterday. If so, pharmacy is [address], [address], [place]

## 2019-06-16 NOTE — Telephone Encounter (Signed)
Notified patient. Thanked Dr Hilda Lias.

## 2019-06-21 ENCOUNTER — Telehealth: Payer: Self-pay | Admitting: Adult Health

## 2019-06-21 ENCOUNTER — Other Ambulatory Visit: Payer: Self-pay | Admitting: Adult Health

## 2019-06-21 ENCOUNTER — Ambulatory Visit: Payer: BC Managed Care – PPO | Admitting: Adult Health

## 2019-06-21 NOTE — Progress Notes (Signed)
Ck CMP

## 2019-06-21 NOTE — Telephone Encounter (Signed)
The patient was 25 mins late for her appointment.  She rescheduled for 06/29/19.  She stated Victorino Dike wanted her to repeat lab and would like an order sent in.  [phone]

## 2019-06-21 NOTE — Telephone Encounter (Signed)
Pt states she went to the lab this am and was told the orders were not in there. JAG put orders in again. JSY

## 2019-06-23 ENCOUNTER — Ambulatory Visit: Payer: BC Managed Care – PPO | Admitting: Orthopaedic Surgery

## 2019-06-28 LAB — COMPREHENSIVE METABOLIC PANEL
ALT: 26 IU/L (ref 0–32)
AST: 21 IU/L (ref 0–40)
Albumin/Globulin Ratio: 1.6 (ref 1.2–2.2)
Albumin: 4.1 g/dL (ref 3.8–4.9)
Alkaline Phosphatase: 89 IU/L (ref 39–117)
BUN/Creatinine Ratio: 12 (ref 9–23)
BUN: 12 mg/dL (ref 6–24)
Bilirubin Total: <0.2 mg/dL (ref 0.0–1.2)
CO2: 19 mmol/L — ABNORMAL LOW (ref 20–29)
Calcium: 10.6 mg/dL — ABNORMAL HIGH (ref 8.7–10.2)
Chloride: 103 mmol/L (ref 96–106)
Creatinine, Ser: 1.01 mg/dL — ABNORMAL HIGH (ref 0.57–1.00)
GFR calc Af Amer: 72 mL/min/{1.73_m2} (ref >59)
GFR calc non Af Amer: 62 mL/min/{1.73_m2} (ref >59)
Globulin, Total: 2.5 g/dL (ref 1.5–4.5)
Glucose: 87 mg/dL (ref 65–99)
Potassium: 4.5 mmol/L (ref 3.5–5.2)
Sodium: 139 mmol/L (ref 134–144)
Total Protein: 6.6 g/dL (ref 6.0–8.5)

## 2019-06-29 ENCOUNTER — Encounter: Payer: Self-pay | Admitting: Adult Health

## 2019-06-29 ENCOUNTER — Ambulatory Visit: Payer: BC Managed Care – PPO | Admitting: Adult Health

## 2019-06-29 ENCOUNTER — Other Ambulatory Visit: Payer: Self-pay

## 2019-06-29 DIAGNOSIS — R635 Abnormal weight gain: Secondary | ICD-10-CM | POA: Diagnosis not present

## 2019-06-29 NOTE — Progress Notes (Signed)
  Subjective:     Patient ID: Pamala Hayman, female   DOB: 02/24/1961, 59 y.o.   MRN: [id number]  HPI Juanisha is a 59 year old white female,divorced,G1P2. in to go over CMP, ALT,creatinine and calcium was elevated 6 months ago and she just did labs this week in follow up.She is complaining of weight gain PCP is Dr Ouida Sills.   Review of Systems +weight gain Reviewed past medical,surgical, social and family history. Reviewed medications and allergies.     Objective:   Physical Exam BP 133/79 (BP Location: Right Arm, Patient Position: Sitting, Cuff Size: Normal)   Pulse 83   Ht 5\' 5"  (1.651 m)   Wt 208 lb (94.3 kg)   BMI 34.61 kg/m  Skin warm and dry. Neck: mid line trachea, normal thyroid, good ROM, no lymphadenopathy noted. Lungs: clear to ausculation bilaterally. Cardiovascular: regular rate and rhythm. Calcium is 10.6, down form 11.2 6 months ago, creatinine 1.01, was 1.09 and ALT 26 down from 56    Assessment:     1. Serum calcium elevated Refer to Dr   2. Weight gain Had normal TSH in October, see Dr November first before considering weight loss meds    Plan:     Return in August for physical

## 2019-07-03 ENCOUNTER — Other Ambulatory Visit: Payer: Self-pay | Admitting: Adult Health

## 2019-07-05 ENCOUNTER — Other Ambulatory Visit: Payer: Self-pay

## 2019-07-05 ENCOUNTER — Ambulatory Visit
Admission: EM | Admit: 2019-07-05 | Discharge: 2019-07-05 | Disposition: A | Discharge status: Home / Self Care | Payer: BC Managed Care – PPO | Attending: Family Medicine | Admitting: Family Medicine

## 2019-07-05 DIAGNOSIS — L578 Other skin changes due to chronic exposure to nonionizing radiation: Secondary | ICD-10-CM | POA: Diagnosis not present

## 2019-07-05 DIAGNOSIS — R21 Rash and other nonspecific skin eruption: Secondary | ICD-10-CM | POA: Diagnosis not present

## 2019-07-05 DIAGNOSIS — L299 Pruritus, unspecified: Secondary | ICD-10-CM

## 2019-07-05 MED ORDER — DEXAMETHASONE SODIUM PHOSPHATE 10 MG/ML IJ SOLN
10.0000 mg | Freq: Once | INTRAMUSCULAR | Status: AC
  Administered 2019-07-05: 10 mg via INTRAMUSCULAR

## 2019-07-05 MED ORDER — PREDNISONE 10 MG (21) PO TBPK
ORAL_TABLET | Freq: Every day | ORAL | 0 refills | Status: AC
Start: 2019-07-05 — End: 2019-07-11

## 2019-07-05 NOTE — Discharge Instructions (Signed)
You have received a steroid shot in the office to help with the itching.  I have sent in a prednisone taper for you to start tomorrow if you are still experiencing extreme itching.  Follow up with this office or with primary care if symptoms persist or are not improving.

## 2019-07-05 NOTE — ED Provider Notes (Signed)
[facility]   [id number] 07/05/19 Arrival Time: 1628  CC: RASH  SUBJECTIVE:  Deborah Lucero is a 59 y.o. female who presents with a skin complaint that began 3 days ago.  Denies precipitating event or trauma.  Denies changes in soaps, detergents, close contacts with similar rash, known trigger or environmental trigger, allergy. Denies medications change or starting a new medication recently.  Localizes the rash to sun exposed ares of arms, legs, and chest.  Describes it as itchy.  Has tried cortisone cream without relief.  Symptoms are made worse with sun exposure.  Reports similar symptoms in the past that improved with steroids.   Denies fever, chills, nausea, vomiting, erythema, swelling, discharge, oral lesions, SOB, chest pain, abdominal pain, changes in bowel or bladder function.    ROS: As per HPI.  All other pertinent ROS negative.     Past Medical History:  Diagnosis Date  . Abdominal pain 05/05/2013  . Anxiety   . Anxiety and depression 10/04/2015  . Constipation 05/05/2013  . Hiatal hernia   . Mental disorder   . PONV (postoperative nausea and vomiting)   . Scoliosis   . Watery stools 05/05/2013   Past Surgical History:  Procedure Laterality Date  . BREAST BIOPSY Right 1986   Benign  . BREAST SURGERY     lumpectomy right breast-benign  . CESAREAN SECTION  06/24/1999  . DIAGNOSTIC LAPAROSCOPY    . HYSTEROSCOPY WITH THERMACHOICE  08/14/2011   Procedure: HYSTEROSCOPY WITH THERMACHOICE;  Surgeon: Lazaro Arms, MD;  Location: [facility];  Service: Gynecology;;  Thermachoice Endometrial Ablation Total Therapy Time=47min 44sec   Allergies  Allergen Reactions  . Codeine Other (See Comments)    Hallucinations   No current facility-administered medications on file prior to encounter.   Current Outpatient Medications on File Prior to Encounter  Medication Sig Dispense Refill  . ALPRAZolam (XANAX) 0.5 MG tablet TAKE 1 TABLET(0.5 MG) BY MOUTH TWICE DAILY AS NEEDED FOR  ANXIETY 60 tablet 0  . Ibuprofen (ADVIL PO) Take by mouth as needed.     . naproxen (NAPROSYN) 500 MG tablet Take 1 tablet (500 mg total) by mouth 2 (two) times daily with a meal. (Patient not taking: Reported on 06/29/2019) 60 tablet 5  . pantoprazole (PROTONIX) 40 MG tablet Take 40 mg by mouth daily.    Marland Kitchen zolpidem (AMBIEN) 10 MG tablet TAKE 1/2 TO 1 TABLET BY MOUTH EVERY NIGHT AT BEDTIME 30 tablet 0   Social History   Socioeconomic History  . Marital status: Divorced    Spouse name: Not on file  . Number of children: Not on file  . Years of education: Not on file  . Highest education level: Not on file  Occupational History  . Not on file  Tobacco Use  . Smoking status: Never Smoker  . Smokeless tobacco: Never Used  Substance and Sexual Activity  . Alcohol use: Yes    Alcohol/week: 2.0 standard drinks    Types: 2 Glasses of wine per week    Comment: twice weekly  . Drug use: No  . Sexual activity: Not Currently    Birth control/protection: Surgical, Post-menopausal    Comment: ablation  Other Topics Concern  . Not on file  Social History Narrative  . Not on file   Social Determinants of Health   Financial Resource Strain:   . Difficulty of Paying Living Expenses:   Food Insecurity:   . Worried About Programme researcher, broadcasting/film/video in the Last Year:   .  Ran Out of Food in the Last Year:   Transportation Needs:   . Film/video editor (Medical):   Marland Kitchen Lack of Transportation (Non-Medical):   Physical Activity:   . Days of Exercise per Week:   . Minutes of Exercise per Session:   Stress:   . Feeling of Stress :   Social Connections:   . Frequency of Communication with Friends and Family:   . Frequency of Social Gatherings with Friends and Family:   . Attends Religious Services:   . Active Member of Clubs or Organizations:   . Attends Archivist Meetings:   Marland Kitchen Marital Status:   Intimate Partner Violence:   . Fear of Current or Ex-Partner:   . Emotionally Abused:     Marland Kitchen Physically Abused:   . Sexually Abused:    Family History  Problem Relation Age of Onset  . Alzheimer's disease Father   . Alzheimer's disease Sister     OBJECTIVE: Vitals:   07/05/19 1640  BP: 138/88  Pulse: 98  Resp: 18  Temp: 98.3 F (36.8 C)  SpO2: 96%    General appearance: alert; no distress Head: NCAT Lungs: clear to auscultation bilaterally Heart: regular rate and rhythm.  Radial pulse 2+ bilaterally Extremities: no edema Skin: warm and dry; papular pruritic rash to arms, legs and exposed area of chest. Psychological: alert and cooperative; normal mood and affect  ASSESSMENT & PLAN:  1. Rash and nonspecific skin eruption   2. Pruritus   3. Dermatitis due to sun     Meds ordered this encounter  Medications  . dexamethasone (DECADRON) injection 10 mg  . predniSONE (STERAPRED UNI-PAK 21 TAB) 10 MG (21) TBPK tablet    Sig: Take by mouth daily for 6 days. Take 6 tablets on day 1, 5 tablets on day 2, 4 tablets on day 3, 3 tablets on day 4, 2 tablets on day 5, 1 tablet on day 6    Dispense:  21 tablet    Refill:  0    Order Specific Question:   Supervising Provider    Answer:   Chase Picket [[id number]]      Prescribed prednisone taper, instructed patient on how to  Hydroxyzine or benadryl at night for nighttime relief   Take as prescribed and to completion Avoid hot showers/ baths Moisturize skin daily  Follow up with PCP if symptoms persists Return or go to the ER if you have any new or worsening symptoms such as fever, chills, nausea, vomiting, redness, swelling, discharge, if symptoms do not improve with medications, etc...  Reviewed expectations re: course of current medical issues. Questions answered. Outlined signs and symptoms indicating need for more acute intervention. Patient verbalized understanding. After Visit Summary given.    Faustino Congress, NP 07/05/19 [phone]

## 2019-07-05 NOTE — ED Triage Notes (Signed)
Pt presents with rash on chest and legs after sun exposure on Sunday

## 2019-07-14 ENCOUNTER — Other Ambulatory Visit: Payer: Self-pay | Admitting: Adult Health

## 2019-07-26 ENCOUNTER — Other Ambulatory Visit: Payer: Self-pay

## 2019-07-26 ENCOUNTER — Ambulatory Visit: Payer: BC Managed Care – PPO | Admitting: "Endocrinology

## 2019-07-26 ENCOUNTER — Encounter: Payer: Self-pay | Admitting: "Endocrinology

## 2019-07-26 DIAGNOSIS — E6609 Other obesity due to excess calories: Secondary | ICD-10-CM | POA: Diagnosis not present

## 2019-07-26 DIAGNOSIS — Z6834 Body mass index (BMI) 34.0-34.9, adult: Secondary | ICD-10-CM | POA: Diagnosis not present

## 2019-07-26 DIAGNOSIS — E66811 Obesity, class 1: Secondary | ICD-10-CM

## 2019-07-26 NOTE — Patient Instructions (Signed)

## 2019-07-26 NOTE — Progress Notes (Signed)
Endocrinology Consult Note       07/26/2019, 6:19 PM  Deborah Lucero is a 59 y.o.-year-old female, referred by her  Asencion Noble, MD  , for evaluation for hypercalcemia/hyperparathyroidism.   Past Medical History:  Diagnosis Date  . Abdominal pain 05/05/2013  . Anxiety   . Anxiety and depression 10/04/2015  . Constipation 05/05/2013  . Hiatal hernia   . Mental disorder   . PONV (postoperative nausea and vomiting)   . Scoliosis   . Watery stools 05/05/2013    Past Surgical History:  Procedure Laterality Date  . BREAST BIOPSY Right 1986   Benign  . BREAST SURGERY     lumpectomy right breast-benign  . CESAREAN SECTION  06/24/1999  . DIAGNOSTIC LAPAROSCOPY    . HYSTEROSCOPY WITH THERMACHOICE  08/14/2011   Procedure: HYSTEROSCOPY WITH THERMACHOICE;  Surgeon: Florian Buff, MD;  Location: [facility];  Service: Gynecology;;  Thermachoice Endometrial Ablation Total Therapy Time=98min 44sec    Social History   Tobacco Use  . Smoking status: Never Smoker  . Smokeless tobacco: Never Used  Substance Use Topics  . Alcohol use: Yes    Alcohol/week: 2.0 standard drinks    Types: 2 Glasses of wine per week    Comment: twice weekly  . Drug use: No    Family History  Problem Relation Age of Onset  . Alzheimer's disease Father   . Alzheimer's disease Sister     Outpatient Encounter Medications as of 07/26/2019  Medication Sig  . ALPRAZolam (XANAX) 0.5 MG tablet TAKE 1 TABLET(0.5 MG) BY MOUTH TWICE DAILY AS NEEDED FOR ANXIETY  . Ibuprofen (ADVIL PO) Take by mouth as needed.   . pantoprazole (PROTONIX) 40 MG tablet Take 40 mg by mouth daily.  Marland Kitchen zolpidem (AMBIEN) 10 MG tablet TAKE 1/2 TO 1 TABLET BY MOUTH EVERY NIGHT AT BEDTIME  . [DISCONTINUED] naproxen (NAPROSYN) 500 MG tablet Take 1 tablet (500 mg total) by mouth 2 (two) times daily with a meal. (Patient not taking: Reported on 06/29/2019)   No facility-administered encounter  medications on file as of 07/26/2019.    Allergies  Allergen Reactions  . Codeine Other (See Comments)    Hallucinations     HPI  Deborah Lucero was diagnosed with hypercalcemia on routine blood work on December 04, 2018 when it was 11.2.    Patient has no previously known history of parathyroid, pituitary, adrenal dysfunctions; no family history of such dysfunctions. -Review of herreferral package of most recent labs reveals calcium of 10.6 , no correspondening measurement of PTH.   -She never had bone density scan done. No prior history of fragility fractures or falls. No history of  kidney stones.  No history of CKD.  She did have normal TSH in October 2020.  She does not have history of malignancy.  she is not on HCTZ or other thiazide therapy.  No history of  vitamin D deficiency.  she is not on calcium supplements,  she eats dairy and green, leafy, vegetables on average amounts.  she does not have a family history of hypercalcemia, pituitary tumors, thyroid cancer, or osteoporosis.   Her other concern is progressive weight  gain.  Over the last 5-6 years, she has gained 40 pounds progressively, more rapidly in the last year.  She was exposed to steroids at least on one occasion.  She denies any history of diabetes/prediabetes.  She admits to some dietary indiscretions. -She has not tried any commission weight loss programs.   ROS:  Constitutional: + weight gain, no fatigue, no subjective hyperthermia, no subjective hypothermia Eyes: no blurry vision, no xerophthalmia ENT: no sore throat, no nodules palpated in throat, no dysphagia/odynophagia, no hoarseness Cardiovascular: no Chest Pain, no Shortness of Breath, no palpitations, no leg swelling Respiratory: no cough, no shortness of breath  Gastrointestinal: no Nausea/Vomiting/Diarhhea Musculoskeletal: no muscle/joint aches Skin: no rashes Neurological: no tremors, no numbness, no tingling, no dizziness Psychiatric: no  depression, no anxiety  PE: BP 108/73   Pulse 87   Ht 5\' 5"  (1.651 m)   Wt 206 lb 9.6 oz (93.7 kg)   BMI 34.38 kg/m , Body mass index is 34.38 kg/m. Wt Readings from Last 3 Encounters:  07/26/19 206 lb 9.6 oz (93.7 kg)  06/29/19 208 lb (94.3 kg)  06/14/19 206 lb (93.4 kg)    Constitutional:  + BMI 34.3, not in acute distress, normal state of mind Eyes: PERRLA, EOMI, no exophthalmos ENT: moist mucous membranes, no gross thyromegaly, no gross cervical lymphadenopathy Cardiovascular: normal precordial activity, Regular Rate and Rhythm, no Murmur/Rubs/Gallops Respiratory:  adequate breathing efforts, no gross chest deformity, Clear to auscultation bilaterally Gastrointestinal: abdomen soft, Non -tender, No distension, Bowel Sounds present Musculoskeletal: no gross deformities, strength intact in all four extremities Skin: moist, warm, no rashes Neurological: no tremor with outstretched hands, Deep tendon reflexes normal in bilateral lower extremities.     CMP ( most recent) CMP     Component Value Date/Time   NA 139 06/27/2019 0819   K 4.5 06/27/2019 0819   CL 103 06/27/2019 0819   CO2 19 (L) 06/27/2019 0819   GLUCOSE 87 06/27/2019 0819   GLUCOSE 76 12/02/2014 1412   BUN 12 06/27/2019 0819   CREATININE 1.01 (H) 06/27/2019 0819   CREATININE 0.85 05/05/2013 1710   CALCIUM 10.6 (H) 06/27/2019 0819   PROT 6.6 06/27/2019 0819   ALBUMIN 4.1 06/27/2019 0819   AST 21 06/27/2019 0819   ALT 26 06/27/2019 0819   ALKPHOS 89 06/27/2019 0819   BILITOT <0.2 06/27/2019 0819   GFRNONAA 62 06/27/2019 0819   GFRAA 72 06/27/2019 0819    Diabetic Labs (most recent): Lab Results  Component Value Date   HGBA1C 5.0 10/04/2015     Lipid Panel ( most recent) Lipid Panel     Component Value Date/Time   CHOL 230 (H) 12/31/2018 0855   TRIG 214 (H) 12/31/2018 0855   HDL 44 12/31/2018 0855   CHOLHDL 5.2 (H) 12/31/2018 0855   LDLCALC 147 (H) 12/31/2018 0855   LABVLDL 39 12/31/2018 0855       Lab Results  Component Value Date   TSH 3.480 12/31/2018   TSH 3.210 10/04/2015      Assessment: 1. Hypercalcemia  2.  Class I obesity  Plan: Patient has had at least 2 instances of elevated calcium, with the highest being 11.2 mg/dl.  - No apparent complications from hypercalcemia/hyperparathyroidism: no history of  nephrolithiasis,  osteoporosis,fragility fractures. No abdominal pain, no major mood disorders, no bone pain.  - I discussed with the patient about the physiology of calcium and parathyroid hormone, and possible  effects of  increased PTH/ Calcium , osteoporosis, abdominal pain,  etc.   - The work up so far is not sufficient to reach a conclusion for definitive therapy.  she  needs more studies to confirm and classify the parathyroid dysfunction she may have. I will proceed to obtain  repeat intact PTH/calcium, PTH RP, serum magnesium, serum phosphorus.  It is also essential to obtain 24-hour urine calcium/creatinine to rule out the rare but important cause of mild elevation in calcium and PTH- FHH ( Familial Hypocalciuric Hypercalcemia), which may not require any active intervention.  -If her labs confirm hyperparathyroidism, she will be considered for DEXA scan to include the distal  33% of  radius for evaluation of cortical bone, which is predominantly affected by hyperparathyroidism.   Regarding her weight concern: This appears to be secondary to excessive caloric intake.  She has options to be considered one after the other including lifestyle modification, pharmacologic interventions, and surgery. - she  admits there is a room for improvement in her diet and drink choices. -  Suggestion is made for her to avoid simple carbohydrates  from her diet including Cakes, Sweet Desserts / Pastries, Ice Cream, Soda (diet and regular), Sweet Tea, Candies, Chips, Cookies, Sweet Pastries,  Store Bought Juices, Alcohol in Excess of  1-2 drinks a day, Artificial Sweeteners,  Coffee Creamer, and "Sugar-free" Products. This will help patient to avoid unintended weight gain. -She may also benefit from a consult with a nutritionist.  She will be considered for point-of-care A1c during her next visit.   - Time spent with the patient: 60 minutes, of which >50% was spent in obtaining information about her symptoms, reviewing her previous labs, evaluations, and treatments, counseling her about her hypercalcemia, obesity, and developing a plan to confirm the diagnosis and long term treatment as necessary.  Please refer to " Patient Self Inventory" in the Media  tab for reviewed elements of pertinent patient history.  Cecille Aver participated in the discussions, expressed understanding, and voiced agreement with the above plans.  All questions were answered to her satisfaction. she is encouraged to contact clinic should she have any questions or concerns prior to her return visit.  - Return in about 10 days (around 08/05/2019) for 24 Hr Urine Free Cortisol & Cr, 24 Hr Urine Ca & Cr, Labs Today- Non-Fasting Ok.   Marquis Lunch, MD [facility] [address] [place], [place] [address] Phone: [phone]  Fax: [phone]    This note was partially dictated with voice recognition software. Similar sounding words can be transcribed inadequately or may not  be corrected upon review.  07/26/2019, 6:19 PM

## 2019-08-16 ENCOUNTER — Ambulatory Visit: Payer: BC Managed Care – PPO | Admitting: "Endocrinology

## 2019-08-19 ENCOUNTER — Other Ambulatory Visit: Payer: Self-pay | Admitting: Adult Health

## 2019-09-21 ENCOUNTER — Other Ambulatory Visit (HOSPITAL_COMMUNITY): Payer: Self-pay | Admitting: Adult Health

## 2019-09-21 DIAGNOSIS — Z1231 Encounter for screening mammogram for malignant neoplasm of breast: Secondary | ICD-10-CM

## 2019-09-24 LAB — CORTISOL, URINE, 24 HOUR
24 Hour urine volume (VMAHVA): 2300 mL
Cortisol (Ur), Free: 2.5 mcg/24 h — ABNORMAL LOW (ref 4.0–50.0)
RESULTS RECEIVED: 0.74 g/(24.h) (ref 0.50–2.15)

## 2019-09-24 LAB — MAGNESIUM: Magnesium: 1.9 mg/dL (ref 1.5–2.5)

## 2019-09-24 LAB — CALCIUM, URINE, 24 HOUR: Calcium, 24H Urine: 32 mg/24 h — ABNORMAL LOW

## 2019-09-24 LAB — TSH: TSH: 2.88 mIU/L (ref 0.40–4.50)

## 2019-09-24 LAB — PTH, INTACT AND CALCIUM
Calcium: 10.1 mg/dL (ref 8.6–10.4)
PTH: 52 pg/mL (ref 14–64)

## 2019-09-24 LAB — T4, FREE: Free T4: 1.1 ng/dL (ref 0.8–1.8)

## 2019-09-24 LAB — PHOSPHORUS: Phosphorus: 3.9 mg/dL (ref 2.5–4.5)

## 2019-09-24 LAB — PTH-RELATED PEPTIDE: PTH-Related Protein (PTH-RP): 15 pg/mL (ref 14–27)

## 2019-09-26 ENCOUNTER — Encounter: Payer: Self-pay | Admitting: "Endocrinology

## 2019-09-26 ENCOUNTER — Ambulatory Visit: Payer: BC Managed Care – PPO | Admitting: "Endocrinology

## 2019-09-26 ENCOUNTER — Other Ambulatory Visit: Payer: Self-pay

## 2019-09-26 DIAGNOSIS — Z6834 Body mass index (BMI) 34.0-34.9, adult: Secondary | ICD-10-CM

## 2019-09-26 DIAGNOSIS — E6609 Other obesity due to excess calories: Secondary | ICD-10-CM

## 2019-09-26 MED ORDER — SAXENDA 18 MG/3ML ~~LOC~~ SOPN: 3.0000 mg | PEN_INJECTOR | Freq: Every day | SUBCUTANEOUS | 2 refills | Status: DC

## 2019-09-26 MED ORDER — BD PEN NEEDLE SHORT U/F 31G X 8 MM MISC: 1.0000 | 3 refills | Status: DC

## 2019-09-26 NOTE — Progress Notes (Signed)
09/26/2019, 6:27 PM   Endocrinology follow-up note  Deborah Lucero is a 59 y.o.-year-old female, returns for follow-up with repeat studies, initially was referred by her  [name], MD  , for evaluation for hypercalcemia/hyperparathyroidism.   Past Medical History:  Diagnosis Date  . Abdominal pain 05/05/2013  . Anxiety   . Anxiety and depression 10/04/2015  . Constipation 05/05/2013  . Hiatal hernia   . Mental disorder   . PONV (postoperative nausea and vomiting)   . Scoliosis   . Watery stools 05/05/2013    Past Surgical History:  Procedure Laterality Date  . BREAST BIOPSY Right 1986   Benign  . BREAST SURGERY     lumpectomy right breast-benign  . CESAREAN SECTION  06/24/1999  . DIAGNOSTIC LAPAROSCOPY    . HYSTEROSCOPY WITH THERMACHOICE  08/14/2011   Procedure: HYSTEROSCOPY WITH THERMACHOICE;  Surgeon: Lazaro Arms, MD;  Location: [facility];  Service: Gynecology;;  Thermachoice Endometrial Ablation Total Therapy Time=40min 44sec    Social History   Tobacco Use  . Smoking status: Never Smoker  . Smokeless tobacco: Never Used  Vaping Use  . Vaping Use: Never used  Substance Use Topics  . Alcohol use: Yes    Alcohol/week: 2.0 standard drinks    Types: 2 Glasses of wine per week    Comment: twice weekly  . Drug use: No    Family History  Problem Relation Age of Onset  . Alzheimer's disease Father   . Alzheimer's disease Sister     Outpatient Encounter Medications as of 09/26/2019  Medication Sig  . ALPRAZolam (XANAX) 0.5 MG tablet TAKE 1 TABLET(0.5 MG) BY MOUTH TWICE DAILY AS NEEDED FOR ANXIETY  . Ibuprofen (ADVIL PO) Take by mouth as needed.   . Insulin Pen Needle (B-D ULTRAFINE III SHORT PEN) 31G X 8 MM MISC 1 each by Does not apply route as directed.  . Liraglutide -Weight Management (SAXENDA) 18 MG/3ML SOPN Inject 0.5 mLs (3 mg total) into the skin daily with breakfast. Start 0.6mg  daily and advance by 0.6mg  weekly to max of 3 mg daily  .  pantoprazole (PROTONIX) 40 MG tablet Take 40 mg by mouth daily.  Marland Kitchen zolpidem (AMBIEN) 10 MG tablet TAKE 1/2 TO 1 TABLET BY MOUTH EVERY NIGHT AT BEDTIME   No facility-administered encounter medications on file as of 09/26/2019.    Allergies  Allergen Reactions  . Codeine Other (See Comments)    Hallucinations     HPI  Deborah Lucero was diagnosed with hypercalcemia on routine blood work on December 04, 2018 when it was 11.2.  She does not have any previously known history of parathyroid, adrenal, pituitary dysfunction.  Her repeat labs show calcium stabilizing at 10.1, PTH of 52, 24-hour urine calcium low at 32.   -She never had bone density scan done. No prior history of fragility fractures or falls. No history of  kidney stones.  No history of CKD.  She did have normal thyroid function tests.   She does not have history of malignancy.  she is not on HCTZ or other thiazide therapy.  No history of  vitamin D deficiency.  she is not on calcium supplements,  she eats dairy and green, leafy, vegetables on average amounts.  she does not have a family history of hypercalcemia, pituitary tumors, thyroid cancer, or osteoporosis.   Her other concern is progressive weight gain.  Over the last 5-6 years, she has gained 40 pounds progressively,  more rapidly in the last year.  She was exposed to steroids at least on one occasion.  She denies any history of diabetes/prediabetes.  She admits to some dietary indiscretions.  She lost 3 pounds since last visit.  She is interested to get pharmacologic intervention for weight control.   ROS:  Constitutional:  no fatigue, no subjective hyperthermia, no subjective hypothermia Eyes: no blurry vision, no xerophthalmia ENT: no sore throat, no nodules palpated in throat, no dysphagia/odynophagia, no hoarseness Cardiovascular: no Chest Pain, no Shortness of Breath, no palpitations, no leg swelling Respiratory: no cough, no shortness of breath   Gastrointestinal: no Nausea/Vomiting/Diarhhea Musculoskeletal: no muscle, + knee jpoint aches Skin: no rashes Neurological: no tremors, no numbness, no tingling, no dizziness Psychiatric: no depression, no anxiety  PE: BP 112/76   Pulse 72   Ht 5\' 5"  (1.651 m)   Wt (!) 203 lb 6.4 oz (92.3 kg)   BMI 33.85 kg/m , Body mass index is 33.85 kg/m. Wt Readings from Last 3 Encounters:  09/26/19 (!) 203 lb 6.4 oz (92.3 kg)  07/26/19 206 lb 9.6 oz (93.7 kg)  06/29/19 208 lb (94.3 kg)    Constitutional:  + BMI 33.85, not in acute distress, normal state of mind Eyes: PERRLA, EOMI, no exophthalmos ENT: moist mucous membranes, no gross thyromegaly, no gross cervical lymphadenopathy Cardiovascular: normal precordial activity, Regular Rate and Rhythm, no Murmur/Rubs/Gallops Respiratory:  adequate breathing efforts, no gross chest deformity, Clear to auscultation bilaterally Gastrointestinal: abdomen soft, Non -tender, No distension, Bowel Sounds present Musculoskeletal: no gross deformities, strength intact in all four extremities Skin: moist, warm, no rashes Neurological: no tremor with outstretched hands, Deep tendon reflexes normal in bilateral lower extremities.      Diabetic Labs (most recent): Lab Results  Component Value Date   HGBA1C 5.0 10/04/2015     Lipid Panel ( most recent) Lipid Panel     Component Value Date/Time   CHOL 230 (H) 12/31/2018 0855   TRIG 214 (H) 12/31/2018 0855   HDL 44 12/31/2018 0855   CHOLHDL 5.2 (H) 12/31/2018 0855   LDLCALC 147 (H) 12/31/2018 0855   LABVLDL 39 12/31/2018 0855      Lab Results  Component Value Date   TSH 2.88 09/20/2019   TSH 3.480 12/31/2018   TSH 3.210 10/04/2015   FREET4 1.1 09/20/2019    Recent Results (from the past 2160 hour(s))  Phosphorus     Status: None   Collection Time: 09/20/19  3:00 PM  Result Value Ref Range   Phosphorus 3.9 2.5 - 4.5 mg/dL  Magnesium     Status: None   Collection Time: 09/20/19  3:00 PM   Result Value Ref Range   Magnesium 1.9 1.5 - 2.5 mg/dL  PTH, intact and calcium     Status: None   Collection Time: 09/20/19  3:00 PM  Result Value Ref Range   PTH 52 14 - 64 pg/mL    Comment: . Interpretive Guide    Intact PTH           Calcium ------------------    ----------           ------- Normal Parathyroid    Normal               Normal Hypoparathyroidism    Low or Low Normal    Low Hyperparathyroidism    Primary            Normal or High       High  Secondary          High                 Normal or Low    Tertiary           High                 High Non-Parathyroid    Hypercalcemia      Low or Low Normal    High .    Calcium 10.1 8.6 - 10.4 mg/dL  PTH-related peptide     Status: None   Collection Time: 09/20/19  3:00 PM  Result Value Ref Range   PTH-Related Protein (PTH-RP) 15 14 - 27 pg/mL    Comment: . This is a C-terminal PTH-RP assay. PTH-RP is useful in the differential diagnosis of hypercalcemia and levels may be elevated in patients with tumor-associated hypercalcemia. Elevated results may also be observed in patients with renal disease. . This test was developed and its analytical performance characteristics have been determined by [facility]. It has not been cleared or approved by FDA. This assay has been validated pursuant to the CLIA regulations and is used for clinical purposes.   Calcium, urine, 24 hour     Status: Abnormal   Collection Time: 09/20/19  3:00 PM  Result Value Ref Range   Calcium, 24H Urine 32 (L) mg/24 h    Comment:                           Reference Range  35-250                            Low calcium diet 35-200   TSH     Status: None   Collection Time: 09/20/19  3:00 PM  Result Value Ref Range   TSH 2.88 0.40 - 4.50 mIU/L  T4, free     Status: None   Collection Time: 09/20/19  3:00 PM  Result Value Ref Range   Free T4 1.1 0.8 - 1.8 ng/dL  AJOINOMV,[facility],94 hour urine  w/creatinine     Status: Abnormal   Collection Time: 09/20/19  3:00 PM  Result Value Ref Range   24 Hour urine volume (VMAHVA) 2,300 mL   Cortisol (Ur), Free 2.5 (L) 4.0 - 50.0 mcg/24 h    Comment: . This test was developed and its analytical performance characteristics have been determined by [facility]. It has not been cleared or approved by FDA. This assay has been validated pursuant to the CLIA regulations and is used for clinical purposes.    RESULTS RECEIVED 0.74 0.50 - 2.15 g/24 h     Assessment: 1. Hypercalcemia  2.  Class I obesity  Plan: -Her recent labs are not suggestive of malignancy related hypercalcemia.  This is still possible to be mild normocalcemic primary hyperparathyroidism.  Calcium improved to 10.1, PTH 52, 24-hour urine calcium 32.  - No apparent complications from hypercalcemia/hyperparathyroidism: no history of  nephrolithiasis,  osteoporosis,fragility fractures. No abdominal pain, no major mood disorders, no bone pain. -She will not need pharmacologic nor surgical intervention at this time.  She will be kept on observation status. -She will have repeat PTH/calcium before her next visit in 3 months.  If she is found to have significant primary hyperparathyroidism, she will be considered for DEXA scan to include the distal  33% of  radius for evaluation of cortical bone, which is predominantly affected by hyperparathyroidism.   Regarding her weight concern: This appears to be secondary to excessive caloric intake.  She has options to be considered one after the other including lifestyle modification, pharmacologic interventions, and surgery. -She recently tried to modify her diet, lost 3 pounds.  She wants to explore her next option.  I discussed and added Saxenda starting from 0.6 mg daily to advance up to 3 mg subcutaneously daily.  -If she does not lose at least 30% of body weight by next visit this medication will  be discontinued.  Side effects and precautions discussed with her.   - she  admits there is a room for improvement in her diet and drink choices. -  Suggestion is made for her to avoid simple carbohydrates  from her diet including Cakes, Sweet Desserts / Pastries, Ice Cream, Soda (diet and regular), Sweet Tea, Candies, Chips, Cookies, Sweet Pastries,  Store Bought Juices, Alcohol in Excess of  1-2 drinks a day, Artificial Sweeteners, Coffee Creamer, and "Sugar-free" Products. This will help patient to have stable blood glucose profile and potentially avoid unintended weight gain.   -She may also benefit from a consult with a nutritionist.  She will be considered for point-of-care A1c during her next visit.     - Time spent on this patient care encounter:  35 minutes of which 50% was spent in  counseling and the rest reviewing  her current and  previous labs / studies and medications  doses and developing a plan for long term care. Deborah Lucero  participated in the discussions, expressed understanding, and voiced agreement with the above plans.  All questions were answered to her satisfaction. she is encouraged to contact clinic should she have any questions or concerns prior to her return visit.   - Return in about 3 months (around 12/27/2019) for F/U with Pre-visit Labs.   Marquis LunchGebre Ranell Finelli, MD [facility] [address] [place], [place] [address] Phone: [phone]  Fax: [phone]    This note was partially dictated with voice recognition software. Similar sounding words can be transcribed inadequately or may not  be corrected upon review.  09/26/2019, 6:27 PM

## 2019-09-26 NOTE — Patient Instructions (Signed)

## 2019-09-27 ENCOUNTER — Telehealth: Payer: Self-pay | Admitting: "Endocrinology

## 2019-09-27 NOTE — Telephone Encounter (Signed)
Left a message requesting a return call to the office. 

## 2019-09-27 NOTE — Telephone Encounter (Signed)
Pt is calling and states the new medication that was sent in yesterday Liraglutide -Weight Management (SAXENDA) 18 MG/3ML SOPN  Is needing a prior authorization and states [facility] has not heard back from [place].

## 2019-09-28 ENCOUNTER — Other Ambulatory Visit: Payer: Self-pay

## 2019-09-28 DIAGNOSIS — Z6834 Body mass index (BMI) 34.0-34.9, adult: Secondary | ICD-10-CM

## 2019-09-28 MED ORDER — BD PEN NEEDLE SHORT U/F 31G X 8 MM MISC: 3 refills | Status: DC

## 2019-09-28 NOTE — Telephone Encounter (Signed)
Discussed with pt that we have not received any authorization request from [facility].

## 2019-09-28 NOTE — Telephone Encounter (Signed)
Left a message requesting a return call to the office. 

## 2019-09-29 ENCOUNTER — Other Ambulatory Visit: Payer: BC Managed Care – PPO | Admitting: Adult Health

## 2019-09-29 ENCOUNTER — Other Ambulatory Visit: Payer: Self-pay | Admitting: Adult Health

## 2019-09-30 NOTE — Telephone Encounter (Signed)
Medication has been approved through the insurance company effective 09/29/19-01/30/20. Called pt to advise and had to leave voicemail.

## 2019-10-03 ENCOUNTER — Telehealth: Payer: Self-pay | Admitting: Adult Health

## 2019-10-03 ENCOUNTER — Other Ambulatory Visit: Payer: Self-pay | Admitting: Adult Health

## 2019-10-03 MED ORDER — ALPRAZOLAM 0.5 MG PO TABS: ORAL_TABLET | ORAL | 0 refills | Status: DC

## 2019-10-03 NOTE — Telephone Encounter (Signed)
Telephoned patient at home number. Patient states pharmacy advised her to contact office for refill on Alprazolam. Patient to check with pharmacy later.

## 2019-10-03 NOTE — Telephone Encounter (Signed)
Deborah Lucero aware that she needs appointment, will refill xanax x 1

## 2019-10-03 NOTE — Telephone Encounter (Signed)
Pt returned nurse call.

## 2019-10-03 NOTE — Telephone Encounter (Signed)
Telephoned patient at home number and left message to return call.  

## 2019-10-03 NOTE — Addendum Note (Signed)
Addended by: Cyril Mourning A on: 10/03/2019 05:04 PM   Modules accepted: Orders

## 2019-10-03 NOTE — Telephone Encounter (Signed)
Patient was returning a phone.

## 2019-10-03 NOTE — Telephone Encounter (Signed)
Patient called in regards to her refill. The pharmacy told her to reach out to [place] because they have not heard anything back.

## 2019-10-10 ENCOUNTER — Inpatient Hospital Stay (HOSPITAL_COMMUNITY): Admission: RE | Admit: 2019-10-10 | Payer: BC Managed Care – PPO | Source: Ambulatory Visit

## 2019-10-25 ENCOUNTER — Encounter: Payer: Self-pay | Admitting: Adult Health

## 2019-10-25 ENCOUNTER — Ambulatory Visit: Payer: BC Managed Care – PPO | Admitting: Adult Health

## 2019-10-25 VITALS — BP 123/81 | HR 79 | Ht 65.0 in | Wt 196.4 lb

## 2019-10-25 DIAGNOSIS — F329 Major depressive disorder, single episode, unspecified: Secondary | ICD-10-CM

## 2019-10-25 DIAGNOSIS — F32A Depression, unspecified: Secondary | ICD-10-CM

## 2019-10-25 DIAGNOSIS — F419 Anxiety disorder, unspecified: Secondary | ICD-10-CM | POA: Diagnosis not present

## 2019-10-25 MED ORDER — ALPRAZOLAM 0.5 MG PO TABS: ORAL_TABLET | ORAL | 0 refills | Status: DC

## 2019-10-25 MED ORDER — ZOLPIDEM TARTRATE 10 MG PO TABS: ORAL_TABLET | ORAL | 0 refills | Status: DC

## 2019-10-25 NOTE — Progress Notes (Signed)
  Subjective:     Patient ID: Deborah Lucero, female   DOB: 12/27/1960, 59 y.o.   MRN: [id number]  HPI Deborah Lucero is a 59 year old white female,divorced, PM back in follow up on taking xanax and Ambien, her sister died a year ago this month and she still gets teary. She has seen Dr Deborah Lucero and is on saxenda and has lost 10 lbs. Also she retired this year, and  Has not gotten in a routine yet. PCP is Dr Deborah Lucero  Review of Systems Still with anxiety, depression is better Reviewed past medical,surgical, social and family history. Reviewed medications and allergies.     Objective:   Physical Exam BP 123/81 (BP Location: Right Arm, Patient Position: Sitting, Cuff Size: Normal)   Pulse 79   Ht 5\' 5"  (1.651 m)   Wt 196 lb 6.4 oz (89.1 kg)   BMI 32.68 kg/m    Skin warm and dry.  Lungs: clear to ausculation bilaterally. Cardiovascular: regular rate and rhythm. PHQ 9 score is 2 Fall risk is low  Upstream - 10/25/19 1528      Pregnancy Intention Screening   Does the patient want to become pregnant in the next year? N/A    Does the patient's partner want to become pregnant in the next year? N/A    Would the patient like to discuss contraceptive options today? N/A      Contraception Wrap Up   Current Method No Method - Other Reason   Ablation   End Method No Method - Other Reason   Ablation   Contraception Counseling Provided No           Assessment:     1. Anxiety and depression Will refill xanax and Ambien but try not take Ambien every night Meds ordered this encounter  Medications  . zolpidem (AMBIEN) 10 MG tablet    Sig: TAKE 1/2 TO 1 TABLET BY MOUTH EVERY NIGHT AT BEDTIME    Dispense:  30 tablet    Refill:  0    Order Specific Question:   Supervising Provider    Answer:   10/27/19, Deborah Lucero [[id number]]  . ALPRAZolam (XANAX) 0.5 MG tablet    Sig: TAKE 1 TABLET(0.5 MG) BY MOUTH TWICE DAILY AS NEEDED FOR ANXIETY    Dispense:  60 tablet    Refill:  0    Order Specific Question:    Supervising Provider    Answer:   Deborah Lucero [[id number]]       Plan:    Follow up in 6 months

## 2019-11-02 ENCOUNTER — Other Ambulatory Visit: Payer: Self-pay | Admitting: Adult Health

## 2019-11-21 ENCOUNTER — Other Ambulatory Visit (HOSPITAL_COMMUNITY): Payer: Self-pay | Admitting: Adult Health

## 2019-11-21 DIAGNOSIS — Z1231 Encounter for screening mammogram for malignant neoplasm of breast: Secondary | ICD-10-CM

## 2019-11-22 ENCOUNTER — Emergency Department (HOSPITAL_COMMUNITY)
Admission: EM | Admit: 2019-11-22 | Discharge: 2019-11-22 | Disposition: A | Discharge status: Home / Self Care | Payer: BC Managed Care – PPO | Attending: Emergency Medicine | Admitting: Emergency Medicine

## 2019-11-22 ENCOUNTER — Other Ambulatory Visit: Payer: Self-pay

## 2019-11-22 ENCOUNTER — Encounter (HOSPITAL_COMMUNITY): Payer: Self-pay | Admitting: *Deleted

## 2019-11-22 ENCOUNTER — Emergency Department (HOSPITAL_COMMUNITY): Payer: BC Managed Care – PPO

## 2019-11-22 DIAGNOSIS — S51812A Laceration without foreign body of left forearm, initial encounter: Secondary | ICD-10-CM | POA: Diagnosis not present

## 2019-11-22 DIAGNOSIS — S51852A Open bite of left forearm, initial encounter: Secondary | ICD-10-CM | POA: Insufficient documentation

## 2019-11-22 DIAGNOSIS — S70311A Abrasion, right thigh, initial encounter: Secondary | ICD-10-CM | POA: Diagnosis not present

## 2019-11-22 DIAGNOSIS — Z23 Encounter for immunization: Secondary | ICD-10-CM | POA: Insufficient documentation

## 2019-11-22 DIAGNOSIS — S71131A Puncture wound without foreign body, right thigh, initial encounter: Secondary | ICD-10-CM | POA: Insufficient documentation

## 2019-11-22 DIAGNOSIS — S71151A Open bite, right thigh, initial encounter: Secondary | ICD-10-CM | POA: Insufficient documentation

## 2019-11-22 DIAGNOSIS — S51052A Open bite, left elbow, initial encounter: Secondary | ICD-10-CM | POA: Insufficient documentation

## 2019-11-22 DIAGNOSIS — Z794 Long term (current) use of insulin: Secondary | ICD-10-CM | POA: Insufficient documentation

## 2019-11-22 DIAGNOSIS — T07XXXA Unspecified multiple injuries, initial encounter: Secondary | ICD-10-CM

## 2019-11-22 DIAGNOSIS — W540XXA Bitten by dog, initial encounter: Secondary | ICD-10-CM | POA: Diagnosis not present

## 2019-11-22 DIAGNOSIS — S51032A Puncture wound without foreign body of left elbow, initial encounter: Secondary | ICD-10-CM | POA: Insufficient documentation

## 2019-11-22 DIAGNOSIS — S41112A Laceration without foreign body of left upper arm, initial encounter: Secondary | ICD-10-CM

## 2019-11-22 MED ORDER — TETANUS-DIPHTH-ACELL PERTUSSIS 5-2.5-18.5 LF-MCG/0.5 IM SUSP
0.5000 mL | Freq: Once | INTRAMUSCULAR | Status: AC
  Administered 2019-11-22: 0.5 mL via INTRAMUSCULAR
  Filled 2019-11-22: qty 0.5

## 2019-11-22 MED ORDER — POVIDONE-IODINE 10 % EX SOLN
CUTANEOUS | Status: AC
  Filled 2019-11-22: qty 15

## 2019-11-22 MED ORDER — AMOXICILLIN-POT CLAVULANATE 875-125 MG PO TABS
1.0000 | ORAL_TABLET | Freq: Once | ORAL | Status: AC
  Administered 2019-11-22: 1 via ORAL
  Filled 2019-11-22: qty 1

## 2019-11-22 MED ORDER — IBUPROFEN 400 MG PO TABS
600.0000 mg | ORAL_TABLET | Freq: Once | ORAL | Status: AC
  Administered 2019-11-22: 600 mg via ORAL
  Filled 2019-11-22: qty 2

## 2019-11-22 MED ORDER — AMOXICILLIN-POT CLAVULANATE 875-125 MG PO TABS: 1.0000 | ORAL_TABLET | Freq: Two times a day (BID) | ORAL | 0 refills | Status: DC

## 2019-11-22 MED ORDER — LIDOCAINE-EPINEPHRINE (PF) 2 %-1:200000 IJ SOLN
10.0000 mL | Freq: Once | INTRAMUSCULAR | Status: AC
  Administered 2019-11-22: 10 mL
  Filled 2019-11-22: qty 10

## 2019-11-22 NOTE — ED Notes (Signed)
Cleaned bite areas with saline and applied Telfa and Hydroflex tape.  Ice pack applied.

## 2019-11-22 NOTE — Discharge Instructions (Signed)
Keep wounds clean and covered, wash with gentle soap daily and change dressing daily.  Stitches on the wound on your left arm will need to be removed in 7 to 10 days.  Take antibiotics twice daily with food on your stomach to help prevent infection.  If you notice redness, warmth, swelling or puslike drainage coming from any of the wounds you should return for reevaluation.  You may use Motrin, Tylenol and ice as needed for pain.

## 2019-11-22 NOTE — ED Provider Notes (Signed)
[facility]GENCY DEPARTMENT Provider Note   CSN: [id number] Arrival date & time: 11/22/19  1408     History Chief Complaint  Patient presents with  . Animal Bite    Deborah Lucero is a 59 y.o. female.  Deborah Lucero is a 59 y.o. female with a history of anxiety and depression, scoliosis and constipation who presents who evaluation after she was attacked by two dogs. She states she was visiting with a friend and walked through the yard and the dogs came out of the house and attacked her biting her over her left elbow and forearm and well as her right thigh. Reports throbbing pain over bite sites, no numbness or weakness. She did not fall to the ground, she has a few puncture wounds and one larger open wound over the left forearm just below the elbow. Dogs belong to patient's friend and are up to date on rabies vaccination. Patient is not sure when her last tetanus vaccine was given.        Past Medical History:  Diagnosis Date  . Abdominal pain 05/05/2013  . Anxiety   . Anxiety and depression 10/04/2015  . Constipation 05/05/2013  . Hiatal hernia   . Mental disorder   . PONV (postoperative nausea and vomiting)   . Scoliosis   . Watery stools 05/05/2013    Patient Active Problem List   Diagnosis Date Noted  . Class 1 obesity due to excess calories without serious comorbidity with body mass index (BMI) of 34.0 to 34.9 in adult 07/26/2019  . Hypercalcemia 06/29/2019  . Encounter for gynecological examination with Papanicolaou smear of cervix 09/29/2018  . Screening for colorectal cancer 09/29/2018  . Chest wall muscle strain 09/29/2018  . Superficial fungus infection of skin 09/29/2018  . Hot flashes 09/29/2018  . Elevated cholesterol 09/29/2018  . Elevated BP without diagnosis of hypertension 09/29/2018  . Encounter for well woman exam with routine gynecological exam 11/04/2016  . Hormone replacement therapy (HRT) 11/04/2016  . Anxiety and depression 10/04/2015    . GERD (gastroesophageal reflux disease) 12/28/2013  . Abdominal pain 05/05/2013  . Constipation 05/05/2013  . Watery stools 05/05/2013  . Anxiety 03/15/2013  . Rash 11/15/2012    Past Surgical History:  Procedure Laterality Date  . BREAST BIOPSY Right 1986   Benign  . BREAST SURGERY     lumpectomy right breast-benign  . CESAREAN SECTION  06/24/1999  . DIAGNOSTIC LAPAROSCOPY    . HYSTEROSCOPY WITH THERMACHOICE  08/14/2011   Procedure: HYSTEROSCOPY WITH THERMACHOICE;  Surgeon: Lazaro ArmsLuther H Eure, MD;  Location: [facility];  Service: Gynecology;;  Thermachoice Endometrial Ablation Total Therapy Time=148min 44sec     OB History    Gravida  1   Para  1   Term      Preterm      AB      Living  2     SAB      TAB      Ectopic      Multiple  1   Live Births  2           Family History  Problem Relation Age of Onset  . Alzheimer's disease Father   . Alzheimer's disease Sister     Social History   Tobacco Use  . Smoking status: Never Smoker  . Smokeless tobacco: Never Used  Vaping Use  . Vaping Use: Never used  Substance Use Topics  . Alcohol use: Yes    Alcohol/week: 2.0 standard drinks  Types: 2 Glasses of wine per week    Comment: twice weekly  . Drug use: No    Home Medications Prior to Admission medications   Medication Sig Start Date End Date Taking? Authorizing Provider  ALPRAZolam Prudy Feeler) 0.5 MG tablet TAKE 1 TABLET(0.5 MG) BY MOUTH TWICE DAILY AS NEEDED FOR ANXIETY 10/25/19   Cyril Mourning A, NP  Ibuprofen (ADVIL PO) Take by mouth as needed.     [provider]  Insulin Pen Needle (B-D ULTRAFINE III SHORT PEN) 31G X 8 MM MISC Use once daily as directed with Saxenda injection 09/28/19   Nida, Denman George, MD  Liraglutide -Weight Management (SAXENDA) 18 MG/3ML SOPN Inject 0.5 mLs (3 mg total) into the skin daily with breakfast. Start 0.6mg  daily and advance by 0.6mg  weekly to max of 3 mg daily 09/26/19   Roma Kayser, MD   pantoprazole (PROTONIX) 40 MG tablet Take 40 mg by mouth daily. 12/16/18   [provider]  zolpidem (AMBIEN) 10 MG tablet TAKE 1/2 TO 1 TABLET BY MOUTH EVERY NIGHT AT BEDTIME 10/25/19   Adline Potter, NP    Allergies    Codeine  Review of Systems   Review of Systems  Constitutional: Negative for chills and fever.  Skin: Positive for wound.  Neurological: Negative for weakness and numbness.  All other systems reviewed and are negative.   Physical Exam Updated Vital Signs BP 138/86 (BP Location: Right Arm)   Pulse 92   Resp 18   Ht 5\' 5"  (1.651 m)   Wt 85.7 kg   SpO2 100%   BMI 31.45 kg/m   Physical Exam Vitals and nursing note reviewed.  Constitutional:      General: She is not in acute distress.    Appearance: Normal appearance. She is well-developed. She is not ill-appearing or diaphoretic.     Comments: Well-appearing and in no distress  HENT:     Head: Normocephalic and atraumatic.  Eyes:     General:        Right eye: No discharge.        Left eye: No discharge.  Pulmonary:     Effort: Pulmonary effort is normal. No respiratory distress.  Musculoskeletal:     Cervical back: Neck supple.     Comments: Two puncture wounds noted over the posterior aspect of the left elbow, there is a larger open wound over the posterior forearm just below the elbow, approximately 4 cm L shaped laceration with subcutaneous tissue visible, no visualized tendon, normal strength and sensation. Superficial abrasion about 3x3 cm to the posterior right thigh with two small puncture wounds  Skin:    General: Skin is warm and dry.  Neurological:     Mental Status: She is alert and oriented to person, place, and time.     Coordination: Coordination normal.  Psychiatric:        Mood and Affect: Mood normal.        Behavior: Behavior normal.     ED Results / Procedures / Treatments   Labs (all labs ordered are listed, but only abnormal results are displayed) Labs  Reviewed - No data to display  EKG None  Radiology DG Elbow Complete Left  Result Date: 11/22/2019 CLINICAL DATA:  Dog bite, assess for foreign body EXAM: LEFT ELBOW - COMPLETE 3+ VIEW COMPARISON:  None. FINDINGS: There is no evidence of fracture, dislocation, or joint effusion. There is no evidence of arthropathy or other focal bone abnormality. Large soft  tissue defect with associated edema and emphysema of the soft tissues of the elbow. No retained radiopaque foreign body. IMPRESSION: 1. Large soft tissue defect with no retained radiopaque foreign body. 2. No acute fracture or dislocation of the bones of the left elbow. Electronically Signed   By: Tish Frederickson M.D.   On: 11/22/2019 17:22   DG Femur Min 2 Views Right  Result Date: 11/22/2019 CLINICAL DATA:  Dog bite EXAM: RIGHT FEMUR 2 VIEWS COMPARISON:  None. FINDINGS: There is no acute displaced fracture or dislocation. There is a small radiopaque density located within the medial soft tissues at the level of the distal femur. This appears to be adjacent to several pockets of subcutaneous gas. IMPRESSION: 1. No acute displaced fracture or dislocation. 2. Small radiopaque density within the medial soft tissues at the level of the distal femur. This appears to be adjacent to several pockets of subcutaneous gas and is suspicious for a foreign body. Electronically Signed   By: Katherine Mantle M.D.   On: 11/22/2019 17:09    Procedures .Marland KitchenLaceration Repair  Date/Time: 11/22/2019 7:34 PM Performed by: Dartha Lodge, PA-C Authorized by: Dartha Lodge, PA-C   Consent:    Consent obtained:  Verbal   Consent given by:  Patient   Risks discussed:  Infection, pain, need for additional repair, poor cosmetic result, poor wound healing and nerve damage   Alternatives discussed:  No treatment Anesthesia (see MAR for exact dosages):    Anesthesia method:  Local infiltration   Local anesthetic:  Lidocaine 2% WITH epi Laceration details:     Location:  Shoulder/arm   Shoulder/arm location:  L lower arm   Length (cm):  4   Depth (mm):  5 Repair type:    Repair type:  Simple Pre-procedure details:    Preparation:  Patient was prepped and draped in usual sterile fashion and imaging obtained to evaluate for foreign bodies Exploration:    Hemostasis achieved with:  Epinephrine and direct pressure   Wound exploration: wound explored through full range of motion and entire depth of wound probed and visualized     Wound extent: areolar tissue violated     Wound extent: no foreign bodies/material noted, no tendon damage noted and no underlying fracture noted   Treatment:    Area cleansed with:  Saline and Betadine   Amount of cleaning:  Extensive   Irrigation solution:  Sterile saline   Irrigation method:  Pressure wash Skin repair:    Repair method:  Sutures   Suture size:  5-0   Suture material:  Prolene   Suture technique:  Simple interrupted   Number of sutures:  5 Approximation:    Approximation:  Loose Post-procedure details:    Dressing:  Bulky dressing   Patient tolerance of procedure:  Tolerated well, no immediate complications   (including critical care time)  Medications Ordered in ED Medications  amoxicillin-clavulanate (AUGMENTIN) 875-125 MG per tablet 1 tablet (has no administration in time range)  lidocaine-EPINEPHrine (XYLOCAINE W/EPI) 2 %-1:200000 (PF) injection 10 mL (10 mLs Infiltration Given 11/22/19 1632)  Tdap (BOOSTRIX) injection 0.5 mL (0.5 mLs Intramuscular Given 11/22/19 1631)  povidone-iodine (BETADINE) 10 % external solution (  Given 11/22/19 1633)  ibuprofen (ADVIL) tablet 600 mg (600 mg Oral Given 11/22/19 1735)    ED Course  I have reviewed the triage vital signs and the nursing notes.  Pertinent labs & imaging results that were available during my care of the patient were reviewed by  me and considered in my medical decision making (see chart for details).    MDM Rules/Calculators/A&P                           59 yo F presents after he was attacked by two dogs, who are up to date on rabies. Tetanus vaccine updated today. Multiple small puncture wounds and an abrasion, but wound on left forearm large enough to warrant loose approximation. All wounds were copiously irrigated. X-rays with no large foreign bodies, XR of the right thigh with possible tiny superficial foreign body, not visualized and likely removed with irrigation. Forearm wound approximated with sutures. Procedure tolerated well by patient . Prescribed Augmentin for infection prophylaxis and discussed appropriate wound care and infection precautions. Suture removal in 7-10 days. Pt expresses understanding and agrees with plan. Discharged home in good condition.  Final Clinical Impression(s) / ED Diagnoses Final diagnoses:  Dog bite, initial encounter  Multiple puncture wounds  Laceration of left upper extremity, initial encounter    Rx / DC Orders ED Discharge Orders         Ordered    amoxicillin-clavulanate (AUGMENTIN) 875-125 MG tablet  2 times daily        11/22/19 1757           Dartha Lodge, PA-C 11/25/19 1351    Terrilee Files, MD 11/25/19 1620

## 2019-11-22 NOTE — ED Triage Notes (Signed)
Pt states she was attacked by 2 dogs; the dogs were friends of pt and they were up to date on all vaccines; pt has 2 puncture wounds to the left elbow and one open wound on the anterior lower left arm; pt has 2 puncture wounds and an abrasion to back of upper right thigh

## 2019-11-28 ENCOUNTER — Ambulatory Visit (HOSPITAL_COMMUNITY): Payer: BC Managed Care – PPO

## 2019-12-01 ENCOUNTER — Ambulatory Visit (INDEPENDENT_AMBULATORY_CARE_PROVIDER_SITE_OTHER): Payer: BC Managed Care – PPO | Admitting: General Surgery

## 2019-12-01 ENCOUNTER — Encounter: Payer: Self-pay | Admitting: General Surgery

## 2019-12-01 ENCOUNTER — Other Ambulatory Visit: Payer: Self-pay

## 2019-12-01 VITALS — BP 107/74 | HR 94 | Temp 98.2°F | Resp 16 | Ht 65.0 in | Wt 193.0 lb

## 2019-12-01 DIAGNOSIS — S41112S Laceration without foreign body of left upper arm, sequela: Secondary | ICD-10-CM | POA: Diagnosis not present

## 2019-12-01 DIAGNOSIS — T07XXXS Unspecified multiple injuries, sequela: Secondary | ICD-10-CM | POA: Diagnosis not present

## 2019-12-01 DIAGNOSIS — W540XXD Bitten by dog, subsequent encounter: Secondary | ICD-10-CM

## 2019-12-01 NOTE — Progress Notes (Signed)
Subjective:     Deborah Lucero  Patient presents for suture removal after undergoing suture placement in the emergency room after a dog bite. Objective:    BP 107/74   Pulse 94   Temp 98.2 F (36.8 C) (Oral)   Resp 16   Ht 5\' 5"  (1.651 m)   Wt 193 lb (87.5 kg)   SpO2 97%   BMI 32.12 kg/m   General:  alert, cooperative and no distress  Left forearm wound healing well.  Sutures removed and Steri-Strips applied.  No purulent drainage noted. Left upper posterior thigh wound healing well by secondary intention.     Assessment:    Left forearm suture removal after dog bite.    Plan:   Keep wounds clean and dry.  Follow-up here as needed.

## 2019-12-02 ENCOUNTER — Ambulatory Visit (HOSPITAL_COMMUNITY): Payer: BC Managed Care – PPO

## 2019-12-05 ENCOUNTER — Other Ambulatory Visit: Payer: Self-pay | Admitting: Adult Health

## 2019-12-06 ENCOUNTER — Ambulatory Visit (INDEPENDENT_AMBULATORY_CARE_PROVIDER_SITE_OTHER): Payer: BC Managed Care – PPO | Admitting: General Surgery

## 2019-12-06 ENCOUNTER — Encounter: Payer: Self-pay | Admitting: General Surgery

## 2019-12-06 ENCOUNTER — Other Ambulatory Visit: Payer: Self-pay

## 2019-12-06 VITALS — BP 104/75 | HR 79 | Temp 97.8°F | Resp 16 | Ht 65.0 in | Wt 192.0 lb

## 2019-12-06 DIAGNOSIS — L03114 Cellulitis of left upper limb: Secondary | ICD-10-CM

## 2019-12-06 MED ORDER — AMOXICILLIN-POT CLAVULANATE 875-125 MG PO TABS: 1.0000 | ORAL_TABLET | Freq: Two times a day (BID) | ORAL | 0 refills | Status: DC

## 2019-12-06 NOTE — Progress Notes (Signed)
Subjective:     Deborah Lucero  Patient presents back with redness and itching in the left arm.  No purulent drainage has been noted from the wound.  She denies any fevers.  She was on Augmentin after the dog bite, but has since been off of it.  This started over the last 48 hours. Objective:    BP 104/75   Pulse 79   Temp 97.8 F (36.6 C) (Oral)   Resp 16   Ht 5\' 5"  (1.651 m)   Wt 192 lb (87.1 kg)   SpO2 96%   BMI 31.95 kg/m   General:  alert, cooperative and no distress  Left arm with healed wound.  No purulent drainage is present.  She does have a rash with some edema just proximal to the wound and above the elbow.  Slight erythema is noted.  No induration or abscess cavity identified.     Assessment:    Cellulitis of left arm, status post repair of dog bite in the left arm    Plan:   Patient should continue Benadryl.  Will reorder Augmentin 875mg  p.o. twice daily for 1 week.  Follow-up as needed.

## 2019-12-25 ENCOUNTER — Other Ambulatory Visit: Payer: Self-pay | Admitting: "Endocrinology

## 2019-12-26 ENCOUNTER — Other Ambulatory Visit: Payer: Self-pay | Admitting: "Endocrinology

## 2019-12-28 ENCOUNTER — Ambulatory Visit: Payer: BC Managed Care – PPO | Admitting: "Endocrinology

## 2020-01-03 ENCOUNTER — Other Ambulatory Visit: Payer: Self-pay | Admitting: Adult Health

## 2020-02-06 ENCOUNTER — Telehealth: Payer: Self-pay | Admitting: Adult Health

## 2020-02-06 ENCOUNTER — Other Ambulatory Visit: Payer: Self-pay | Admitting: Women's Health

## 2020-02-06 MED ORDER — ALPRAZOLAM 0.5 MG PO TABS: ORAL_TABLET | ORAL | 0 refills | Status: DC

## 2020-02-06 NOTE — Telephone Encounter (Signed)
Pt requesting refill on her Xanax?  We rec'd refill request from pharmacy but it looks like [facility] asked for it to be sent to [facility]  Please advise & notify pt    [facility] [address]

## 2020-02-06 NOTE — Addendum Note (Signed)
Addended by: Cyril Mourning A on: 02/06/2020 05:03 PM   Modules accepted: Orders

## 2020-02-06 NOTE — Telephone Encounter (Signed)
Refilled xanax

## 2020-03-08 ENCOUNTER — Other Ambulatory Visit: Payer: Self-pay

## 2020-03-08 DIAGNOSIS — Z20822 Contact with and (suspected) exposure to covid-19: Secondary | ICD-10-CM

## 2020-03-09 ENCOUNTER — Other Ambulatory Visit: Payer: Self-pay | Admitting: Adult Health

## 2020-03-10 LAB — SPECIMEN STATUS REPORT

## 2020-03-10 LAB — NOVEL CORONAVIRUS, NAA

## 2020-04-10 ENCOUNTER — Other Ambulatory Visit: Payer: Self-pay | Admitting: Adult Health

## 2020-05-17 ENCOUNTER — Other Ambulatory Visit: Payer: Self-pay | Admitting: Adult Health

## 2020-05-21 ENCOUNTER — Other Ambulatory Visit: Payer: Self-pay | Admitting: Adult Health

## 2020-06-20 ENCOUNTER — Other Ambulatory Visit: Payer: Self-pay | Admitting: Adult Health

## 2020-07-23 ENCOUNTER — Other Ambulatory Visit: Payer: Self-pay | Admitting: Adult Health

## 2020-07-24 ENCOUNTER — Other Ambulatory Visit: Payer: Self-pay

## 2020-07-24 ENCOUNTER — Telehealth: Payer: Self-pay | Admitting: Adult Health

## 2020-07-24 MED ORDER — ALPRAZOLAM 0.5 MG PO TABS: 0.5000 mg | ORAL_TABLET | Freq: Every evening | ORAL | 0 refills | Status: DC | PRN

## 2020-07-24 NOTE — Telephone Encounter (Signed)
Refilled xanax

## 2020-07-24 NOTE — Telephone Encounter (Signed)
Pt called & scheduled appt 6/1 with Victorino Dike, however she's out of her alprazolam & takes it at night due to trouble sleeping Can we refill until pt can be seen?   Please advise & notify pt     [facility] [address]

## 2020-08-01 ENCOUNTER — Ambulatory Visit (INDEPENDENT_AMBULATORY_CARE_PROVIDER_SITE_OTHER): Payer: BC Managed Care – PPO | Admitting: Adult Health

## 2020-08-01 ENCOUNTER — Other Ambulatory Visit: Payer: Self-pay

## 2020-08-01 ENCOUNTER — Encounter: Payer: Self-pay | Admitting: Adult Health

## 2020-08-01 VITALS — BP 117/83 | HR 86 | Ht 65.0 in | Wt 186.5 lb

## 2020-08-01 DIAGNOSIS — R21 Rash and other nonspecific skin eruption: Secondary | ICD-10-CM | POA: Diagnosis not present

## 2020-08-01 DIAGNOSIS — G479 Sleep disorder, unspecified: Secondary | ICD-10-CM

## 2020-08-01 MED ORDER — TRAZODONE HCL 50 MG PO TABS: 50.0000 mg | ORAL_TABLET | Freq: Every evening | ORAL | 6 refills | Status: DC | PRN

## 2020-08-01 MED ORDER — TRIAMCINOLONE ACETONIDE 0.5 % EX OINT: 1.0000 "application " | TOPICAL_OINTMENT | Freq: Two times a day (BID) | CUTANEOUS | 3 refills | Status: DC

## 2020-08-01 NOTE — Progress Notes (Signed)
Subjective:     Patient ID: Deborah Lucero, female   DOB: [birth year], 60 y.o.   MRN: [id number]  HPI  Deborah Lucero is a 60 year old white female, divorced, PM in complaining of rash on arms and under breast, and not sleeping. PCP is Dr Ouida Sills  Review of Systems +rash that itches Not sleeping uses xanax Can't lose weight  Reviewed past medical,surgical, social and family history. Reviewed medications and allergies.     Objective:   Physical Exam BP 117/83 (BP Location: Right Arm, Patient Position: Sitting, Cuff Size: Normal)   Pulse 86   Ht 5\' 5"  (1.651 m)   Wt 186 lb 8 oz (84.6 kg)   BMI 31.04 kg/m  Skin warm and dry.  Lungs: clear to ausculation bilaterally. Cardiovascular: regular rate and rhythm. Has fine red rash inner arms and under breast. Fall risk is low  Upstream - 08/01/20 1148      Pregnancy Intention Screening   Does the patient want to become pregnant in the next year? N/A    Does the patient's partner want to become pregnant in the next year? N/A    Would the patient like to discuss contraceptive options today? N/A      Contraception Wrap Up   Current Method No Method - Other Reason   postmenopausal; ablation   End Method No Method - Other Reason   postmenopausal; ablation   Contraception Counseling Provided No          Depression screen [facility] 2/9 08/01/2020 10/25/2019 09/29/2018  Decreased Interest 0 1 3  Down, Depressed, Hopeless 0 0 2  PHQ - 2 Score 0 1 5  Altered sleeping 1 1 3   Tired, decreased energy 1 0 1  Change in appetite 2 0 3  Feeling bad or failure about yourself  1 0 1  Trouble concentrating 0 0 1  Moving slowly or fidgety/restless - 0 0  Suicidal thoughts 0 0 0  PHQ-9 Score 5 2 14   Difficult doing work/chores Not difficult at all Not difficult at all -  Some recent data might be hidden   GAD 7 : Generalized Anxiety Score 08/01/2020 10/25/2019  Nervous, Anxious, on Edge 1 0  Control/stop worrying 0 0  Worry too much - different things 0 0   Trouble relaxing 1 0  Restless 1 0  Easily annoyed or irritable 1 0  Afraid - awful might happen 1 0  Total GAD 7 Score 5 0  Anxiety Difficulty Not difficult at all Not difficult at all       Assessment:     1. Rash Will rx triamcinolone ointment   2. Sleep disturbance Will wean off xanax and try trazodone  Meds ordered this encounter  Medications  . traZODone (DESYREL) 50 MG tablet    Sig: Take 1 tablet (50 mg total) by mouth at bedtime as needed for sleep.    Dispense:  30 tablet    Refill:  6    Order Specific Question:   Supervising Provider    Answer:   , LUTHER H [[id number]]  . triamcinolone ointment (KENALOG) 0.5 %    Sig: Apply 1 application topically 2 (two) times daily.    Dispense:  30 g    Refill:  3    Order Specific Question:   Supervising Provider    Answer:   10/01/2020 [[id number]]      Plan:    Follow up in 4 weeks to discuss weight and sleep

## 2020-08-06 ENCOUNTER — Ambulatory Visit (HOSPITAL_COMMUNITY)
Admission: RE | Admit: 2020-08-06 | Discharge: 2020-08-06 | Disposition: A | Discharge status: Home / Self Care | Payer: BC Managed Care – PPO | Source: Ambulatory Visit | Attending: Adult Health | Admitting: Adult Health

## 2020-08-06 ENCOUNTER — Encounter (HOSPITAL_COMMUNITY): Payer: Self-pay

## 2020-08-06 DIAGNOSIS — Z1231 Encounter for screening mammogram for malignant neoplasm of breast: Secondary | ICD-10-CM | POA: Insufficient documentation

## 2020-08-29 ENCOUNTER — Other Ambulatory Visit: Payer: Self-pay

## 2020-08-29 ENCOUNTER — Encounter: Payer: Self-pay | Admitting: Adult Health

## 2020-08-29 ENCOUNTER — Ambulatory Visit (INDEPENDENT_AMBULATORY_CARE_PROVIDER_SITE_OTHER): Payer: BC Managed Care – PPO | Admitting: Adult Health

## 2020-08-29 VITALS — BP 124/84 | HR 81 | Ht 65.0 in | Wt 185.0 lb

## 2020-08-29 DIAGNOSIS — Z6830 Body mass index (BMI) 30.0-30.9, adult: Secondary | ICD-10-CM

## 2020-08-29 DIAGNOSIS — Z713 Dietary counseling and surveillance: Secondary | ICD-10-CM | POA: Diagnosis not present

## 2020-08-29 DIAGNOSIS — G479 Sleep disorder, unspecified: Secondary | ICD-10-CM | POA: Diagnosis not present

## 2020-08-29 MED ORDER — PHENTERMINE HCL 37.5 MG PO TABS: 37.5000 mg | ORAL_TABLET | Freq: Every day | ORAL | 0 refills | Status: DC

## 2020-08-29 NOTE — Progress Notes (Signed)
  Subjective:     Patient ID: Deborah Lucero, female   DOB: April 26, 1960, 60 y.o.   MRN: [id number]  HPI Deborah Lucero is a 60 year old white female,divorced, PM back in follow up on weaning off xanax and trying tazodone for sleep and doing OK. She wants to try adipex again for weight loss, has lost 1.8 lbs. PCP is Dr Ouida Sills.  Review of Systems Can sleep but not all night +wants to lose weight Reviewed past medical,surgical, social and family history. Reviewed medications and allergies.     Objective:   Physical Exam BP 124/84 (BP Location: Left Arm, Patient Position: Sitting, Cuff Size: Normal)   Pulse 81   Ht 5\' 5"  (1.651 m)   Wt 185 lb (83.9 kg)   BMI 30.79 kg/m  Skin warm and dry. Lungs: clear to ausculation bilaterally. Cardiovascular: regular rate and rhythm.      Upstream - 08/29/20 1431       Pregnancy Intention Screening   Does the patient want to become pregnant in the next year? No    Does the patient's partner want to become pregnant in the next year? No    Would the patient like to discuss contraceptive options today? No      Contraception Wrap Up   Current Method --   pm   End Method --   pm   Contraception Counseling Provided No             Assessment:     1. Weight loss counseling, encounter for Continue walking Try to decrease carbs and calories Will rx adipex Meds ordered this encounter  Medications   phentermine (ADIPEX-P) 37.5 MG tablet    Sig: Take 1 tablet (37.5 mg total) by mouth daily before breakfast.    Dispense:  30 tablet    Refill:  0    Order Specific Question:   Supervising Provider    Answer:   08/31/20, LUTHER H [[id number]]     2. Body mass index 30.0-30.9, adult   3. Sleep disturbance Continue trazodone, has refills    Plan:     Follow up in 4 weeks for weight and BP check

## 2020-08-31 ENCOUNTER — Other Ambulatory Visit: Payer: Self-pay | Admitting: Adult Health

## 2020-09-26 ENCOUNTER — Ambulatory Visit: Payer: BC Managed Care – PPO | Admitting: Adult Health

## 2020-10-18 ENCOUNTER — Ambulatory Visit: Payer: BC Managed Care – PPO | Admitting: Adult Health

## 2020-11-02 ENCOUNTER — Emergency Department (HOSPITAL_COMMUNITY): Payer: BC Managed Care – PPO

## 2020-11-02 ENCOUNTER — Observation Stay (HOSPITAL_COMMUNITY)
Admission: EM | Admit: 2020-11-02 | Discharge: 2020-11-03 | Disposition: A | Discharge status: Home / Self Care | Payer: BC Managed Care – PPO | Attending: Internal Medicine | Admitting: Internal Medicine

## 2020-11-02 ENCOUNTER — Encounter (HOSPITAL_COMMUNITY): Payer: Self-pay | Admitting: *Deleted

## 2020-11-02 ENCOUNTER — Other Ambulatory Visit: Payer: Self-pay

## 2020-11-02 DIAGNOSIS — R4182 Altered mental status, unspecified: Secondary | ICD-10-CM

## 2020-11-02 DIAGNOSIS — Y900 Blood alcohol level of less than 20 mg/100 ml: Secondary | ICD-10-CM | POA: Diagnosis not present

## 2020-11-02 DIAGNOSIS — R569 Unspecified convulsions: Principal | ICD-10-CM | POA: Insufficient documentation

## 2020-11-02 DIAGNOSIS — Z20822 Contact with and (suspected) exposure to covid-19: Secondary | ICD-10-CM | POA: Insufficient documentation

## 2020-11-02 DIAGNOSIS — Z7289 Other problems related to lifestyle: Secondary | ICD-10-CM

## 2020-11-02 DIAGNOSIS — F419 Anxiety disorder, unspecified: Secondary | ICD-10-CM

## 2020-11-02 DIAGNOSIS — Z789 Other specified health status: Secondary | ICD-10-CM

## 2020-11-02 DIAGNOSIS — E872 Acidosis, unspecified: Secondary | ICD-10-CM

## 2020-11-02 DIAGNOSIS — R410 Disorientation, unspecified: Secondary | ICD-10-CM | POA: Diagnosis not present

## 2020-11-02 DIAGNOSIS — K219 Gastro-esophageal reflux disease without esophagitis: Secondary | ICD-10-CM | POA: Diagnosis present

## 2020-11-02 DIAGNOSIS — E871 Hypo-osmolality and hyponatremia: Secondary | ICD-10-CM

## 2020-11-02 DIAGNOSIS — R739 Hyperglycemia, unspecified: Secondary | ICD-10-CM

## 2020-11-02 DIAGNOSIS — G4700 Insomnia, unspecified: Secondary | ICD-10-CM

## 2020-11-02 LAB — COMPREHENSIVE METABOLIC PANEL
ALT: 32 U/L (ref 0–44)
AST: 33 U/L (ref 15–41)
Albumin: 4.4 g/dL (ref 3.5–5.0)
Alkaline Phosphatase: 84 U/L (ref 38–126)
Anion gap: 11 (ref 5–15)
BUN: 10 mg/dL (ref 6–20)
CO2: 19 mmol/L — ABNORMAL LOW (ref 22–32)
Calcium: 8.9 mg/dL (ref 8.9–10.3)
Chloride: 94 mmol/L — ABNORMAL LOW (ref 98–111)
Creatinine, Ser: 0.89 mg/dL (ref 0.44–1.00)
GFR, Estimated: >60 mL/min (ref >60)
Glucose, Bld: 144 mg/dL — ABNORMAL HIGH (ref 70–99)
Potassium: 3.5 mmol/L (ref 3.5–5.1)
Sodium: 124 mmol/L — ABNORMAL LOW (ref 135–145)
Total Bilirubin: 0.6 mg/dL (ref 0.3–1.2)
Total Protein: 7.2 g/dL (ref 6.5–8.1)

## 2020-11-02 LAB — CBC WITH DIFFERENTIAL/PLATELET
Abs Immature Granulocytes: 0.12 10*3/uL — ABNORMAL HIGH (ref 0.00–0.07)
Basophils Absolute: 0 10*3/uL (ref 0.0–0.1)
Basophils Relative: 0 %
Eosinophils Absolute: 0 10*3/uL (ref 0.0–0.5)
Eosinophils Relative: 0 %
HCT: 40.3 % (ref 36.0–46.0)
Hemoglobin: 13.8 g/dL (ref 12.0–15.0)
Immature Granulocytes: 1 %
Lymphocytes Relative: 12 %
Lymphs Abs: 1.1 10*3/uL (ref 0.7–4.0)
MCH: 30.9 pg (ref 26.0–34.0)
MCHC: 34.2 g/dL (ref 30.0–36.0)
MCV: 90.2 fL (ref 80.0–100.0)
Monocytes Absolute: 0.3 10*3/uL (ref 0.1–1.0)
Monocytes Relative: 4 %
Neutro Abs: 7.6 10*3/uL (ref 1.7–7.7)
Neutrophils Relative %: 83 %
Platelets: 315 10*3/uL (ref 150–400)
RBC: 4.47 MIL/uL (ref 3.87–5.11)
RDW: 12.5 % (ref 11.5–15.5)
WBC: 9.2 10*3/uL (ref 4.0–10.5)
nRBC: 0 % (ref 0.0–0.2)

## 2020-11-02 LAB — URINALYSIS, ROUTINE W REFLEX MICROSCOPIC
Bilirubin Urine: NEGATIVE
Glucose, UA: NEGATIVE mg/dL
Ketones, ur: NEGATIVE mg/dL
Leukocytes,Ua: NEGATIVE
Nitrite: NEGATIVE
Protein, ur: NEGATIVE mg/dL
Specific Gravity, Urine: 1.015 (ref 1.005–1.030)
pH: 6 (ref 5.0–8.0)

## 2020-11-02 LAB — I-STAT BETA HCG BLOOD, ED (MC, WL, AP ONLY): I-stat hCG, quantitative: 5.1 m[IU]/mL — ABNORMAL HIGH (ref <5)

## 2020-11-02 LAB — ETHANOL: Alcohol, Ethyl (B): <10 mg/dL (ref <10)

## 2020-11-02 LAB — PREGNANCY, URINE: Preg Test, Ur: NEGATIVE

## 2020-11-02 LAB — RAPID URINE DRUG SCREEN, HOSP PERFORMED
Amphetamines: NOT DETECTED
Barbiturates: NOT DETECTED
Benzodiazepines: NOT DETECTED
Cocaine: NOT DETECTED
Opiates: NOT DETECTED
Tetrahydrocannabinol: NOT DETECTED

## 2020-11-02 LAB — URINALYSIS, MICROSCOPIC (REFLEX): Bacteria, UA: NONE SEEN

## 2020-11-02 LAB — RESP PANEL BY RT-PCR (FLU A&B, COVID) ARPGX2
Influenza A by PCR: NEGATIVE
Influenza B by PCR: NEGATIVE
SARS Coronavirus 2 by RT PCR: NEGATIVE

## 2020-11-02 LAB — LACTIC ACID, PLASMA
Lactic Acid, Venous: 4.2 mmol/L (ref 0.5–1.9)
Lactic Acid, Venous: 1.2 mmol/L (ref 0.5–1.9)

## 2020-11-02 LAB — SODIUM, URINE, RANDOM: Sodium, Ur: 19 mmol/L

## 2020-11-02 MED ORDER — ENOXAPARIN SODIUM 40 MG/0.4ML IJ SOSY
40.0000 mg | PREFILLED_SYRINGE | INTRAMUSCULAR | Status: DC
  Administered 2020-11-02: 40 mg via SUBCUTANEOUS
  Filled 2020-11-02: qty 0.4

## 2020-11-02 MED ORDER — ACETAMINOPHEN 325 MG PO TABS
650.0000 mg | ORAL_TABLET | Freq: Four times a day (QID) | ORAL | Status: DC | PRN
  Administered 2020-11-02: 650 mg via ORAL
  Filled 2020-11-02: qty 2

## 2020-11-02 MED ORDER — SODIUM CHLORIDE 0.9 % IV BOLUS
1000.0000 mL | Freq: Once | INTRAVENOUS | Status: AC
  Administered 2020-11-02: 1000 mL via INTRAVENOUS

## 2020-11-02 MED ORDER — PANTOPRAZOLE SODIUM 40 MG PO TBEC
40.0000 mg | DELAYED_RELEASE_TABLET | Freq: Every day | ORAL | Status: DC
  Administered 2020-11-03: 40 mg via ORAL
  Filled 2020-11-02: qty 1

## 2020-11-02 MED ORDER — SODIUM CHLORIDE 0.9 % IV SOLN: Freq: Once | INTRAVENOUS | Status: AC

## 2020-11-02 MED ORDER — ONDANSETRON HCL 4 MG/2ML IJ SOLN
4.0000 mg | Freq: Once | INTRAMUSCULAR | Status: AC
  Administered 2020-11-02: 4 mg via INTRAVENOUS
  Filled 2020-11-02: qty 2

## 2020-11-02 MED ORDER — TRAZODONE HCL 50 MG PO TABS: 50.0000 mg | ORAL_TABLET | Freq: Every evening | ORAL | Status: DC | PRN

## 2020-11-02 NOTE — ED Provider Notes (Signed)
[facility] EMERGENCY DEPARTMENT Provider Note   CSN: [id number] Arrival date & time: 11/02/20  1549    History Chief Complaint  Patient presents with   Seizures    Deborah Lucero is a 60 y.o. female with no significant past medical history who presents for evaluation of altered mental status and possible seizure.  Family reported to EMS that patient been confused all day.  This afternoon she had a single witnessed seizures with family. ( EMS reports 2 however daughter states 1) No history of similar.  No known trauma.  When EMS arrived at her house patient was unresponsive and appeared postictal.  Upon ambulance patient began to become more responsive and combative.  Patient awake however sleepy.  Appears postictal.  Lower lip bite however no urinary incontinence, no tongue injury  Patient states the only thing she remembers is not feeling well today and then being in the hospital  Level 5 Caveat AMS  5/24- Xanax Rx filled>> 30 days  Per daughter, patient confused today, did not know the date, who family was.  Drinks 1-2 beers a day.  No history of DTs or withdrawal seizures.  Last drink yesterday was 2 white claws   HPI     Past Medical History:  Diagnosis Date   Abdominal pain 05/05/2013   Anxiety    Anxiety and depression 10/04/2015   Constipation 05/05/2013   Hiatal hernia    Mental disorder    PONV (postoperative nausea and vomiting)    Scoliosis    Watery stools 05/05/2013    Patient Active Problem List   Diagnosis Date Noted   Body mass index 30.0-30.9, adult 08/29/2020   Weight loss counseling, encounter for 08/29/2020   Sleep disturbance 08/01/2020   Class 1 obesity due to excess calories without serious comorbidity with body mass index (BMI) of 34.0 to 34.9 in adult 07/26/2019   Hypercalcemia 06/29/2019   Encounter for gynecological examination with Papanicolaou smear of cervix 09/29/2018   Screening for colorectal cancer 09/29/2018   Chest wall muscle strain  09/29/2018   Superficial fungus infection of skin 09/29/2018   Hot flashes 09/29/2018   Elevated cholesterol 09/29/2018   Elevated BP without diagnosis of hypertension 09/29/2018   Encounter for well woman exam with routine gynecological exam 11/04/2016   Hormone replacement therapy (HRT) 11/04/2016   Anxiety and depression 10/04/2015   GERD (gastroesophageal reflux disease) 12/28/2013   Abdominal pain 05/05/2013   Constipation 05/05/2013   Watery stools 05/05/2013   Anxiety 03/15/2013   Rash 11/15/2012    Past Surgical History:  Procedure Laterality Date   BREAST BIOPSY Right 1986   Benign   BREAST SURGERY     lumpectomy right breast-benign   CESAREAN SECTION  06/24/1999   DIAGNOSTIC LAPAROSCOPY     HYSTEROSCOPY WITH THERMACHOICE  08/14/2011   Procedure: HYSTEROSCOPY WITH THERMACHOICE;  Surgeon: Lazaro Arms, MD;  Location: [facility];  Service: Gynecology;;  Thermachoice Endometrial Ablation Total Therapy Time=49min 44sec     OB History     Gravida  1   Para  1   Term      Preterm      AB      Living  2      SAB      IAB      Ectopic      Multiple  1   Live Births  2           Family History  Problem Relation Age of Onset  Alzheimer's disease Father    Alzheimer's disease Sister     Social History   Tobacco Use   Smoking status: Never   Smokeless tobacco: Never  Vaping Use   Vaping Use: Never used  Substance Use Topics   Alcohol use: Yes    Alcohol/week: 2.0 standard drinks    Types: 2 Glasses of wine per week    Comment: twice weekly   Drug use: No    Home Medications Prior to Admission medications   Medication Sig Start Date End Date Taking? Authorizing Provider  Ibuprofen (ADVIL PO) Take by mouth as needed.    Yes [provider]  NYSTATIN powder APPLY TOPICALLY THREE TIMES DAILY Patient taking differently: Apply 1 application topically 3 (three) times daily. 09/04/20  Yes Cyril MourningGriffin, Jennifer A, NP  pantoprazole (PROTONIX)  40 MG tablet Take 40 mg by mouth daily. 12/16/18  Yes [provider]  pseudoephedrine (SUDAFED) 120 MG 12 hr tablet Take 120 mg by mouth 2 (two) times daily.   Yes [provider]  traZODone (DESYREL) 50 MG tablet Take 1 tablet (50 mg total) by mouth at bedtime as needed for sleep. 08/01/20  Yes Adline PotterGriffin, Jennifer A, NP  ALPRAZolam Prudy Feeler(XANAX) 0.5 MG tablet Take 1 tablet (0.5 mg total) by mouth at bedtime as needed. for anxiety Patient not taking: Reported on 08/29/2020 07/24/20   Adline PotterGriffin, Jennifer A, NP  phentermine (ADIPEX-P) 37.5 MG tablet Take 1 tablet (37.5 mg total) by mouth daily before breakfast. Patient not taking: Reported on 11/02/2020 08/29/20   Adline PotterGriffin, Jennifer A, NP  triamcinolone ointment (KENALOG) 0.5 % Apply 1 application topically 2 (two) times daily. Patient not taking: Reported on 11/02/2020 08/01/20   Adline PotterGriffin, Jennifer A, NP    Allergies    Codeine  Review of Systems   Review of Systems  Unable to perform ROS: Mental status change  All other systems reviewed and are negative.  Physical Exam Updated Vital Signs BP 122/68   Pulse 80   Temp 97.8 F (36.6 C) (Oral)   Resp (!) 23   Ht 5\' 5"  (1.651 m)   Wt 68 kg   SpO2 100%   BMI 24.95 kg/m   Physical Exam Vitals and nursing note reviewed.  Constitutional:      General: She is not in acute distress.    Appearance: She is well-developed. She is not ill-appearing, toxic-appearing or diaphoretic.  HENT:     Head: Normocephalic and atraumatic.     Nose: Nose normal.     Mouth/Throat:     Mouth: Mucous membranes are moist.  Eyes:     Pupils: Pupils are equal, round, and reactive to light.  Cardiovascular:     Rate and Rhythm: Tachycardia present.     Pulses: Normal pulses.     Heart sounds: Normal heart sounds.  Pulmonary:     Effort: Pulmonary effort is normal. No respiratory distress.     Breath sounds: Normal breath sounds.  Abdominal:     General: Bowel sounds are normal. There is no distension.      Palpations: Abdomen is soft.  Musculoskeletal:        General: No swelling, tenderness, deformity or signs of injury. Normal range of motion.     Cervical back: Normal range of motion.     Right lower leg: No edema.     Left lower leg: No edema.  Skin:    General: Skin is warm and dry.     Capillary Refill: Capillary  refill takes less than 2 seconds.  Neurological:     General: No focal deficit present.     Mental Status: She is alert.     Comments: CN 2-12 intact Equal grip No drift Equal strength Intact sensation Alert to person,place however not time  Psychiatric:        Mood and Affect: Mood normal.    ED Results / Procedures / Treatments   Labs (all labs ordered are listed, but only abnormal results are displayed) Labs Reviewed  CBC WITH DIFFERENTIAL/PLATELET - Abnormal; Notable for the following components:      Result Value   Abs Immature Granulocytes 0.12 (*)    All other components within normal limits  COMPREHENSIVE METABOLIC PANEL - Abnormal; Notable for the following components:   Sodium 124 (*)    Chloride 94 (*)    CO2 19 (*)    Glucose, Bld 144 (*)    All other components within normal limits  URINALYSIS, ROUTINE W REFLEX MICROSCOPIC - Abnormal; Notable for the following components:   Hgb urine dipstick TRACE (*)    All other components within normal limits  LACTIC ACID, PLASMA - Abnormal; Notable for the following components:   Lactic Acid, Venous 4.2 (*)    All other components within normal limits  I-STAT BETA HCG BLOOD, ED ([facility], [facility], [facility] ONLY) - Abnormal; Notable for the following components:   I-stat hCG, quantitative 5.1 (*)    All other components within normal limits  CULTURE, BLOOD (ROUTINE X 2)  CULTURE, BLOOD (ROUTINE X 2)  RESP PANEL BY RT-PCR (FLU A&B, COVID) ARPGX2  RAPID URINE DRUG SCREEN, HOSP PERFORMED  PREGNANCY, URINE  ETHANOL  URINALYSIS, MICROSCOPIC (REFLEX)  LACTIC ACID, PLASMA    EKG None  Radiology CT HEAD WO CONTRAST  ( )  Result Date: 11/02/2020 CLINICAL DATA:  Seizure and confusion. EXAM: CT HEAD WITHOUT CONTRAST TECHNIQUE: Contiguous axial images were obtained from the base of the skull through the vertex without intravenous contrast. COMPARISON:  None. FINDINGS: Brain: No evidence of acute infarction, hemorrhage, hydrocephalus, extra-axial collection or mass lesion/mass effect. Vascular: No hyperdense vessel. Atherosclerotic calcifications of the intracranial internal carotid arteries at skull base. Skull: Normal. Negative for fracture or focal lesion. Sinuses/Orbits: Visualized portions of the paranasal sinuses and mastoid air cells are predominantly clear. Orbits are grossly unremarkable. Other: None IMPRESSION: No acute intracranial findings. Electronically Signed   By: Maudry Mayhew M.D.   On: 11/02/2020 17:11   DG Chest Portable 1 View  Result Date: 11/02/2020 CLINICAL DATA:  Altered mental status, confusion and seizures EXAM: PORTABLE CHEST 1 VIEW COMPARISON:  None. FINDINGS: The heart size and mediastinal contours are within normal limits. Low lung volumes with bibasilar subsegmental atelectasis. No pleural effusion. No pneumothorax. The visualized skeletal structures are unremarkable. IMPRESSION: 1. Low lung volumes with bibasilar subsegmental atelectasis. No lobar consolidation or pleural effusion. Electronically Signed   By: Maudry Mayhew M.D.   On: 11/02/2020 17:12    Procedures .Critical Care  Date/Time: 11/02/2020 6:59 PM Performed by: [name], PA-C Authorized by: [name], PA-C   Critical care provider statement:    Critical care time (minutes):  35   Critical care was necessary to treat or prevent imminent or life-threatening deterioration of the following conditions:  Metabolic crisis   Critical care was time spent personally by me on the following activities:  Discussions with consultants, evaluation of patient's response to treatment, examination of patient, ordering  and performing treatments and interventions,  ordering and review of laboratory studies, ordering and review of radiographic studies, pulse oximetry, re-evaluation of patient's condition, obtaining history from patient or surrogate and review of old charts   Medications Ordered in ED Medications  ondansetron (ZOFRAN) injection 4 mg (has no administration in time range)  sodium chloride 0.9 % bolus 1,000 mL (0 mLs Intravenous Stopped 11/02/20 1846)   ED Course  I have reviewed the triage vital signs and the nursing notes.  Pertinent labs & imaging results that were available during my care of the patient were reviewed by me and considered in my medical decision making (see chart for details).  Here for evaluation of altered mental status, possible seizure.  Patient afebrile, nonseptic, not ill-appearing however does appear slow to respond.  Alert to person, place however not time.  She does follow commands and appears to have a nonfocal neuro exam without any obvious deficits in sensation, strength.  She does appear to have a mild lower lip injury however no tongue injury or urinary incontinence.  All patient remembers is not feeling well this morning and then waking up in an ambulance her heart and lungs are clear.  Abdomen soft, nontender.  Denies any urinary symptoms.  Plan on labs, imaging and reassess  Labs and imaging personally reviewed and interpreted:  CT head without significant abnormality Chest x-ray without significant abnormality Lactic acid 4.2, low suspicion for sepsis likely due to seizure UA negative for infection Ethanol level negative UDS negative CBC without significant abnormality Metabolic panel with sodium 124, glucose 144, CO2 19  Patient reassessed.  Still has some mild confusion.  Unclear etiology of patient's seizure, does have history of alcohol use however she does not appear to be actively withdrawing, no tachycardia, hypertension, CIWA is 1 for nausea.  Has a  sodium of 124 however I do not feel that this is likely the cause of her seizure.  Will need to be brought in for further management of this  Discussed with attending, Dr. Jacqulyn Bath who is agreeable with plan and dispo  [facility] to admit    MDM Rules/Calculators/A&P                            Final Clinical Impression(s) / ED Diagnoses Final diagnoses:  Seizure-like activity (HCC)  Hyponatremia    Rx / DC Orders ED Discharge Orders     None        Izola Teague A, PA-C 11/02/20 1901    Maia Plan, MD 11/06/20 1730

## 2020-11-02 NOTE — ED Notes (Signed)
Daughter at bedside, pt repeatedly apologetic and states "I want to go home"

## 2020-11-02 NOTE — ED Triage Notes (Signed)
Pt brought in from home by [facility] with c/o seizure and confusion. Family reported to EMS that pt has been confused all day and then this afternoon she had 2 seizures. No hx of seizures. EMS reported when they arrived at her house she wasn't responsive and appeared post ictal. Once they got pt onto the ambulance, pt started getting combative and was this way the entire way to the hospital. No vitals were able to be obtained from pt due to being combative. CBG 187 for EMS.

## 2020-11-02 NOTE — Progress Notes (Signed)
[facility] night shift telemetry coverage note.  The nursing staff reported that the patient was having a headache.  Acetaminophen 650 mg p.o. every 6 hours as needed ordered.  Sanda Klein, MD.

## 2020-11-02 NOTE — H&P (Signed)
History and Physical  Deborah Lucero BWG:[id number] DOB: September 22, 1960 DOA: 11/02/2020  Referring physician: [name], PA-C PCP: [name], MD  Patient coming from: Home  Chief Complaint: Seizures  HPI: Deborah Lucero is a 60 y.o. female with medical history significant for GERD, anxiety, insomnia who presents to the emergency department via EMS due to altered mental status and reported witnessed seizures which occurred prior to arrival to the ED.  Patient was reported to have been confused all day and she presented with an episode of witnessed seizure at home (per daughter at bedside), EMS was activated, and on arrival of EMS, she was in postictal state and was initially unresponsive, however, she became combative while in the ambulance in route to the hospital.  Patient was reported to drink 1-2 beers daily and she had no history of alcohol withdrawal or DTs.  Patient had 2 white claws yesterday.  There was no report of any new medication.  She complained of nausea, but denies vomiting, chest pain, shortness of breath or abdominal pain.  ED Course:  In the emergency department, she was initially tachycardic and tachypneic on arrival, but this eventually improved to normal range.  Other vital signs were within normal range.  Work-up in the ED showed normal CBC, hyponatremia, hypochloremia, hyperglycemia, urinalysis was unimpressive for UTI, alcohol level was negative, urine drug screen was negative, urinalysis was unimpressive for UTI, lactic acid 4.2 > 1.2.  Influenza A, B, SARS coronavirus 2 was negative. Chest x-ray showed Low lung volumes with bibasilar subsegmental atelectasis. No lobar consolidation or pleural effusion CT head without contrast showed no acute intracranial findings IV Zofran was given, IV hydration was provided.  Review of Systems: Constitutional: Negative for chills and fever.  HENT: Negative for ear pain and sore throat.   Eyes: Negative for pain and  visual disturbance.  Respiratory: Negative for cough, chest tightness and shortness of breath.   Cardiovascular: Negative for chest pain and palpitations.  Gastrointestinal: Positive for nausea.  Negative for abdominal pain and vomiting.  Endocrine: Negative for polyphagia and polyuria.  Genitourinary: Negative for decreased urine volume, dysuria, enuresis Musculoskeletal: Negative for arthralgias and back pain.  Skin: Negative for color change and rash.  Allergic/Immunologic: Negative for immunocompromised state.  Neurological: Positive for seizures.  Negative for tremors, speech difficulty Hematological: Does not bruise/bleed easily.  All other systems reviewed and are negative  Past Medical History:  Diagnosis Date   Abdominal pain 05/05/2013   Anxiety    Anxiety and depression 10/04/2015   Constipation 05/05/2013   Hiatal hernia    Mental disorder    PONV (postoperative nausea and vomiting)    Scoliosis    Watery stools 05/05/2013   Past Surgical History:  Procedure Laterality Date   BREAST BIOPSY Right 1986   Benign   BREAST SURGERY     lumpectomy right breast-benign   CESAREAN SECTION  06/24/1999   DIAGNOSTIC LAPAROSCOPY     HYSTEROSCOPY WITH THERMACHOICE  08/14/2011   Procedure: HYSTEROSCOPY WITH THERMACHOICE;  Surgeon: Lazaro Arms, MD;  Location: [facility];  Service: Gynecology;;  Thermachoice Endometrial Ablation Total Therapy Time=13min 44sec    Social History:  reports that she has never smoked. She has never used smokeless tobacco. She reports current alcohol use of about 2.0 standard drinks per week. She reports that she does not use drugs.   Allergies  Allergen Reactions   Codeine Other (See Comments)    Hallucinations    Family History  Problem Relation Age  of Onset   Alzheimer's disease Father    Alzheimer's disease Sister     Prior to Admission medications   Medication Sig Start Date End Date Taking? Authorizing Provider  Ibuprofen (ADVIL PO) Take by mouth  as needed.    Yes [provider]  NYSTATIN powder APPLY TOPICALLY THREE TIMES DAILY Patient taking differently: Apply 1 application topically 3 (three) times daily. 09/04/20  Yes Cyril Mourning A, NP  pantoprazole (PROTONIX) 40 MG tablet Take 40 mg by mouth daily. 12/16/18  Yes [provider]  pseudoephedrine (SUDAFED) 120 MG 12 hr tablet Take 120 mg by mouth 2 (two) times daily.   Yes [provider]  traZODone (DESYREL) 50 MG tablet Take 1 tablet (50 mg total) by mouth at bedtime as needed for sleep. 08/01/20  Yes Adline Potter, NP  ALPRAZolam Prudy Feeler) 0.5 MG tablet Take 1 tablet (0.5 mg total) by mouth at bedtime as needed. for anxiety Patient not taking: Reported on 08/29/2020 07/24/20   Adline Potter, NP  phentermine (ADIPEX-P) 37.5 MG tablet Take 1 tablet (37.5 mg total) by mouth daily before breakfast. Patient not taking: Reported on 11/02/2020 08/29/20   Adline Potter, NP  triamcinolone ointment (KENALOG) 0.5 % Apply 1 application topically 2 (two) times daily. Patient not taking: Reported on 11/02/2020 08/01/20   Adline Potter, NP    Physical Exam: BP 131/70   Pulse 95   Temp 97.8 F (36.6 C) (Oral)   Resp 16   Ht 5\' 5"  (1.651 m)   Wt 68 kg   SpO2 92%   BMI 24.95 kg/m   General: 60 y.o. year-old female well developed well nourished in no acute distress.  Alert and oriented to person and place only.  HEENT: NCAT, EOMI Neck: Supple, trachea medial Cardiovascular: Tachycardia.  Regular rate and rhythm with no rubs or gallops.  No thyromegaly or JVD noted.  No lower extremity edema. 2/4 pulses in all 4 extremities. Respiratory: Clear to auscultation with no wheezes or rales.  Abdomen: Soft, nontender nondistended with normal bowel sounds x4 quadrants. Muskuloskeletal: No cyanosis, clubbing or edema noted bilaterally Neuro: CN II-XII intact, strength 5/5 x 4, sensation, reflexes intact Skin: No ulcerative lesions noted or  rashes Psychiatry: Mood is appropriate for condition and setting          Labs on Admission:  Basic Metabolic Panel: Recent Labs  Lab 11/02/20 1642  NA 124*  K 3.5  CL 94*  CO2 19*  GLUCOSE 144*  BUN 10  CREATININE 0.89  CALCIUM 8.9   Liver Function Tests: Recent Labs  Lab 11/02/20 1642  AST 33  ALT 32  ALKPHOS 84  BILITOT 0.6  PROT 7.2  ALBUMIN 4.4   No results for input(s): LIPASE, AMYLASE in the last 168 hours. No results for input(s): AMMONIA in the last 168 hours. CBC: Recent Labs  Lab 11/02/20 1642  WBC 9.2  NEUTROABS 7.6  HGB 13.8  HCT 40.3  MCV 90.2  PLT 315   Cardiac Enzymes: No results for input(s): CKTOTAL, CKMB, CKMBINDEX, TROPONINI in the last 168 hours.  BNP (last 3 results) No results for input(s): BNP in the last 8760 hours.  ProBNP (last 3 results) No results for input(s): PROBNP in the last 8760 hours.  CBG: No results for input(s): GLUCAP in the last 168 hours.  Radiological Exams on Admission: CT HEAD WO CONTRAST (01/02/21)  Result Date: 11/02/2020 CLINICAL DATA:  Seizure and confusion. EXAM: CT HEAD WITHOUT CONTRAST  TECHNIQUE: Contiguous axial images were obtained from the base of the skull through the vertex without intravenous contrast. COMPARISON:  None. FINDINGS: Brain: No evidence of acute infarction, hemorrhage, hydrocephalus, extra-axial collection or mass lesion/mass effect. Vascular: No hyperdense vessel. Atherosclerotic calcifications of the intracranial internal carotid arteries at skull base. Skull: Normal. Negative for fracture or focal lesion. Sinuses/Orbits: Visualized portions of the paranasal sinuses and mastoid air cells are predominantly clear. Orbits are grossly unremarkable. Other: None IMPRESSION: No acute intracranial findings. Electronically Signed   By: Maudry Mayhew M.D.   On: 11/02/2020 17:11   DG Chest Portable 1 View  Result Date: 11/02/2020 CLINICAL DATA:  Altered mental status, confusion and seizures EXAM:  PORTABLE CHEST 1 VIEW COMPARISON:  None. FINDINGS: The heart size and mediastinal contours are within normal limits. Low lung volumes with bibasilar subsegmental atelectasis. No pleural effusion. No pneumothorax. The visualized skeletal structures are unremarkable. IMPRESSION: 1. Low lung volumes with bibasilar subsegmental atelectasis. No lobar consolidation or pleural effusion. Electronically Signed   By: Maudry Mayhew M.D.   On: 11/02/2020 17:12    EKG: I independently viewed the EKG done and my findings are as followed: Sinus tachycardia at a rate of 112 bpm  Assessment/Plan Present on Admission:  GERD (gastroesophageal reflux disease)  Anxiety  Principal Problem:   Seizure (HCC) Active Problems:   Anxiety   GERD (gastroesophageal reflux disease)   Altered mental status   Lactic acidosis   Hyponatremia   Hyperglycemia   Insomnia   Alcohol use   Altered mental status in the setting of reported witnessed seizure episode Altered mental status appears to have slightly improved since arrival to the ED, she is alert and oriented to time and place only.  Patient is usually very communicative and able to take care of IADL.  Patient is not yet at baseline per daughter at bedside Patient has not had any seizure episodes since arrival to the ED EEG will be done in the morning Continue fall precaution and neurochecks Consider neurology consult based on EEG findings  Lactic acidosis in the setting of above- resolved Lactic acid 4.2 > 1.2  Hyponatremia possibly secondary to multifactorial including beer potomania Na 124, Continue gentle hydration Continue to monitor sodium with serial BMPs Urine osmolality, serum osmolality and urine sodium will be checked  Hyperglycemia possibly reactive CBG 144, continue to monitor blood glucose levels with AM labs  GERD Continue Protonix  Insomnia Continue Trazodone prn per home regimen  History of anxiety Pt. Used to be on Xanax, consider  re-starting this if clinically indicated  Alcohol use Pt. Counseled on Alcohol use cessation   DVT prophylaxis: Lovenox   Code Status: Full code   Family Communication: Daughter at bedside ( all questions answered to satisfaction)   Disposition Plan:  Patient is from:                        home Anticipated DC to:                   SNF or family members home Anticipated DC date:               2-3 days Anticipated DC barriers:         Patient requires inpatient due to new onset witnessed seizure requiring further work-up    Consults called: None  Admission status: Observation    Frankey Shown MD [facility]  11/02/2020, 8:42 PM

## 2020-11-03 DIAGNOSIS — E872 Acidosis: Secondary | ICD-10-CM

## 2020-11-03 DIAGNOSIS — R569 Unspecified convulsions: Secondary | ICD-10-CM | POA: Diagnosis not present

## 2020-11-03 DIAGNOSIS — R410 Disorientation, unspecified: Secondary | ICD-10-CM | POA: Diagnosis not present

## 2020-11-03 DIAGNOSIS — Z7289 Other problems related to lifestyle: Secondary | ICD-10-CM | POA: Diagnosis not present

## 2020-11-03 DIAGNOSIS — E871 Hypo-osmolality and hyponatremia: Secondary | ICD-10-CM

## 2020-11-03 DIAGNOSIS — K219 Gastro-esophageal reflux disease without esophagitis: Secondary | ICD-10-CM | POA: Diagnosis not present

## 2020-11-03 DIAGNOSIS — R739 Hyperglycemia, unspecified: Secondary | ICD-10-CM

## 2020-11-03 LAB — COMPREHENSIVE METABOLIC PANEL
ALT: 27 U/L (ref 0–44)
AST: 30 U/L (ref 15–41)
Albumin: 3.6 g/dL (ref 3.5–5.0)
Alkaline Phosphatase: 67 U/L (ref 38–126)
Anion gap: 7 (ref 5–15)
BUN: 6 mg/dL (ref 6–20)
CO2: 26 mmol/L (ref 22–32)
Calcium: 9.2 mg/dL (ref 8.9–10.3)
Chloride: 107 mmol/L (ref 98–111)
Creatinine, Ser: 0.83 mg/dL (ref 0.44–1.00)
GFR, Estimated: >60 mL/min (ref >60)
Glucose, Bld: 105 mg/dL — ABNORMAL HIGH (ref 70–99)
Potassium: 4.1 mmol/L (ref 3.5–5.1)
Sodium: 140 mmol/L (ref 135–145)
Total Bilirubin: 0.5 mg/dL (ref 0.3–1.2)
Total Protein: 6.2 g/dL — ABNORMAL LOW (ref 6.5–8.1)

## 2020-11-03 LAB — PROTIME-INR
INR: 1 (ref 0.8–1.2)
Prothrombin Time: 13.6 seconds (ref 11.4–15.2)

## 2020-11-03 LAB — CBC
HCT: 38.7 % (ref 36.0–46.0)
Hemoglobin: 13.2 g/dL (ref 12.0–15.0)
MCH: 31.1 pg (ref 26.0–34.0)
MCHC: 34.1 g/dL (ref 30.0–36.0)
MCV: 91.3 fL (ref 80.0–100.0)
Platelets: 277 10*3/uL (ref 150–400)
RBC: 4.24 MIL/uL (ref 3.87–5.11)
RDW: 12.7 % (ref 11.5–15.5)
WBC: 7.4 10*3/uL (ref 4.0–10.5)
nRBC: 0 % (ref 0.0–0.2)

## 2020-11-03 LAB — OSMOLALITY, URINE: Osmolality, Ur: 86 mOsm/kg — ABNORMAL LOW (ref 300–900)

## 2020-11-03 LAB — MAGNESIUM: Magnesium: 2 mg/dL (ref 1.7–2.4)

## 2020-11-03 LAB — PHOSPHORUS: Phosphorus: 2.9 mg/dL (ref 2.5–4.6)

## 2020-11-03 LAB — APTT: aPTT: 30 seconds (ref 24–36)

## 2020-11-03 LAB — OSMOLALITY: Osmolality: 291 mOsm/kg (ref 275–295)

## 2020-11-03 LAB — HIV ANTIBODY (ROUTINE TESTING W REFLEX): HIV Screen 4th Generation wRfx: NONREACTIVE

## 2020-11-03 NOTE — Discharge Summary (Signed)
Physician Discharge Summary  Deborah Lucero FVC:[id number] DOB: January 22, 1961 DOA: 11/02/2020  PCP: [name], MD  Admit date: 11/02/2020 Discharge date: 11/03/2020  Time spent: 35 minutes  Recommendations for Outpatient Follow-up:  Base metabolic panel to follow close renal function Check A1c on follow-up CBGs trend. Make sure patient has follow-up with neurology service for EEG, MRI and further evaluation/treatment of possible underlying seizure disorder. Continue assisting patient with alcohol cessation.   Discharge Diagnoses:  Principal Problem:   Seizure (HCC) Active Problems:   Anxiety   GERD (gastroesophageal reflux disease)   Altered mental status/acute metabolic encephalopathy plan   Lactic acidosis   Hyponatremia   Hyperglycemia   Insomnia   Alcohol use   Discharge Condition: Stable and improved; patient discharged home with instruction to follow-up with PCP and neurology as an outpatient.  CODE STATUS: Full code.  Diet recommendation: Regular diet.  Filed Weights   11/02/20 1559  Weight: 68 kg    History of present illness:  As per H&P written by Dr. Thomes Dinning on 11/02/20 Deborah Lucero is a 60 y.o. female with medical history significant for GERD, anxiety, insomnia who presents to the emergency department via EMS due to altered mental status and reported witnessed seizures which occurred prior to arrival to the ED.  Patient was reported to have been confused all day and she presented with an episode of witnessed seizure at home (per daughter at bedside), EMS was activated, and on arrival of EMS, she was in postictal state and was initially unresponsive, however, she became combative while in the ambulance in route to the hospital.  Patient was reported to drink 1-2 beers daily and she had no history of alcohol withdrawal or DTs.  Patient had 2 white claws yesterday.  There was no report of any new medication.  She complained of nausea, but denies vomiting, chest  pain, shortness of breath or abdominal pain.   ED Course:  In the emergency department, she was initially tachycardic and tachypneic on arrival, but this eventually improved to normal range.  Other vital signs were within normal range.  Work-up in the ED showed normal CBC, hyponatremia, hypochloremia, hyperglycemia, urinalysis was unimpressive for UTI, alcohol level was negative, urine drug screen was negative, urinalysis was unimpressive for UTI, lactic acid 4.2 > 1.2.  Influenza A, B, SARS coronavirus 2 was negative. Chest x-ray showed Low lung volumes with bibasilar subsegmental atelectasis. No lobar consolidation or pleural effusion CT head without contrast showed no acute intracranial findings IV Zofran was given, IV hydration was provided.  Hospital Course:  1-acute metabolic encephalopathy: Multifactorial -In the setting of postictal event, history of alcohol use and hyponatremia. -No further seizure events appreciated -Patient mentation back to baseline at discharge -Case discussed with neurology service; who recommended no initiation of antiepileptic drugs following 1 event of unprovoked seizure-like activity.  CT scan without any focalization's.  Patient will need outpatient EEG and MRI. -Outpatient follow-up with neurology Dr. Gerilyn Pilgrim has been requested. -Patient advised to stop drinking alcohol and to maintain adequate hydration.  2-lactic acidosis -Most likely associated with seizure event. -On admission 4.2; resolved and within normal limits (1.2) after hydration.  3-hyperglycemia: Without history of diabetes -Most likely reactive in the setting of #1. -Will recommend close monitoring of patient's CBGs and to check A1c.  Patient advised to maintain adequate hydration. -CBG 105 after hydration on the day of admission.  4-gastroesophageal reflux disease -Continue PPI.  5-hx of insomnia and anxiety -continue PRN trazodone  -reports not longer  using xanax and expressing  stable mood.  6-hyponatremia/dehydration -in the setting of beer potomania, dehydration and hyperglycemia -solved after hydration  -repeat BMEt at follow up visit.  7-alcohol abuse -cessation counseling provided  -no withdrawal symptoms seen -multivitamin to provide thiamine and folic acid supplementation.   Procedures: See below for x-ray reports.  Consultations: Neurology (Dr. Amada Jupiter): Recommendation for outpatient EEG and MRI along with follow-up with neurology provided.  Safe to discharge patient home without initiation of antiepileptic drugs.  Continue close vigilance and safety restrictions for the next 3-6 months.  Discharge Exam: Vitals:   11/03/20 0541 11/03/20 1434  BP: 99/65 117/64  Pulse: 66 78  Resp: 18 18  Temp: 98.6 F (37 C) (!) 97.5 F (36.4 C)  SpO2: 99% 99%    General: Afebrile, no chest pain, no nausea, no vomiting, no shortness of breath.  Patient denies headaches, blurred vision, focal weakness, tingling sensation or any further seizure-like event.  Feeling ready to go home.  No withdrawal symptoms seen. Cardiovascular: S1 and S2, no rubs, gallops, JVD. Respiratory: Clear to auscultation bilaterally, no using accessory muscle.  No requiring oxygen supplementation. Abdomen: Soft, nontender, nondistended, positive bowel sounds Extremities: No cyanosis, no clubbing.  Discharge Instructions   Discharge Instructions     Discharge instructions   Complete by: As directed    Keep your self well-hydrated Stop alcohol use Follow-up with neurology in 2-3 weeks. (Dr. Gerilyn Pilgrim) No driving, swimming or operating heavy machinery for the next 3-6 months. Close vigilance in case of recurring events.   Driving Restrictions   Complete by: As directed    No unnatended driving for the next 3-6 months.      Allergies as of 11/03/2020       Reactions   Codeine Other (See Comments)   Hallucinations        Medication List     STOP taking these  medications    ALPRAZolam 0.5 MG tablet Commonly known as: XANAX   phentermine 37.5 MG tablet Commonly known as: ADIPEX-P   triamcinolone ointment 0.5 % Commonly known as: KENALOG       TAKE these medications    ADVIL PO Take by mouth as needed.   nystatin powder Generic drug: nystatin APPLY TOPICALLY THREE TIMES DAILY What changed: See the new instructions.   pantoprazole 40 MG tablet Commonly known as: PROTONIX Take 40 mg by mouth daily.   pseudoephedrine 120 MG 12 hr tablet Commonly known as: SUDAFED Take 120 mg by mouth 2 (two) times daily.   traZODone 50 MG tablet Commonly known as: DESYREL Take 1 tablet (50 mg total) by mouth at bedtime as needed for sleep.       Allergies  Allergen Reactions   Codeine Other (See Comments)    Hallucinations    Follow-up Information     [name], MD. Schedule an appointment as soon as possible for a visit in 10 day(s).   Specialty: Internal Medicine Contact information: [address] [place] [address] [phone]         Beryle Beams, MD. Schedule an appointment as soon as possible for a visit in 2 week(s).   Specialty: Neurology Contact information: [address] [place] [address] [phone]                 The results of significant diagnostics from this hospitalization (including imaging, microbiology, ancillary and laboratory) are listed below for reference.    Significant Diagnostic Studies: CT HEAD WO CONTRAST ( )  Result Date: 11/02/2020 CLINICAL DATA:  Seizure and confusion. EXAM: CT HEAD WITHOUT CONTRAST TECHNIQUE: Contiguous axial images were obtained from the base of the skull through the vertex without intravenous contrast. COMPARISON:  None. FINDINGS: Brain: No evidence of acute infarction, hemorrhage, hydrocephalus, extra-axial collection or mass lesion/mass effect. Vascular: No hyperdense vessel. Atherosclerotic calcifications of the intracranial internal carotid  arteries at skull base. Skull: Normal. Negative for fracture or focal lesion. Sinuses/Orbits: Visualized portions of the paranasal sinuses and mastoid air cells are predominantly clear. Orbits are grossly unremarkable. Other: None IMPRESSION: No acute intracranial findings. Electronically Signed   By: Maudry MayhewJeffrey  Waltz M.D.   On: 11/02/2020 17:11   DG Chest Portable 1 View  Result Date: 11/02/2020 CLINICAL DATA:  Altered mental status, confusion and seizures EXAM: PORTABLE CHEST 1 VIEW COMPARISON:  None. FINDINGS: The heart size and mediastinal contours are within normal limits. Low lung volumes with bibasilar subsegmental atelectasis. No pleural effusion. No pneumothorax. The visualized skeletal structures are unremarkable. IMPRESSION: 1. Low lung volumes with bibasilar subsegmental atelectasis. No lobar consolidation or pleural effusion. Electronically Signed   By: Maudry MayhewJeffrey  Waltz M.D.   On: 11/02/2020 17:12    Microbiology: Recent Results (from the past 240 hour(s))  Blood culture (routine x 2)     Status: None (Preliminary result)   Collection Time: 11/02/20  4:42 PM   Specimen: Right Antecubital; Blood  Result Value Ref Range Status   Specimen Description RIGHT ANTECUBITAL  Final   Special Requests   Final    Blood Culture adequate volume BOTTLES DRAWN AEROBIC AND ANAEROBIC   Culture   Final    NO GROWTH < 24 HOURS Performed at [facility], [address] [place], [place] [address]    Report Status PENDING  Incomplete  Blood culture (routine x 2)     Status: None (Preliminary result)   Collection Time: 11/02/20  4:45 PM   Specimen: BLOOD RIGHT HAND  Result Value Ref Range Status   Specimen Description BLOOD RIGHT HAND  Final   Special Requests   Final    Blood Culture adequate volume BOTTLES DRAWN AEROBIC AND ANAEROBIC   Culture   Final    NO GROWTH < 24 HOURS Performed at [facility], [address] [place], [place] [address]    Report Status PENDING  Incomplete  Resp Panel by  RT-PCR (Flu A&B, Covid) Nasopharyngeal Swab     Status: None   Collection Time: 11/02/20  6:20 PM   Specimen: Nasopharyngeal Swab; Nasopharyngeal(NP) swabs in vial transport medium  Result Value Ref Range Status   SARS Coronavirus 2 by RT PCR NEGATIVE NEGATIVE Final    Comment: (NOTE) SARS-CoV-2 target nucleic acids are NOT DETECTED.  The SARS-CoV-2 RNA is generally detectable in upper respiratory specimens during the acute phase of infection. The lowest concentration of SARS-CoV-2 viral copies this assay can detect is 138 copies/mL. A negative result does not preclude SARS-Cov-2 infection and should not be used as the sole basis for treatment or other patient management decisions. A negative result may occur with  improper specimen collection/handling, submission of specimen other than nasopharyngeal swab, presence of viral mutation(s) within the areas targeted by this assay, and inadequate number of viral copies(<138 copies/mL). A negative result must be combined with clinical observations, patient history, and epidemiological information. The expected result is Negative.  Fact Sheet for Patients:  BloggerCourse.comhttps://www.fda.gov/media/152166/download  Fact Sheet for Healthcare Providers:  SeriousBroker.ithttps://www.fda.gov/media/152162/download  This test is no t yet approved or cleared by the  [place] [employer] and  has been authorized for detection and/or diagnosis of SARS-CoV-2 by FDA under an TEFL teacher (EUA). This EUA will remain  in effect (meaning this test can be used) for the duration of the COVID-19 declaration under Section 564(b)(1) of the Act, 21 U.S.C.section 360bbb-3(b)(1), unless the authorization is terminated  or revoked sooner.       Influenza A by PCR NEGATIVE NEGATIVE Final   Influenza B by PCR NEGATIVE NEGATIVE Final    Comment: (NOTE) The Xpert Xpress SARS-CoV-2/FLU/RSV plus assay is intended as an aid in the diagnosis of influenza from Nasopharyngeal swab  specimens and should not be used as a sole basis for treatment. Nasal washings and aspirates are unacceptable for Xpert Xpress SARS-CoV-2/FLU/RSV testing.  Fact Sheet for Patients: BloggerCourse.com  Fact Sheet for Healthcare Providers: SeriousBroker.it  This test is not yet approved or cleared by the [place] FDA and has been authorized for detection and/or diagnosis of SARS-CoV-2 by FDA under an Emergency Use Authorization (EUA). This EUA will remain in effect (meaning this test can be used) for the duration of the COVID-19 declaration under Section 564(b)(1) of the Act, 21 U.S.C. section 360bbb-3(b)(1), unless the authorization is terminated or revoked.  Performed at [facility], [address] [place], [place] [address]      Labs: Basic Metabolic Panel: Recent Labs  Lab 11/02/20 1642 11/03/20 0535  NA 124* 140  K 3.5 4.1  CL 94* 107  CO2 19* 26  GLUCOSE 144* 105*  BUN 10 6  CREATININE 0.89 0.83  CALCIUM 8.9 9.2  MG  --  2.0  PHOS  --  2.9   Liver Function Tests: Recent Labs  Lab 11/02/20 1642 11/03/20 0535  AST 33 30  ALT 32 27  ALKPHOS 84 67  BILITOT 0.6 0.5  PROT 7.2 6.2*  ALBUMIN 4.4 3.6    CBC: Recent Labs  Lab 11/02/20 1642 11/03/20 0535  WBC 9.2 7.4  NEUTROABS 7.6  --   HGB 13.8 13.2  HCT 40.3 38.7  MCV 90.2 91.3  PLT 315 277    Signed:  Vassie Loll MD.  [facility] 11/03/2020, 4:20 PM

## 2020-11-03 NOTE — Plan of Care (Signed)
  Problem: Education: Goal: Knowledge of General Education information will improve Description: Including pain rating scale, medication(s)/side effects and non-pharmacologic comfort measures Outcome: Not Progressing   Problem: Pain Managment: Goal: General experience of comfort will improve Outcome: Progressing   Problem: Safety: Goal: Ability to remain free from injury will improve Outcome: Progressing

## 2020-11-07 ENCOUNTER — Other Ambulatory Visit (HOSPITAL_COMMUNITY): Payer: Self-pay | Admitting: Neurology

## 2020-11-07 DIAGNOSIS — R569 Unspecified convulsions: Secondary | ICD-10-CM

## 2020-11-07 LAB — CULTURE, BLOOD (ROUTINE X 2)
Culture: NO GROWTH
Culture: NO GROWTH
Special Requests: ADEQUATE
Special Requests: ADEQUATE

## 2020-11-08 ENCOUNTER — Other Ambulatory Visit: Payer: Self-pay | Admitting: Adult Health

## 2020-11-16 ENCOUNTER — Ambulatory Visit (HOSPITAL_COMMUNITY)
Admission: RE | Admit: 2020-11-16 | Discharge: 2020-11-16 | Disposition: A | Discharge status: Home / Self Care | Payer: BC Managed Care – PPO | Source: Ambulatory Visit | Attending: Neurology | Admitting: Neurology

## 2020-11-16 ENCOUNTER — Other Ambulatory Visit: Payer: Self-pay

## 2020-11-16 DIAGNOSIS — R569 Unspecified convulsions: Secondary | ICD-10-CM | POA: Insufficient documentation

## 2020-11-16 MED ORDER — GADOBUTROL 1 MMOL/ML IV SOLN
7.0000 mL | Freq: Once | INTRAVENOUS | Status: AC | PRN
  Administered 2020-11-16: 7 mL via INTRAVENOUS

## 2021-01-20 ENCOUNTER — Ambulatory Visit
Admission: EM | Admit: 2021-01-20 | Discharge: 2021-01-20 | Disposition: A | Discharge status: Home / Self Care | Payer: BC Managed Care – PPO

## 2021-01-20 ENCOUNTER — Other Ambulatory Visit: Payer: Self-pay | Admitting: Family Medicine

## 2021-01-20 ENCOUNTER — Other Ambulatory Visit: Payer: Self-pay

## 2021-01-20 DIAGNOSIS — Z20828 Contact with and (suspected) exposure to other viral communicable diseases: Secondary | ICD-10-CM

## 2021-01-20 DIAGNOSIS — R062 Wheezing: Secondary | ICD-10-CM

## 2021-01-20 DIAGNOSIS — J209 Acute bronchitis, unspecified: Secondary | ICD-10-CM

## 2021-01-20 MED ORDER — DEXAMETHASONE SODIUM PHOSPHATE 10 MG/ML IJ SOLN
10.0000 mg | Freq: Once | INTRAMUSCULAR | Status: AC
  Administered 2021-01-20: 10 mg via INTRAMUSCULAR

## 2021-01-20 MED ORDER — ALBUTEROL SULFATE HFA 108 (90 BASE) MCG/ACT IN AERS
1.0000 | INHALATION_SPRAY | Freq: Four times a day (QID) | RESPIRATORY_TRACT | 0 refills | Status: DC | PRN
Start: 2021-01-20 — End: 2022-01-28

## 2021-01-20 MED ORDER — PREDNISONE 20 MG PO TABS: 40.0000 mg | ORAL_TABLET | Freq: Every day | ORAL | 0 refills | Status: DC

## 2021-01-20 MED ORDER — PROMETHAZINE-DM 6.25-15 MG/5ML PO SYRP: 5.0000 mL | ORAL_SOLUTION | Freq: Four times a day (QID) | ORAL | 0 refills | Status: DC | PRN

## 2021-01-20 NOTE — ED Triage Notes (Signed)
Patient states she has a cough, congestion, runny nose starting on Thursday evening. 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 She states she has taken Robitussin and mucinex. Boyfriend came in yesterday and received an antibiotic and an inhaler.  Denies Fever

## 2021-01-20 NOTE — Telephone Encounter (Signed)
prescribed today 01/20/21 with Roosvelt Maser PA

## 2021-01-20 NOTE — ED Provider Notes (Signed)
[facility]    CSN: [id number] Arrival date & time: 01/20/21  1310      History   Chief Complaint No chief complaint on file.   HPI Deborah Lucero is a 60 y.o. female.   Presenting today with 5-day history of hacking cough, wheezing, chest tightness, congestion, fatigue.  Denies fever, chills, body aches, chest pain, abdominal pain, nausea vomiting or diarrhea.  Several sick contacts recently.  No known past history of pulmonary disease.  Trying Mucinex and Robitussin with mild temporary leaf of symptoms.   Past Medical History:  Diagnosis Date   Abdominal pain 05/05/2013   Anxiety    Anxiety and depression 10/04/2015   Constipation 05/05/2013   Hiatal hernia    Mental disorder    PONV (postoperative nausea and vomiting)    Scoliosis    Watery stools 05/05/2013    Patient Active Problem List   Diagnosis Date Noted   Seizure (HCC) 11/02/2020   Altered mental status 11/02/2020   Lactic acidosis 11/02/2020   Hyponatremia 11/02/2020   Hyperglycemia 11/02/2020   Insomnia 11/02/2020   Alcohol use 11/02/2020   Body mass index 30.0-30.9, adult 08/29/2020   Weight loss counseling, encounter for 08/29/2020   Sleep disturbance 08/01/2020   Class 1 obesity due to excess calories without serious comorbidity with body mass index (BMI) of 34.0 to 34.9 in adult 07/26/2019   Hypercalcemia 06/29/2019   Encounter for gynecological examination with Papanicolaou smear of cervix 09/29/2018   Screening for colorectal cancer 09/29/2018   Chest wall muscle strain 09/29/2018   Superficial fungus infection of skin 09/29/2018   Hot flashes 09/29/2018   Elevated cholesterol 09/29/2018   Elevated BP without diagnosis of hypertension 09/29/2018   Encounter for well woman exam with routine gynecological exam 11/04/2016   Hormone replacement therapy (HRT) 11/04/2016   Anxiety and depression 10/04/2015   GERD (gastroesophageal reflux disease) 12/28/2013   Abdominal pain 05/05/2013    Constipation 05/05/2013   Watery stools 05/05/2013   Anxiety 03/15/2013   Rash 11/15/2012    Past Surgical History:  Procedure Laterality Date   BREAST BIOPSY Right 1986   Benign   BREAST SURGERY     lumpectomy right breast-benign   CESAREAN SECTION  06/24/1999   DIAGNOSTIC LAPAROSCOPY     HYSTEROSCOPY WITH THERMACHOICE  08/14/2011   Procedure: HYSTEROSCOPY WITH THERMACHOICE;  Surgeon: Lazaro Arms, MD;  Location: [facility];  Service: Gynecology;;  Thermachoice Endometrial Ablation Total Therapy Time=12min 44sec    OB History     Gravida  1   Para  1   Term      Preterm      AB      Living  2      SAB      IAB      Ectopic      Multiple  1   Live Births  2            Home Medications    Prior to Admission medications   Medication Sig Start Date End Date Taking? Authorizing Provider  albuterol (VENTOLIN HFA) 108 (90 Base) MCG/ACT inhaler Inhale 1-2 puffs into the lungs every 6 (six) hours as needed for wheezing or shortness of breath. 01/20/21  Yes Particia Nearing, PA-C  predniSONE (DELTASONE) 20 MG tablet Take 2 tablets (40 mg total) by mouth daily with breakfast. 01/20/21  Yes Particia Nearing, PA-C  promethazine-dextromethorphan (PROMETHAZINE-DM) 6.25-15 MG/5ML syrup Take 5 mLs by mouth 4 (four) times  daily as needed. 01/20/21  Yes Particia Nearing, PA-C  Ibuprofen (ADVIL PO) Take by mouth as needed.     [provider]  NYSTATIN powder APPLY TOPICALLY THREE TIMES DAILY Patient taking differently: Apply 1 application topically 3 (three) times daily. 09/04/20   Adline Potter, NP  pantoprazole (PROTONIX) 40 MG tablet Take 40 mg by mouth daily. 12/16/18   [provider]  pseudoephedrine (SUDAFED) 120 MG 12 hr tablet Take 120 mg by mouth 2 (two) times daily.    [provider]  sertraline (ZOLOFT) 25 MG tablet Take 25 mg by mouth once.    [provider]  traZODone (DESYREL) 50 MG tablet Take 1  tablet (50 mg total) by mouth at bedtime as needed for sleep. 08/01/20   Adline Potter, NP    Family History Family History  Problem Relation Age of Onset   Alzheimer's disease Father    Alzheimer's disease Sister     Social History Social History   Tobacco Use   Smoking status: Never   Smokeless tobacco: Never  Vaping Use   Vaping Use: Never used  Substance Use Topics   Alcohol use: Yes    Alcohol/week: 2.0 standard drinks    Types: 2 Glasses of wine per week    Comment: twice weekly   Drug use: No     Allergies   Codeine   Review of Systems Review of Systems Per HPI  Physical Exam Triage Vital Signs ED Triage Vitals  Enc Vitals Group     BP 01/20/21 1506 133/86     Pulse Rate 01/20/21 1506 73     Resp 01/20/21 1506 16     Temp 01/20/21 1506 98.2 F (36.8 C)     Temp Source 01/20/21 1506 Oral     SpO2 01/20/21 1506 99 %     Weight --      Height --      Head Circumference --      Peak Flow --      Pain Score 01/20/21 1502 5     Pain Loc --      Pain Edu? --      Excl. in GC? --    No data found.  Updated Vital Signs BP 133/86 (BP Location: Right Arm)   Pulse 73   Temp 98.2 F (36.8 C) (Oral)   Resp 16   SpO2 99%   Visual Acuity Right Eye Distance:   Left Eye Distance:   Bilateral Distance:    Right Eye Near:   Left Eye Near:    Bilateral Near:     Physical Exam Vitals and nursing note reviewed.  Constitutional:      Appearance: Normal appearance.  HENT:     Head: Atraumatic.     Right Ear: Tympanic membrane and external ear normal.     Left Ear: Tympanic membrane and external ear normal.     Nose: Rhinorrhea present.     Mouth/Throat:     Mouth: Mucous membranes are moist.     Pharynx: Posterior oropharyngeal erythema present.  Eyes:     Extraocular Movements: Extraocular movements intact.     Conjunctiva/sclera: Conjunctivae normal.  Cardiovascular:     Rate and Rhythm: Normal rate and regular rhythm.     Heart sounds:  Normal heart sounds.  Pulmonary:     Effort: Pulmonary effort is normal.     Breath sounds: Wheezing present.     Comments: Mild diffuse wheezes bilaterally  Musculoskeletal:        General: Normal range of motion.     Cervical back: Normal range of motion and neck supple.  Skin:    General: Skin is warm and dry.  Neurological:     Mental Status: She is alert and oriented to person, place, and time.  Psychiatric:        Mood and Affect: Mood normal.        Thought Content: Thought content normal.     UC Treatments / Results  Labs (all labs ordered are listed, but only abnormal results are displayed) Labs Reviewed  COVID-19, FLU A+B NAA    EKG   Radiology No results found.  Procedures Procedures (including critical care time)  Medications Ordered in UC Medications  dexamethasone (DECADRON) injection 10 mg (has no administration in time range)    Initial Impression / Assessment and Plan / UC Course  I have reviewed the triage vital signs and the nursing notes.  Pertinent labs & imaging results that were available during my care of the patient were reviewed by me and considered in my medical decision making (see chart for details).     Viral swab pending, will treat for bronchitis with IM Decadron, prednisone, Phenergan DM, albuterol inhaler.  Discussed over-the-counter supportive medications and home care.  Return for acutely worsening symptoms.  Final Clinical Impressions(s) / UC Diagnoses   Final diagnoses:  Exposure to the flu  Acute bronchitis, unspecified organism  Wheezing   Discharge Instructions   None    ED Prescriptions     Medication Sig Dispense Auth. Provider   predniSONE (DELTASONE) 20 MG tablet Take 2 tablets (40 mg total) by mouth daily with breakfast. 6 tablet Particia Nearing, PA-C   promethazine-dextromethorphan (PROMETHAZINE-DM) 6.25-15 MG/5ML syrup Take 5 mLs by mouth 4 (four) times daily as needed. 100 mL Particia Nearing,  PA-C   albuterol (VENTOLIN HFA) 108 (90 Base) MCG/ACT inhaler Inhale 1-2 puffs into the lungs every 6 (six) hours as needed for wheezing or shortness of breath. 18 g Particia Nearing, [place]      PDMP not reviewed this encounter.   Particia Nearing, [place] 01/20/21 [phone]

## 2021-01-21 LAB — COVID-19, FLU A+B NAA
Influenza A, NAA: NOT DETECTED
Influenza B, NAA: NOT DETECTED
SARS-CoV-2, NAA: NOT DETECTED

## 2021-01-31 ENCOUNTER — Other Ambulatory Visit: Payer: Self-pay | Admitting: Adult Health

## 2021-02-25 ENCOUNTER — Other Ambulatory Visit: Payer: Self-pay | Admitting: Family Medicine

## 2021-04-25 ENCOUNTER — Encounter: Payer: Self-pay | Admitting: Orthopaedic Surgery

## 2021-04-25 ENCOUNTER — Other Ambulatory Visit: Payer: Self-pay

## 2021-04-25 ENCOUNTER — Ambulatory Visit: Payer: BC Managed Care – PPO | Admitting: Orthopaedic Surgery

## 2021-04-25 VITALS — BP 125/82 | HR 81 | Ht 65.0 in | Wt 169.0 lb

## 2021-04-25 DIAGNOSIS — M5442 Lumbago with sciatica, left side: Secondary | ICD-10-CM

## 2021-04-25 DIAGNOSIS — G8929 Other chronic pain: Secondary | ICD-10-CM | POA: Diagnosis not present

## 2021-04-25 MED ORDER — CYCLOBENZAPRINE HCL 10 MG PO TABS: 10.0000 mg | ORAL_TABLET | Freq: Every day | ORAL | 0 refills | Status: DC

## 2021-04-25 MED ORDER — NAPROXEN 500 MG PO TABS: 500.0000 mg | ORAL_TABLET | Freq: Two times a day (BID) | ORAL | 5 refills | Status: DC

## 2021-04-25 NOTE — Progress Notes (Signed)
My back is hurting again.  I saw her in April 2021 for lower back pain with left sided sciatica.  She did better but over the last six to eight weeks it has recurred.  She is taking Advil.  She has seen a chiropractor and had X-rays there.  She is not sleeping well.  She has pain to the left little toe and numbness here.  She has no trauma.  She has no weakness.   Spine/Pelvis examination:  Inspection:  Overall, sacoiliac joint benign and hips nontender; without crepitus or defects.   Thoracic spine inspection: Alignment normal without kyphosis present   Lumbar spine inspection:  Alignment  with normal lumbar lordosis, without scoliosis apparent.   Thoracic spine palpation:  without tenderness of spinal processes   Lumbar spine palpation: without tenderness of lumbar area; without tightness of lumbar muscles    Range of Motion:   Lumbar flexion, forward flexion is normal without pain or tenderness    Lumbar extension is full without pain or tenderness   Left lateral bend is normal without pain or tenderness   Right lateral bend is normal without pain or tenderness   Straight leg raising is normal  Strength & tone: normal   Stability overall normal stability  Encounter Diagnosis  Name Primary?   Chronic left-sided low back pain with left-sided sciatica Yes   I will not get new X-rays today as she had recently at chiropractor.  I have no access to them.  I will begin Naprosyn 500 po bid pc, and also Flexeril.  It helped in the past.  I will see her in two weeks.  If not improved, get X-rays and consider MRI.  Call if any problem.  Precautions discussed.  Electronically Signed Sanjuana Kava, MD 2/23/20239:35 AM

## 2021-05-09 ENCOUNTER — Ambulatory Visit: Payer: BC Managed Care – PPO | Admitting: Orthopaedic Surgery

## 2021-07-15 ENCOUNTER — Other Ambulatory Visit: Payer: Self-pay | Admitting: Adult Health

## 2021-10-12 ENCOUNTER — Encounter (HOSPITAL_COMMUNITY): Payer: Self-pay | Admitting: Emergency Medicine

## 2021-10-12 ENCOUNTER — Inpatient Hospital Stay (HOSPITAL_COMMUNITY)
Admission: EM | Admit: 2021-10-12 | Discharge: 2021-10-18 | DRG: 643 | Disposition: A | Discharge status: Home / Self Care | Payer: BC Managed Care – PPO | Attending: Internal Medicine | Admitting: Internal Medicine

## 2021-10-12 ENCOUNTER — Other Ambulatory Visit: Payer: Self-pay

## 2021-10-12 DIAGNOSIS — E871 Hypo-osmolality and hyponatremia: Principal | ICD-10-CM

## 2021-10-12 DIAGNOSIS — F1010 Alcohol abuse, uncomplicated: Secondary | ICD-10-CM

## 2021-10-12 DIAGNOSIS — G9340 Encephalopathy, unspecified: Secondary | ICD-10-CM | POA: Diagnosis present

## 2021-10-12 DIAGNOSIS — R569 Unspecified convulsions: Secondary | ICD-10-CM

## 2021-10-12 DIAGNOSIS — E860 Dehydration: Secondary | ICD-10-CM | POA: Diagnosis present

## 2021-10-12 DIAGNOSIS — I959 Hypotension, unspecified: Secondary | ICD-10-CM | POA: Diagnosis not present

## 2021-10-12 DIAGNOSIS — R5381 Other malaise: Secondary | ICD-10-CM | POA: Diagnosis present

## 2021-10-12 DIAGNOSIS — D72829 Elevated white blood cell count, unspecified: Secondary | ICD-10-CM | POA: Diagnosis present

## 2021-10-12 DIAGNOSIS — G9341 Metabolic encephalopathy: Secondary | ICD-10-CM | POA: Diagnosis present

## 2021-10-12 DIAGNOSIS — E232 Diabetes insipidus: Principal | ICD-10-CM | POA: Diagnosis present

## 2021-10-12 DIAGNOSIS — D649 Anemia, unspecified: Secondary | ICD-10-CM | POA: Diagnosis present

## 2021-10-12 DIAGNOSIS — R339 Retention of urine, unspecified: Secondary | ICD-10-CM | POA: Diagnosis not present

## 2021-10-12 DIAGNOSIS — Z87891 Personal history of nicotine dependence: Secondary | ICD-10-CM

## 2021-10-12 DIAGNOSIS — F32A Depression, unspecified: Secondary | ICD-10-CM | POA: Diagnosis present

## 2021-10-12 DIAGNOSIS — Z885 Allergy status to narcotic agent status: Secondary | ICD-10-CM

## 2021-10-12 DIAGNOSIS — G4700 Insomnia, unspecified: Secondary | ICD-10-CM | POA: Diagnosis present

## 2021-10-12 DIAGNOSIS — Z79899 Other long term (current) drug therapy: Secondary | ICD-10-CM

## 2021-10-12 DIAGNOSIS — R059 Cough, unspecified: Secondary | ICD-10-CM | POA: Diagnosis not present

## 2021-10-12 DIAGNOSIS — Z7141 Alcohol abuse counseling and surveillance of alcoholic: Secondary | ICD-10-CM

## 2021-10-12 DIAGNOSIS — Z79891 Long term (current) use of opiate analgesic: Secondary | ICD-10-CM

## 2021-10-12 HISTORY — DX: Insomnia, unspecified: G47.00

## 2021-10-12 HISTORY — DX: Depression, unspecified: F32.A

## 2021-10-12 MED ORDER — ONDANSETRON HCL 4 MG/2ML IJ SOLN
4.0000 mg | Freq: Once | INTRAMUSCULAR | Status: AC
  Administered 2021-10-12: 4 mg via INTRAVENOUS
  Filled 2021-10-12: qty 2

## 2021-10-12 MED ORDER — SODIUM CHLORIDE 0.9 % IV SOLN
3000.0000 mg | Freq: Once | INTRAVENOUS | Status: AC
  Administered 2021-10-13: 3000 mg via INTRAVENOUS
  Filled 2021-10-12: qty 30

## 2021-10-12 MED ORDER — SODIUM CHLORIDE 0.9 % IV SOLN: INTRAVENOUS | Status: DC

## 2021-10-12 MED ORDER — LEVETIRACETAM IN NACL 1000 MG/100ML IV SOLN: 1000.0000 mg | Freq: Once | INTRAVENOUS | Status: DC

## 2021-10-12 NOTE — ED Triage Notes (Signed)
Pt bib [facility] for seizure. Hx of 1 seizure approx 1 year ago. Pt had seizure 1 week ago. Pt had another seizure a couple days ago and was seen at hospital in [facility] and placed on medication. Pt had seizure tonight and refused transport to hospital. Pt then had another seizure lasting approx 6 minutes and ems was called again. Pt then had another seizure with ems lasting 3-4 minutes which stopped after 2.5mg  versed.

## 2021-10-12 NOTE — ED Provider Notes (Incomplete)
  Provider Note MRN:  [id number]  Arrival date & time: 10/13/21    ED Course and Medical Decision Making  Assumed care from [facility] at shift change.  See note from prior team for complete details, in brief:  61 yo female Hx seizure Was seen at OSH recently for seizure, had neg CTH per family, was started on keppra; had rpt seizure today PTA, prolonged post-ictal period; sodium was normal per family at that time 11/02/20 was admitted for ?seizure acticity @[facility] , hypoNa 124 at that time  Prior team spoke w/ neuro, load w/ keppra If does not return back to normal neuro recommends admit/eeg/mri  12:30 AM BMP resulted, Na is 111; given AMS/seizure will give hypertonics ; consider beer potomania, home meds reviewed >> rpt BMP following hypertonics Mg and K are also diminished Per family pt drinks 5-7 hard seltzer's qd Send urine lytes, ethanol pending, UDS CIWA ordered CTH neg Spoke with Neuro Dr ; recommend continue AED at prior regimen as this appears to be a provoked seizure Will admit to ICU, spoke w/ Dr Wilford Corner who accepts pt for admit  .Critical Care  Performed by: Kendrick Fries, DO Authorized by: Sloan Leiter, DO   Critical care provider statement:    Critical care time (minutes):  41   Critical care time was exclusive of:  Separately billable procedures and treating other patients   Critical care was necessary to treat or prevent imminent or life-threatening deterioration of the following conditions:  CNS failure or compromise and metabolic crisis   Critical care was time spent personally by me on the following activities:  Development of treatment plan with patient or surrogate, discussions with consultants, evaluation of patient's response to treatment, examination of patient, ordering and review of laboratory studies, ordering and review of radiographic studies, ordering and performing treatments and interventions, pulse oximetry, re-evaluation of patient's condition,  review of old charts and obtaining history from patient or surrogate   I assumed direction of critical care for this patient from another provider in my specialty: yes     Care discussed with: admitting provider     Final Clinical Impressions(s) / ED Diagnoses     ICD-10-CM   1. Hyponatremia  E87.1     2. Seizure (HCC)  R56.9     3. Alcohol abuse  F10.10       ED Discharge Orders     None       Discharge Instructions   None        Sloan Leiter, DO 10/13/21 0104    10/15/21, DO 10/13/21 0113

## 2021-10-13 ENCOUNTER — Emergency Department (HOSPITAL_COMMUNITY): Payer: BC Managed Care – PPO

## 2021-10-13 ENCOUNTER — Encounter (HOSPITAL_COMMUNITY): Payer: Self-pay | Admitting: Pulmonary Disease

## 2021-10-13 ENCOUNTER — Inpatient Hospital Stay (HOSPITAL_COMMUNITY): Payer: BC Managed Care – PPO

## 2021-10-13 DIAGNOSIS — E871 Hypo-osmolality and hyponatremia: Secondary | ICD-10-CM | POA: Diagnosis not present

## 2021-10-13 DIAGNOSIS — R569 Unspecified convulsions: Secondary | ICD-10-CM | POA: Insufficient documentation

## 2021-10-13 DIAGNOSIS — G9340 Encephalopathy, unspecified: Secondary | ICD-10-CM | POA: Insufficient documentation

## 2021-10-13 DIAGNOSIS — F1010 Alcohol abuse, uncomplicated: Secondary | ICD-10-CM | POA: Insufficient documentation

## 2021-10-13 DIAGNOSIS — E876 Hypokalemia: Secondary | ICD-10-CM | POA: Insufficient documentation

## 2021-10-13 DIAGNOSIS — R5381 Other malaise: Secondary | ICD-10-CM | POA: Diagnosis present

## 2021-10-13 DIAGNOSIS — R339 Retention of urine, unspecified: Secondary | ICD-10-CM | POA: Diagnosis not present

## 2021-10-13 DIAGNOSIS — G4700 Insomnia, unspecified: Secondary | ICD-10-CM | POA: Diagnosis present

## 2021-10-13 DIAGNOSIS — E232 Diabetes insipidus: Secondary | ICD-10-CM | POA: Diagnosis present

## 2021-10-13 DIAGNOSIS — Z79891 Long term (current) use of opiate analgesic: Secondary | ICD-10-CM | POA: Diagnosis not present

## 2021-10-13 DIAGNOSIS — Z79899 Other long term (current) drug therapy: Secondary | ICD-10-CM | POA: Diagnosis not present

## 2021-10-13 DIAGNOSIS — Z885 Allergy status to narcotic agent status: Secondary | ICD-10-CM | POA: Diagnosis not present

## 2021-10-13 DIAGNOSIS — Z7141 Alcohol abuse counseling and surveillance of alcoholic: Secondary | ICD-10-CM | POA: Diagnosis not present

## 2021-10-13 DIAGNOSIS — Z87891 Personal history of nicotine dependence: Secondary | ICD-10-CM | POA: Diagnosis not present

## 2021-10-13 DIAGNOSIS — F32A Depression, unspecified: Secondary | ICD-10-CM | POA: Diagnosis present

## 2021-10-13 DIAGNOSIS — I959 Hypotension, unspecified: Secondary | ICD-10-CM | POA: Diagnosis not present

## 2021-10-13 DIAGNOSIS — E860 Dehydration: Secondary | ICD-10-CM | POA: Diagnosis present

## 2021-10-13 DIAGNOSIS — R059 Cough, unspecified: Secondary | ICD-10-CM | POA: Diagnosis not present

## 2021-10-13 DIAGNOSIS — D649 Anemia, unspecified: Secondary | ICD-10-CM | POA: Diagnosis present

## 2021-10-13 DIAGNOSIS — G9341 Metabolic encephalopathy: Secondary | ICD-10-CM | POA: Diagnosis present

## 2021-10-13 DIAGNOSIS — D72829 Elevated white blood cell count, unspecified: Secondary | ICD-10-CM | POA: Diagnosis present

## 2021-10-13 LAB — COMPREHENSIVE METABOLIC PANEL
ALT: 20 U/L (ref 0–44)
ALT: 18 U/L (ref 0–44)
AST: 42 U/L — ABNORMAL HIGH (ref 15–41)
AST: 32 U/L (ref 15–41)
Albumin: 3.6 g/dL (ref 3.5–5.0)
Albumin: 3.3 g/dL — ABNORMAL LOW (ref 3.5–5.0)
Alkaline Phosphatase: 54 U/L (ref 38–126)
Alkaline Phosphatase: 49 U/L (ref 38–126)
Anion gap: 15 (ref 5–15)
Anion gap: 11 (ref 5–15)
BUN: 6 mg/dL (ref 6–20)
BUN: <5 mg/dL — ABNORMAL LOW (ref 6–20)
CO2: 17 mmol/L — ABNORMAL LOW (ref 22–32)
CO2: 22 mmol/L (ref 22–32)
Calcium: 8.5 mg/dL — ABNORMAL LOW (ref 8.9–10.3)
Calcium: 8.1 mg/dL — ABNORMAL LOW (ref 8.9–10.3)
Chloride: 79 mmol/L — ABNORMAL LOW (ref 98–111)
Chloride: 86 mmol/L — ABNORMAL LOW (ref 98–111)
Creatinine, Ser: 0.65 mg/dL (ref 0.44–1.00)
Creatinine, Ser: 0.58 mg/dL (ref 0.44–1.00)
GFR, Estimated: >60 mL/min (ref >60)
GFR, Estimated: >60 mL/min (ref >60)
Glucose, Bld: 131 mg/dL — ABNORMAL HIGH (ref 70–99)
Glucose, Bld: 109 mg/dL — ABNORMAL HIGH (ref 70–99)
Potassium: 2.9 mmol/L — ABNORMAL LOW (ref 3.5–5.1)
Potassium: 2.9 mmol/L — ABNORMAL LOW (ref 3.5–5.1)
Sodium: 111 mmol/L — CL (ref 135–145)
Sodium: 119 mmol/L — CL (ref 135–145)
Total Bilirubin: 0.7 mg/dL (ref 0.3–1.2)
Total Bilirubin: 0.9 mg/dL (ref 0.3–1.2)
Total Protein: 5.4 g/dL — ABNORMAL LOW (ref 6.5–8.1)
Total Protein: 5.3 g/dL — ABNORMAL LOW (ref 6.5–8.1)

## 2021-10-13 LAB — MRSA NEXT GEN BY PCR, NASAL: MRSA by PCR Next Gen: NOT DETECTED

## 2021-10-13 LAB — MAGNESIUM
Magnesium: 1.2 mg/dL — ABNORMAL LOW (ref 1.7–2.4)
Magnesium: 2 mg/dL (ref 1.7–2.4)

## 2021-10-13 LAB — BASIC METABOLIC PANEL
Anion gap: 10 (ref 5–15)
Anion gap: 9 (ref 5–15)
Anion gap: 8 (ref 5–15)
BUN: 5 mg/dL — ABNORMAL LOW (ref 6–20)
BUN: <5 mg/dL — ABNORMAL LOW (ref 6–20)
BUN: <5 mg/dL — ABNORMAL LOW (ref 6–20)
CO2: 21 mmol/L — ABNORMAL LOW (ref 22–32)
CO2: 22 mmol/L (ref 22–32)
CO2: 25 mmol/L (ref 22–32)
Calcium: 7.9 mg/dL — ABNORMAL LOW (ref 8.9–10.3)
Calcium: 8.5 mg/dL — ABNORMAL LOW (ref 8.9–10.3)
Calcium: 8.6 mg/dL — ABNORMAL LOW (ref 8.9–10.3)
Chloride: 85 mmol/L — ABNORMAL LOW (ref 98–111)
Chloride: 93 mmol/L — ABNORMAL LOW (ref 98–111)
Chloride: 93 mmol/L — ABNORMAL LOW (ref 98–111)
Creatinine, Ser: 0.6 mg/dL (ref 0.44–1.00)
Creatinine, Ser: 0.77 mg/dL (ref 0.44–1.00)
Creatinine, Ser: 0.72 mg/dL (ref 0.44–1.00)
GFR, Estimated: >60 mL/min (ref >60)
GFR, Estimated: >60 mL/min (ref >60)
GFR, Estimated: >60 mL/min (ref >60)
Glucose, Bld: 107 mg/dL — ABNORMAL HIGH (ref 70–99)
Glucose, Bld: 127 mg/dL — ABNORMAL HIGH (ref 70–99)
Glucose, Bld: 110 mg/dL — ABNORMAL HIGH (ref 70–99)
Potassium: 3.3 mmol/L — ABNORMAL LOW (ref 3.5–5.1)
Potassium: 3.5 mmol/L (ref 3.5–5.1)
Potassium: 3.4 mmol/L — ABNORMAL LOW (ref 3.5–5.1)
Sodium: 116 mmol/L — CL (ref 135–145)
Sodium: 124 mmol/L — ABNORMAL LOW (ref 135–145)
Sodium: 126 mmol/L — ABNORMAL LOW (ref 135–145)

## 2021-10-13 LAB — URINALYSIS, ROUTINE W REFLEX MICROSCOPIC
Bacteria, UA: NONE SEEN
Bilirubin Urine: NEGATIVE
Glucose, UA: NEGATIVE mg/dL
Ketones, ur: 20 mg/dL — AB
Leukocytes,Ua: NEGATIVE
Nitrite: NEGATIVE
Protein, ur: NEGATIVE mg/dL
Specific Gravity, Urine: 1.006 (ref 1.005–1.030)
pH: 6 (ref 5.0–8.0)

## 2021-10-13 LAB — CBC WITH DIFFERENTIAL/PLATELET
Abs Immature Granulocytes: 0.1 10*3/uL — ABNORMAL HIGH (ref 0.00–0.07)
Basophils Absolute: 0 10*3/uL (ref 0.0–0.1)
Basophils Relative: 0 %
Eosinophils Absolute: 0 10*3/uL (ref 0.0–0.5)
Eosinophils Relative: 0 %
HCT: 34.2 % — ABNORMAL LOW (ref 36.0–46.0)
Hemoglobin: 12.6 g/dL (ref 12.0–15.0)
Immature Granulocytes: 1 %
Lymphocytes Relative: 11 %
Lymphs Abs: 1.6 10*3/uL (ref 0.7–4.0)
MCH: 31.8 pg (ref 26.0–34.0)
MCHC: 36.8 g/dL — ABNORMAL HIGH (ref 30.0–36.0)
MCV: 86.4 fL (ref 80.0–100.0)
Monocytes Absolute: 0.8 10*3/uL (ref 0.1–1.0)
Monocytes Relative: 5 %
Neutro Abs: 12.3 10*3/uL — ABNORMAL HIGH (ref 1.7–7.7)
Neutrophils Relative %: 83 %
Platelets: 322 10*3/uL (ref 150–400)
RBC: 3.96 MIL/uL (ref 3.87–5.11)
RDW: 12.2 % (ref 11.5–15.5)
WBC: 14.8 10*3/uL — ABNORMAL HIGH (ref 4.0–10.5)
nRBC: 0 % (ref 0.0–0.2)

## 2021-10-13 LAB — GLUCOSE, CAPILLARY
Glucose-Capillary: 102 mg/dL — ABNORMAL HIGH (ref 70–99)
Glucose-Capillary: 119 mg/dL — ABNORMAL HIGH (ref 70–99)
Glucose-Capillary: 106 mg/dL — ABNORMAL HIGH (ref 70–99)
Glucose-Capillary: 125 mg/dL — ABNORMAL HIGH (ref 70–99)
Glucose-Capillary: 128 mg/dL — ABNORMAL HIGH (ref 70–99)
Glucose-Capillary: 124 mg/dL — ABNORMAL HIGH (ref 70–99)

## 2021-10-13 LAB — RAPID URINE DRUG SCREEN, HOSP PERFORMED
Amphetamines: NOT DETECTED
Barbiturates: NOT DETECTED
Benzodiazepines: POSITIVE — AB
Cocaine: NOT DETECTED
Opiates: NOT DETECTED
Tetrahydrocannabinol: NOT DETECTED

## 2021-10-13 LAB — CBC
HCT: 32.4 % — ABNORMAL LOW (ref 36.0–46.0)
Hemoglobin: 12.1 g/dL (ref 12.0–15.0)
MCH: 31.8 pg (ref 26.0–34.0)
MCHC: 37.3 g/dL — ABNORMAL HIGH (ref 30.0–36.0)
MCV: 85.3 fL (ref 80.0–100.0)
Platelets: 289 10*3/uL (ref 150–400)
RBC: 3.8 MIL/uL — ABNORMAL LOW (ref 3.87–5.11)
RDW: 12.2 % (ref 11.5–15.5)
WBC: 12.9 10*3/uL — ABNORMAL HIGH (ref 4.0–10.5)
nRBC: 0 % (ref 0.0–0.2)

## 2021-10-13 LAB — OSMOLALITY
Osmolality: 249 mOsm/kg — CL (ref 275–295)
Osmolality: 239 mOsm/kg — CL (ref 275–295)

## 2021-10-13 LAB — BASIC METABOLIC PANEL WITH GFR
Anion gap: 8 (ref 5–15)
BUN: <5 mg/dL — ABNORMAL LOW (ref 6–20)
CO2: 23 mmol/L (ref 22–32)
Calcium: 8.5 mg/dL — ABNORMAL LOW (ref 8.9–10.3)
Chloride: 93 mmol/L — ABNORMAL LOW (ref 98–111)
Creatinine, Ser: 0.56 mg/dL (ref 0.44–1.00)
GFR, Estimated: >60 mL/min
Glucose, Bld: 111 mg/dL — ABNORMAL HIGH (ref 70–99)
Potassium: 3.4 mmol/L — ABNORMAL LOW (ref 3.5–5.1)
Sodium: 124 mmol/L — ABNORMAL LOW (ref 135–145)

## 2021-10-13 LAB — PHOSPHORUS
Phosphorus: 1.8 mg/dL — ABNORMAL LOW (ref 2.5–4.6)
Phosphorus: 1.7 mg/dL — ABNORMAL LOW (ref 2.5–4.6)

## 2021-10-13 LAB — ETHANOL: Alcohol, Ethyl (B): <10 mg/dL (ref <10)

## 2021-10-13 LAB — HIV ANTIBODY (ROUTINE TESTING W REFLEX): HIV Screen 4th Generation wRfx: NONREACTIVE

## 2021-10-13 LAB — CBG MONITORING, ED: Glucose-Capillary: 111 mg/dL — ABNORMAL HIGH (ref 70–99)

## 2021-10-13 LAB — NA AND K (SODIUM & POTASSIUM), RAND UR
Potassium Urine: 20 mmol/L
Sodium, Ur: 31 mmol/L

## 2021-10-13 LAB — OSMOLALITY, URINE: Osmolality, Ur: 239 mOsm/kg — ABNORMAL LOW (ref 300–900)

## 2021-10-13 LAB — TSH: TSH: 1.513 u[IU]/mL (ref 0.350–4.500)

## 2021-10-13 LAB — CK: Total CK: 654 U/L — ABNORMAL HIGH (ref 38–234)

## 2021-10-13 LAB — CORTISOL: Cortisol, Plasma: 20.2 ug/dL

## 2021-10-13 MED ORDER — FOLIC ACID 5 MG/ML IJ SOLN
1.0000 mg | Freq: Once | INTRAMUSCULAR | Status: AC
  Administered 2021-10-13: 1 mg via INTRAVENOUS
  Filled 2021-10-13: qty 0.2

## 2021-10-13 MED ORDER — LEVETIRACETAM 500 MG PO TABS
500.0000 mg | ORAL_TABLET | Freq: Two times a day (BID) | ORAL | Status: DC
  Administered 2021-10-13 – 2021-10-18 (×11): 500 mg via ORAL
  Filled 2021-10-13 (×12): qty 1

## 2021-10-13 MED ORDER — DEXTROSE 5 % IV SOLN
INTRAVENOUS | Status: DC
Start: 2021-10-13 — End: 2021-10-14

## 2021-10-13 MED ORDER — SODIUM CHLORIDE 0.9 % IV SOLN: 1.0000 mg | Freq: Once | INTRAVENOUS | Status: DC

## 2021-10-13 MED ORDER — DOCUSATE SODIUM 100 MG PO CAPS: 100.0000 mg | ORAL_CAPSULE | Freq: Two times a day (BID) | ORAL | Status: DC | PRN

## 2021-10-13 MED ORDER — ACETAMINOPHEN 650 MG RE SUPP: 650.0000 mg | Freq: Four times a day (QID) | RECTAL | Status: DC | PRN

## 2021-10-13 MED ORDER — PHENOBARBITAL 32.4 MG PO TABS: 97.2000 mg | ORAL_TABLET | Freq: Three times a day (TID) | ORAL | Status: DC

## 2021-10-13 MED ORDER — SODIUM CHLORIDE 0.9 % IV BOLUS
500.0000 mL | Freq: Once | INTRAVENOUS | Status: AC
  Administered 2021-10-13: 500 mL via INTRAVENOUS

## 2021-10-13 MED ORDER — DEXTROSE 5 % IV SOLN: INTRAVENOUS | Status: AC

## 2021-10-13 MED ORDER — PNEUMOCOCCAL 20-VAL CONJ VACC 0.5 ML IM SUSY: 0.5000 mL | PREFILLED_SYRINGE | INTRAMUSCULAR | Status: DC | PRN

## 2021-10-13 MED ORDER — PHENOBARBITAL 32.4 MG PO TABS: 64.8000 mg | ORAL_TABLET | Freq: Three times a day (TID) | ORAL | Status: DC

## 2021-10-13 MED ORDER — MAGNESIUM SULFATE 2 GM/50ML IV SOLN
2.0000 g | Freq: Once | INTRAVENOUS | Status: AC
  Administered 2021-10-13: 2 g via INTRAVENOUS
  Filled 2021-10-13: qty 50

## 2021-10-13 MED ORDER — CHLORHEXIDINE GLUCONATE CLOTH 2 % EX PADS
6.0000 | MEDICATED_PAD | Freq: Every day | CUTANEOUS | Status: DC
Start: 2021-10-13 — End: 2021-10-17
  Administered 2021-10-13 – 2021-10-16 (×5): 6 via TOPICAL

## 2021-10-13 MED ORDER — LORAZEPAM 2 MG/ML IJ SOLN: 1.0000 mg | INTRAMUSCULAR | Status: DC | PRN

## 2021-10-13 MED ORDER — FOLIC ACID 1 MG PO TABS
1.0000 mg | ORAL_TABLET | Freq: Every day | ORAL | Status: DC
  Administered 2021-10-13 – 2021-10-18 (×6): 1 mg via ORAL
  Filled 2021-10-13 (×6): qty 1

## 2021-10-13 MED ORDER — K PHOS MONO-SOD PHOS DI & MONO 155-852-130 MG PO TABS
250.0000 mg | ORAL_TABLET | Freq: Once | ORAL | Status: AC
Start: 2021-10-13 — End: 2021-10-13
  Administered 2021-10-13: 250 mg via ORAL
  Filled 2021-10-13: qty 1

## 2021-10-13 MED ORDER — PHENOBARBITAL SODIUM 65 MG/ML IJ SOLN: 32.5000 mg | Freq: Three times a day (TID) | INTRAMUSCULAR | Status: DC

## 2021-10-13 MED ORDER — PHENOBARBITAL 32.4 MG PO TABS: 32.4000 mg | ORAL_TABLET | Freq: Three times a day (TID) | ORAL | Status: DC

## 2021-10-13 MED ORDER — SODIUM CHLORIDE 3 % IV BOLUS
250.0000 mL | Freq: Once | INTRAVENOUS | Status: DC
  Administered 2021-10-13: 250 mL via INTRAVENOUS
  Filled 2021-10-13: qty 500

## 2021-10-13 MED ORDER — POTASSIUM CHLORIDE 10 MEQ/100ML IV SOLN
10.0000 meq | INTRAVENOUS | Status: AC
  Administered 2021-10-13 (×3): 10 meq via INTRAVENOUS
  Filled 2021-10-13 (×4): qty 100

## 2021-10-13 MED ORDER — THIAMINE HCL 100 MG/ML IJ SOLN: 100.0000 mg | Freq: Once | INTRAMUSCULAR | Status: DC

## 2021-10-13 MED ORDER — LORAZEPAM 1 MG PO TABS: 1.0000 mg | ORAL_TABLET | ORAL | Status: DC | PRN

## 2021-10-13 MED ORDER — POTASSIUM PHOSPHATES 15 MMOLE/5ML IV SOLN
15.0000 mmol | Freq: Once | INTRAVENOUS | Status: AC
  Administered 2021-10-13: 15 mmol via INTRAVENOUS
  Filled 2021-10-13: qty 5

## 2021-10-13 MED ORDER — ADULT MULTIVITAMIN W/MINERALS CH
1.0000 | ORAL_TABLET | Freq: Every day | ORAL | Status: DC
Start: 2021-10-13 — End: 2021-10-18
  Administered 2021-10-14 – 2021-10-18 (×5): 1 via ORAL
  Filled 2021-10-13 (×6): qty 1

## 2021-10-13 MED ORDER — LORAZEPAM 2 MG/ML IJ SOLN
1.0000 mg | INTRAMUSCULAR | Status: DC | PRN
Start: 2021-10-13 — End: 2021-10-18

## 2021-10-13 MED ORDER — SODIUM CHLORIDE 3 % IV SOLN
INTRAVENOUS | Status: DC
  Filled 2021-10-13: qty 500

## 2021-10-13 MED ORDER — POLYETHYLENE GLYCOL 3350 17 G PO PACK: 17.0000 g | PACK | Freq: Every day | ORAL | Status: DC | PRN

## 2021-10-13 MED ORDER — POTASSIUM CHLORIDE 10 MEQ/100ML IV SOLN
10.0000 meq | INTRAVENOUS | Status: AC
  Administered 2021-10-13 (×2): 10 meq via INTRAVENOUS
  Filled 2021-10-13 (×2): qty 100

## 2021-10-13 MED ORDER — PHENOBARBITAL SODIUM 130 MG/ML IJ SOLN
97.5000 mg | Freq: Three times a day (TID) | INTRAMUSCULAR | Status: DC
  Administered 2021-10-13 (×3): 97.5 mg via INTRAVENOUS
  Filled 2021-10-13 (×3): qty 1

## 2021-10-13 MED ORDER — ONDANSETRON HCL 4 MG/2ML IJ SOLN
4.0000 mg | Freq: Four times a day (QID) | INTRAMUSCULAR | Status: DC | PRN
  Administered 2021-10-14: 4 mg via INTRAVENOUS
  Filled 2021-10-13: qty 2

## 2021-10-13 MED ORDER — ENOXAPARIN SODIUM 40 MG/0.4ML IJ SOSY
40.0000 mg | PREFILLED_SYRINGE | INTRAMUSCULAR | Status: DC
  Administered 2021-10-13 – 2021-10-18 (×6): 40 mg via SUBCUTANEOUS
  Filled 2021-10-13 (×6): qty 0.4

## 2021-10-13 MED ORDER — THIAMINE HCL 100 MG/ML IJ SOLN
100.0000 mg | Freq: Every day | INTRAMUSCULAR | Status: DC
Start: 2021-10-13 — End: 2021-10-14
  Administered 2021-10-13 – 2021-10-14 (×2): 100 mg via INTRAVENOUS
  Filled 2021-10-13 (×2): qty 2

## 2021-10-13 MED ORDER — PHENOBARBITAL SODIUM 65 MG/ML IJ SOLN: 65.0000 mg | Freq: Three times a day (TID) | INTRAMUSCULAR | Status: DC

## 2021-10-13 MED ORDER — SODIUM CHLORIDE 0.9 % IV SOLN: INTRAVENOUS | Status: DC

## 2021-10-13 MED ORDER — K PHOS MONO-SOD PHOS DI & MONO 155-852-130 MG PO TABS
500.0000 mg | ORAL_TABLET | Freq: Once | ORAL | Status: AC
Start: 2021-10-13 — End: 2021-10-13
  Administered 2021-10-13: 500 mg via ORAL
  Filled 2021-10-13: qty 2

## 2021-10-13 NOTE — Progress Notes (Signed)
NAME:  Deborah Lucero, MRN:  [id number], DOB:  October 26, 1960, LOS: 0 ADMISSION DATE:  10/12/2021, CONSULTATION DATE:  8/13 REFERRING MD:  Dr. Pearline Cables, CHIEF COMPLAINT:  hyponatremia; seizure   History of Present Illness:  Patient is a 61 year old F with no PMH on file presenting to [facility] ED on 8/12 with seizure.  Patient had a seizure on 8/11 at [facility] and was seen in the hospital.  Had head CT which was negative and was started on Keppra twice daily.  Daughter states she drinks about 4 alcoholic seltzers a day. Denies drug use.  Patient had seizure a year ago from hyponatremia. Thought to be related to dehydration. Seen by neurology Dr. Merlene Laughter in [place]. No seizure meds given.  Around 8 PM on 8/12, EMS called due to patient staring off into space.  Around 9 PM patient had a witnessed seizure that lasted about 5 minutes. Given 2 versed. Appear to be tonic clonic with prolonged postictal phase.  Patient brought to [facility] ED.  Patient encephalopathic.  Given Keppra.  Hemodynamically stable.  Placed on 2 L [place].  Afebrile.  CT head with no acute abnormality.  Ethanol WNL.  NA 111, K2.9, mag 1.2.  Patient started on hypertonic saline.  She was repleted.  Neuro consulted. PCCM consulted for ICU admission.  Pertinent  Medical History  ETOH use and prior sz  Significant Hospital Events: Including procedures, antibiotic start and stop dates in addition to other pertinent events   8/13 admitted to [facility] hyponatremia and seizures, Na 111->119 after 500cc NS and  250cc bolus 3%, started on D5 50cc/hr  Interim History / Subjective:  Awake, fatigued and slightly slow to answer questions but oriented and alert  Objective   Blood pressure 106/63, pulse 79, temperature 99.4 F (37.4 C), temperature source Axillary, resp. rate 19, height 5\' 6"  (1.676 m), weight 84.5 kg, SpO2 93 %.        Intake/Output Summary (Last 24 hours) at 10/13/2021 1038 Last data filed at 10/13/2021 0900 Gross per 24 hour   Intake 1319.43 ml  Output 3900 ml  Net -2580.57 ml   Filed Weights   10/12/21 2225 10/13/21 0300  Weight: 79.4 kg 84.5 kg    General:  fatigued appearing F, awake and in no distress HEENT: MM pink/moist, sclera anicteric  Neuro: awake, oriented, slightly lethargic but following commands CV: s1s2 rrr, no m/r/g PULM:  clear bilaterally, dry cough noted, no rhonchi or wheezing GI: soft, non-tender, non-distended Extremities: warm/dry, no edema  Skin: no rashes or lesions   Labs reviewed WBC 12 Na 111->119 K 2.9 Chl 86 Urine osms 239 Urine Na 31 UA +ketones, spec grav 1.006  Resolved Hospital Problem list     Assessment & Plan:     Seizure in the setting of hyponatremia suspect [place] and ETOH abuse Acute encephalopathy drinks 4 alcoholic seltzers/beer a day plus more on the weekends -Ethanol WNL -CT head wnl -Pt denies hx of withdrawal  P: -Admit to ICU with telemetry monitoring -neuro following; EEG without clear seizure activity -continue Keppra -Na rose 56meqs in approx7 hrs, started on D50, repeat BMP pending  -Seizure precautions -UDS positive just for benzos -urine osms 239 and Na 31, +ketones -TSH WNL -continue thiamine, folic acid, and mvi -prn ativan for seizures -ciwa protocol in place -phenobarbital for high risk of ETOH withdrawal  Hypokalemia Hypomagnesemia Hypophosphatemia P: -Repleted K and mag -K phos 250mg  -Trend and replete electrolytes as needed  Leukocytosis:  Likely reactive, has cough this AM P: -check CXR -Trend WBC/fever -UA pending  Depression: per daughter takes trazodone P: -hold trazodone   Best Practice (right click and "Reselect all SmartList Selections" daily)   Diet/type: NPO DVT prophylaxis: LMWH GI prophylaxis: N/A Lines: N/A Foley:  N/A Code Status:  full code Last date of multidisciplinary goals of care discussion [pending, improving.  Pt and daughter updated at the  bedside]  Labs   CBC: Recent Labs  Lab 10/12/21 2240 10/13/21 0541  WBC 14.8* 12.9*  NEUTROABS 12.3*  --   HGB 12.6 12.1  HCT 34.2* 32.4*  MCV 86.4 85.3  PLT 322 289     Basic Metabolic Panel: Recent Labs  Lab 10/12/21 2240 10/13/21 0207 10/13/21 0541  NA 111* 116* 119*  K 2.9* 3.3* 2.9*  CL 79* 85* 86*  CO2 17* 21* 22  GLUCOSE 131* 107* 109*  BUN 6 5* <5*  CREATININE 0.65 0.60 0.58  CALCIUM 8.5* 7.9* 8.1*  MG 1.2*  --   --   PHOS  --  1.8*  --     GFR: Estimated Creatinine Clearance: 84 mL/min (by C-G formula based on SCr of 0.58 mg/dL). Recent Labs  Lab 10/12/21 2240 10/13/21 0541  WBC 14.8* 12.9*     Liver Function Tests: Recent Labs  Lab 10/12/21 2240 10/13/21 0541  AST 42* 32  ALT 20 18  ALKPHOS 54 49  BILITOT 0.7 0.9  PROT 5.4* 5.3*  ALBUMIN 3.6 3.3*    No results for input(s): "LIPASE", "AMYLASE" in the last 168 hours. No results for input(s): "AMMONIA" in the last 168 hours.  ABG No results found for: "PHART", "PCO2ART", "PO2ART", "HCO3", "TCO2", "ACIDBASEDEF", "O2SAT"   Coagulation Profile: No results for input(s): "INR", "PROTIME" in the last 168 hours.  Cardiac Enzymes: Recent Labs  Lab 10/13/21 0207  CKTOTAL 654*    HbA1C: No results found for: "HGBA1C"  CBG: Recent Labs  Lab 10/13/21 0008 10/13/21 0311 10/13/21 0755  GLUCAP 111* 102* 119*     Review of Systems:   Patient is encephalopathic and/or intubated. Therefore history has been obtained from chart review.    Past Medical History:  She,  has no past medical history on file.   Surgical History:  History reviewed. No pertinent surgical history.   Social History:      Family History:  Her family history is not on file.   Allergies Allergies  Allergen Reactions   Hydrocodone Other (See Comments)    Syncope      Home Medications  Prior to Admission medications   Not on File     Critical care time: 35 mins     CRITICAL CARE Performed  by: Darcella Gasman Abria Vannostrand   Total critical care time: 35 minutes  Critical care time was exclusive of separately billable procedures and treating other patients.  Critical care was necessary to treat or prevent imminent or life-threatening deterioration.  Critical care was time spent personally by me on the following activities: development of treatment plan with patient and/or surrogate as well as nursing, discussions with consultants, evaluation of patient's response to treatment, examination of patient, obtaining history from patient or surrogate, ordering and performing treatments and interventions, ordering and review of laboratory studies, ordering and review of radiographic studies, pulse oximetry and re-evaluation of patient's condition.    Darcella Gasman Doriana Mazurkiewicz, PA-C [facility] See Amion for pager If no response to pager , please call [phone] until 7pm  After 7:00 pm call Elink  [phone]

## 2021-10-13 NOTE — H&P (Signed)
NAME:  Deborah Lucero, MRN:  [id number], DOB:  1960-03-20, LOS: 0 ADMISSION DATE:  10/12/2021, CONSULTATION DATE:  8/13 REFERRING MD:  Dr. Wallace Cullens, CHIEF COMPLAINT:  hyponatremia; seizure   History of Present Illness:  Patient is a 61 year old female with no PMH on file presenting to [facility] ED on 8/12 with seizure.  Patient had a seizure on 8/11 at [facility] and was seen in the hospital.  Had head CT which was negative and was started on Keppra twice daily.  Daughter states she drinks about 4 alcoholic seltzers a day. Denies drug use.  Patient had seizure a year ago from hyponatremia. Thought to be related to dehydration. Seen by neurology Dr. Gerilyn Pilgrim in [place]. No seizure meds given.  Around 8 PM on 8/12, EMS called due to patient staring off into space.  Around 9 PM patient had a witnessed seizure that lasted about 5 minutes. Given 2 versed. Appear to be tonic clonic with prolonged postictal phase.  Patient brought to [facility] ED.  Patient encephalopathic.  Given Keppra.  Hemodynamically stable.  Placed on 2 L [place].  Afebrile.  CT head with no acute abnormality.  Ethanol WNL.  NA 111, K2.9, mag 1.2.  Patient started on hypertonic saline.  She was repleted.  Neuro consulted. PCCM consulted for ICU admission.  Pertinent  Medical History  None on file  Significant Hospital Events: Including procedures, antibiotic start and stop dates in addition to other pertinent events   8/13 admitted to [facility] hyponatremia and seizures  Interim History / Subjective:  Encephalopathic Vitals stable On hypertonic saline  Objective   Blood pressure 131/70, pulse 73, temperature 97.7 F (36.5 C), temperature source Temporal, resp. rate (!) 21, height 5\' 6"  (1.676 m), weight 79.4 kg, SpO2 93 %.       No intake or output data in the 24 hours ending 10/13/21 0104 Filed Weights   10/12/21 2225  Weight: 79.4 kg    Examination: General:  ill appearing female HEENT: MM pink/moist; [place] in place Neuro: confused;  able to state daughters name and follow some commands but quickly falls asleep; PERRL CV: s1s2, no m/r/g PULM:  dim clear BS bilaterally; [place] 2L GI: soft, bsx4 active  Extremities: warm/dry, no edema  Skin: no rashes or lesions appreciated  Resolved Hospital Problem list     Assessment & Plan:  Seizure Hyponatremia Acute encephalopathy Hx of alcohol use: drinks 4 seltzers a day per daughter -Ethanol WNL -CT head wnl P: -Admit to ICU with telemetry monitoring -neuro following; appreciate recs -EEG -Continue hypertonic saline -Frequent NA checks; avoid NA increase greater than 10 in 24 hours -AED per neuro -Seizure precautions -UDS pending -Follow-up urine osmolality and serum osmolality; check urine sodium -check tsh and cortisol -continue thiamine, folic acid, and mvi -prn ativan for seizures -ciwa protocol in place  Hypokalemia Hypomagnesemia P: -Repleted K and mag -Repeat BMP later this a.m. -Trend and replete electrolytes as needed  Leukocytosis: Likely reactive P: -Trend WBC/fever -UA pending  Depression: per daughter takes trazodone P: -hold trazodone  Unknown PMH and home meds; awaiting charts to come in to care everywhere  Best Practice (right click and "Reselect all SmartList Selections" daily)   Diet/type: NPO DVT prophylaxis: LMWH GI prophylaxis: N/A Lines: N/A Foley:  N/A Code Status:  full code Last date of multidisciplinary goals of care discussion [pending]  Labs   CBC: Recent Labs  Lab 10/12/21 2240  WBC 14.8*  NEUTROABS 12.3*  HGB 12.6  HCT 34.2*  MCV 86.4  PLT 322    Basic Metabolic Panel: Recent Labs  Lab 10/12/21 2240  NA 111*  K 2.9*  CL 79*  CO2 17*  GLUCOSE 131*  BUN 6  CREATININE 0.65  CALCIUM 8.5*  MG 1.2*   GFR: Estimated Creatinine Clearance: 81.4 mL/min (by C-G formula based on SCr of 0.65 mg/dL). Recent Labs  Lab 10/12/21 2240  WBC 14.8*    Liver Function Tests: Recent Labs  Lab 10/12/21 2240   AST 42*  ALT 20  ALKPHOS 54  BILITOT 0.7  PROT 5.4*  ALBUMIN 3.6   No results for input(s): "LIPASE", "AMYLASE" in the last 168 hours. No results for input(s): "AMMONIA" in the last 168 hours.  ABG No results found for: "PHART", "PCO2ART", "PO2ART", "HCO3", "TCO2", "ACIDBASEDEF", "O2SAT"   Coagulation Profile: No results for input(s): "INR", "PROTIME" in the last 168 hours.  Cardiac Enzymes: No results for input(s): "CKTOTAL", "CKMB", "CKMBINDEX", "TROPONINI" in the last 168 hours.  HbA1C: No results found for: "HGBA1C"  CBG: Recent Labs  Lab 10/13/21 0008  GLUCAP 111*    Review of Systems:   Patient is encephalopathic and/or intubated. Therefore history has been obtained from chart review.    Past Medical History:  She,  has no past medical history on file.   Surgical History:  History reviewed. No pertinent surgical history.   Social History:      Family History:  Her family history is not on file.   Allergies Allergies  Allergen Reactions   Hydrocodone Other (See Comments)    syncope     Home Medications  Prior to Admission medications   Not on File     Critical care time: 45 minutes    JD Anselm Lis [facility] 10/13/2021, 1:04 AM  Please see [email or web address] for pager details.  From 7A-7P if no response, please call [phone]. After hours, please call ELink [phone].

## 2021-10-13 NOTE — Progress Notes (Addendum)
eLink Physician-Brief Progress Note Patient Name: Deborah Lucero DOB: Mar 07, 1960 MRN: [id number]   Date of Service  10/13/2021  HPI/Events of Note  61 yr old female admitted to ICU for Asymptomatic hyponatremia, normovolemic from alcoholism with seizures. Got phenobarbital, s/p 250 ml 3% saline Encephalopathy , post itcal improving. Able to protect airways. CTH neg.  Electrolyte imbalance, getting replaced  Data reviewed. TSH.   eICU Interventions  - CIWA protocol - asp, sz and fall precautions - follow serial sodium level is at 116, will hold of on further 3% saline for now.  - do not over correct over 6 meq in first 24 hrs. - EEG. Keppra - kphos 15 mmols ordered. Folow labs       Intervention Category Major Interventions: Seizures - evaluation and management;Electrolyte abnormality - evaluation and management Evaluation Type: New Patient Evaluation  Ranee Gosselin 10/13/2021, 3:01 AM  6:27 Urinary retension x 2. S/p I and O once  Foley cath ordered  Kcl 10 meq q1 hr x 4 and dextrose at 50 ml/hr x 4 hrs ordered for low K and sodium rapid correction to 119 ( baseline from ED 111). Follow sodium. Discussed with bed side RN.

## 2021-10-13 NOTE — Procedures (Signed)
History: 61 yo F being evaluated after seizure  Sedation: None  Technique: This EEG was acquired with electrodes placed according to the International 10-20 electrode system (including Fp1, Fp2, F3, F4, C3, C4, P3, P4, O1, O2, T3, T4, T5, T6, A1, A2, Fz, Cz, Pz). The following electrodes were missing or displaced: none.   Background: The majority of this EEG was recorded during sleep with bilaterally symmetric appearing sleep structures.  During a brief period of arousal, there is a posterior dominant rhythm of 8 to 9 Hz, as well as generalized irregular slow activities admixed in the background.  Photic stimulation: Physiologic driving is not performed  EEG Abnormalities: 1) generalized irregular slow activity  Clinical Interpretation: This EEG is consistent with a mild generalized nonspecific cerebral dysfunction (encephalopathy). There was no seizure or seizure predisposition recorded on this study. Please note that lack of epileptiform activity on EEG does not preclude the possibility of epilepsy.   Ritta Slot, MD [facility] [phone]  If 7pm- 7am, please page neurology on call as listed in AMION.

## 2021-10-13 NOTE — ED Provider Notes (Signed)
[facility] EMERGENCY DEPARTMENT Provider Note   CSN: [id number] Arrival date & time: 10/12/21  2209     History  Chief Complaint  Patient presents with   Seizures    Deborah Lucero is a 61 y.o. female.  Patient brought in for seizure.  Apparently at around 9 PM had a seizure that lasted about 5 minutes stated to be tonic-clonic in nature.  Patient's had prolonged postictal phase since.  EMS was out earlier around 8 PM apparently she was kind of staring off into space the evaluator thought everything was okay and then at 9 PM the seizure occurred.  Patient had a seizure yesterday at [facility] was seen at the hospital there supposedly had a head CT started on Keppra orally which she has been taking 500 mg twice a day.  Apparently she got IV Keppra there as well patient had some kind of history of possible seizure about a year ago or maybe longer seen by Dr. Gerilyn Pilgrim up in [place] never started on antiseizure medicine.  Past medical history noncontributory.  Patient denies any alcohol or drug use.  Patient difficult to around his not really thinking clearly.       Home Medications Prior to Admission medications   Not on File      Allergies    Hydrocodone    Review of Systems   Review of Systems  All other systems reviewed and are negative.   Physical Exam Updated Vital Signs BP 131/70   Pulse 73   Temp 97.7 F (36.5 C) (Temporal)   Resp (!) 21   Ht 1.676 m (5\' 6" )   Wt 79.4 kg   SpO2 93%   BMI 28.25 kg/m  Physical Exam  ED Results / Procedures / Treatments   Labs (all labs ordered are listed, but only abnormal results are displayed) Labs Reviewed  CBC WITH DIFFERENTIAL/PLATELET - Abnormal; Notable for the following components:      Result Value   WBC 14.8 (*)    HCT 34.2 (*)    MCHC 36.8 (*)    Neutro Abs 12.3 (*)    Abs Immature Granulocytes 0.10 (*)    All other components within normal limits  COMPREHENSIVE METABOLIC PANEL -  Abnormal; Notable for the following components:   Sodium 111 (*)    Potassium 2.9 (*)    Chloride 79 (*)    CO2 17 (*)    Glucose, Bld 131 (*)    Calcium 8.5 (*)    Total Protein 5.4 (*)    AST 42 (*)    All other components within normal limits  MAGNESIUM - Abnormal; Notable for the following components:   Magnesium 1.2 (*)    All other components within normal limits  CBG MONITORING, ED - Abnormal; Notable for the following components:   Glucose-Capillary 111 (*)    All other components within normal limits  URINALYSIS, ROUTINE W REFLEX MICROSCOPIC  RAPID URINE DRUG SCREEN, HOSP PERFORMED  ETHANOL  NA AND K (SODIUM & POTASSIUM), RAND UR  OSMOLALITY  OSMOLALITY, URINE  UREA NITROGEN, URINE  URIC ACID, RANDOM URINE  CK  PHOSPHORUS    EKG None  Radiology No results found.  Procedures Procedures    Medications Ordered in ED Medications  0.9 %  sodium chloride infusion ( Intravenous New Bag/Given 10/13/21 0010)  sodium chloride 0.9 % bolus 500 mL (has no administration in time range)  0.9 %  sodium chloride infusion (has no administration in time  range)  potassium chloride 10 mEq in 100 mL IVPB (has no administration in time range)  magnesium sulfate IVPB 2 g 50 mL (has no administration in time range)  sodium chloride 3% (hypertonic) IV bolus 250 mL (has no administration in time range)  ondansetron (ZOFRAN) injection 4 mg (4 mg Intravenous Given 10/12/21 2245)  levETIRAcetam (KEPPRA) 3,000 mg in sodium chloride 0.9 % 250 mL IVPB (3,000 mg Intravenous New Bag/Given 10/13/21 0010)    ED Course/ Medical Decision Making/ A&P                           Medical Decision Making Amount and/or Complexity of Data Reviewed Labs: ordered. Radiology: ordered.  Risk Prescription drug management. Decision regarding hospitalization.     Patient with recurrent seizure.  Discussed with Dr. Sheryn Bison  We will repeat her head CT we will get electrolytes.  He recommended giving her  3 g of Keppra although she is taking Keppra.  Patient is electrolytes are back and head turn patient over to Dr. Wallace Cullens.  Glucose was good at 111.  CBC with leukocytosis 14 probably secondary to seizure.  Hemoglobin good.  Complete metabolic panel however though has significant abnormalities with a sodium of 111 potassium 2.9 CO2 17 renal function normal liver function test normal magnesium is also low at 1.2.  This is explaining the seizures and explaining her altered mental status.  Patient will require admission.  He is got a call critical care.  Originally had ordered normal saline he will discuss hypertonic saline with them.  I also ordered IV magnesium and 10 mEq of potassium x2 IV  CRITICAL CARE Performed by: Vanetta Mulders Total critical care time: 45 minutes Critical care time was exclusive of separately billable procedures and treating other patients. Critical care was necessary to treat or prevent imminent or life-threatening deterioration. Critical care was time spent personally by me on the following activities: development of treatment plan with patient and/or surrogate as well as nursing, discussions with consultants, evaluation of patient's response to treatment, examination of patient, obtaining history from patient or surrogate, ordering and performing treatments and interventions, ordering and review of laboratory studies, ordering and review of radiographic studies, pulse oximetry and re-evaluation of patient's condition.  Final Clinical Impression(s) / ED Diagnoses Final diagnoses:  Hyponatremia  Seizure [facility])    Rx / DC Orders ED Discharge Orders     None         Vanetta Mulders, MD 10/13/21 [phone]

## 2021-10-13 NOTE — ED Notes (Signed)
Dr. Wallace Cullens notified of critical Na of 111.

## 2021-10-13 NOTE — Progress Notes (Signed)
Alcoholics Anonymous for [facility] printed and given to daughter. Daughter states patient drinks 4 seltzer/white claws daily. Education provided to patient, daughter and son regarding low sodium, seizures, ETOH, and excessive water intake. Oliver Barre, RN

## 2021-10-13 NOTE — Progress Notes (Signed)
Na over-corrected to , will increase D5W to 125cc/hr, continue q4hr BMP's.   Darcella Gasman Elvenia Godden, PA-C

## 2021-10-13 NOTE — Progress Notes (Signed)
eLink Physician-Brief Progress Note Patient Name: Deborah Lucero DOB: 1960/12/04 MRN: [id number]   Date of Service  10/13/2021  HPI/Events of Note  K 3.4 received Kphos tab Has repeat labs at 10 pm, request to repeat Mg and phos  eICU Interventions  Ordered to repeat phos. Has Mg ordered for the morning Na 126 and was started on D5W at 125 cc/hr, will await repeat Na level and see if D5W needs to be increased     Intervention Category Intermediate Interventions: Electrolyte abnormality - evaluation and management  Darl Pikes 10/13/2021, 10:22 PM

## 2021-10-13 NOTE — Progress Notes (Signed)
EEG complete - results pending 

## 2021-10-14 DIAGNOSIS — F1010 Alcohol abuse, uncomplicated: Secondary | ICD-10-CM | POA: Diagnosis not present

## 2021-10-14 DIAGNOSIS — E871 Hypo-osmolality and hyponatremia: Secondary | ICD-10-CM | POA: Diagnosis not present

## 2021-10-14 DIAGNOSIS — G9341 Metabolic encephalopathy: Secondary | ICD-10-CM | POA: Diagnosis not present

## 2021-10-14 DIAGNOSIS — R569 Unspecified convulsions: Secondary | ICD-10-CM

## 2021-10-14 LAB — CBC
HCT: 32.3 % — ABNORMAL LOW (ref 36.0–46.0)
Hemoglobin: 11.8 g/dL — ABNORMAL LOW (ref 12.0–15.0)
MCH: 31.7 pg (ref 26.0–34.0)
MCHC: 36.5 g/dL — ABNORMAL HIGH (ref 30.0–36.0)
MCV: 86.8 fL (ref 80.0–100.0)
Platelets: 265 10*3/uL (ref 150–400)
RBC: 3.72 MIL/uL — ABNORMAL LOW (ref 3.87–5.11)
RDW: 12.7 % (ref 11.5–15.5)
WBC: 8.3 10*3/uL (ref 4.0–10.5)
nRBC: 0 % (ref 0.0–0.2)

## 2021-10-14 LAB — BASIC METABOLIC PANEL
Anion gap: 6 (ref 5–15)
Anion gap: 9 (ref 5–15)
BUN: <5 mg/dL — ABNORMAL LOW (ref 6–20)
BUN: <5 mg/dL — ABNORMAL LOW (ref 6–20)
CO2: 24 mmol/L (ref 22–32)
CO2: 27 mmol/L (ref 22–32)
Calcium: 8.6 mg/dL — ABNORMAL LOW (ref 8.9–10.3)
Calcium: 8.6 mg/dL — ABNORMAL LOW (ref 8.9–10.3)
Chloride: 98 mmol/L (ref 98–111)
Chloride: 99 mmol/L (ref 98–111)
Creatinine, Ser: 0.71 mg/dL (ref 0.44–1.00)
Creatinine, Ser: 0.73 mg/dL (ref 0.44–1.00)
GFR, Estimated: >60 mL/min (ref >60)
GFR, Estimated: >60 mL/min (ref >60)
Glucose, Bld: 112 mg/dL — ABNORMAL HIGH (ref 70–99)
Glucose, Bld: 129 mg/dL — ABNORMAL HIGH (ref 70–99)
Potassium: 3 mmol/L — ABNORMAL LOW (ref 3.5–5.1)
Potassium: 3.7 mmol/L (ref 3.5–5.1)
Sodium: 131 mmol/L — ABNORMAL LOW (ref 135–145)
Sodium: 132 mmol/L — ABNORMAL LOW (ref 135–145)

## 2021-10-14 LAB — OSMOLALITY, URINE
Osmolality, Ur: 62 mOsm/kg — ABNORMAL LOW (ref 300–900)
Osmolality, Ur: 403 mOsm/kg (ref 300–900)
Osmolality, Ur: 433 mOsm/kg (ref 300–900)

## 2021-10-14 LAB — GLUCOSE, CAPILLARY
Glucose-Capillary: 108 mg/dL — ABNORMAL HIGH (ref 70–99)
Glucose-Capillary: 124 mg/dL — ABNORMAL HIGH (ref 70–99)
Glucose-Capillary: 131 mg/dL — ABNORMAL HIGH (ref 70–99)
Glucose-Capillary: 141 mg/dL — ABNORMAL HIGH (ref 70–99)

## 2021-10-14 LAB — SODIUM
Sodium: 130 mmol/L — ABNORMAL LOW (ref 135–145)
Sodium: 126 mmol/L — ABNORMAL LOW (ref 135–145)
Sodium: 124 mmol/L — ABNORMAL LOW (ref 135–145)

## 2021-10-14 LAB — PHOSPHORUS: Phosphorus: 2.3 mg/dL — ABNORMAL LOW (ref 2.5–4.6)

## 2021-10-14 LAB — URIC ACID, RANDOM URINE: Uric Acid, Urine: 17.1 mg/dL

## 2021-10-14 LAB — OSMOLALITY: Osmolality: 269 mOsm/kg — ABNORMAL LOW (ref 275–295)

## 2021-10-14 LAB — MAGNESIUM: Magnesium: 2 mg/dL (ref 1.7–2.4)

## 2021-10-14 MED ORDER — DESMOPRESSIN ACETATE 4 MCG/ML IJ SOLN
4.0000 ug | Freq: Once | INTRAMUSCULAR | Status: AC
  Administered 2021-10-14: 4 ug via INTRAVENOUS
  Filled 2021-10-14: qty 1

## 2021-10-14 MED ORDER — PHENOBARBITAL 32.4 MG PO TABS
64.8000 mg | ORAL_TABLET | Freq: Three times a day (TID) | ORAL | Status: AC
  Administered 2021-10-14 – 2021-10-16 (×6): 64.8 mg via ORAL
  Filled 2021-10-14 (×6): qty 2

## 2021-10-14 MED ORDER — DEXTROSE 5 % IV SOLN: INTRAVENOUS | Status: AC

## 2021-10-14 MED ORDER — POTASSIUM CHLORIDE 10 MEQ/100ML IV SOLN
10.0000 meq | INTRAVENOUS | Status: AC
  Administered 2021-10-14 (×4): 10 meq via INTRAVENOUS
  Filled 2021-10-14 (×4): qty 100

## 2021-10-14 MED ORDER — DESMOPRESSIN ACETATE 4 MCG/ML IJ SOLN
4.0000 ug | Freq: Once | INTRAMUSCULAR | Status: AC
Start: 2021-10-14 — End: 2021-10-14
  Administered 2021-10-14: 4 ug via INTRAVENOUS
  Filled 2021-10-14: qty 1

## 2021-10-14 MED ORDER — PHENOBARBITAL 32.4 MG PO TABS
32.4000 mg | ORAL_TABLET | Freq: Three times a day (TID) | ORAL | Status: DC
  Administered 2021-10-16 – 2021-10-17 (×3): 32.4 mg via ORAL
  Filled 2021-10-14 (×3): qty 1

## 2021-10-14 MED ORDER — DEXTROSE 5 % IV SOLN
INTRAVENOUS | Status: DC
Start: 2021-10-14 — End: 2021-10-15

## 2021-10-14 MED ORDER — LIP MEDEX EX OINT: TOPICAL_OINTMENT | CUTANEOUS | Status: DC | PRN

## 2021-10-14 MED ORDER — POTASSIUM PHOSPHATES 15 MMOLE/5ML IV SOLN
15.0000 mmol | Freq: Once | INTRAVENOUS | Status: AC
  Administered 2021-10-14: 15 mmol via INTRAVENOUS
  Filled 2021-10-14: qty 5

## 2021-10-14 MED ORDER — THIAMINE MONONITRATE 100 MG PO TABS
100.0000 mg | ORAL_TABLET | Freq: Every day | ORAL | Status: DC
  Administered 2021-10-15 – 2021-10-18 (×4): 100 mg via ORAL
  Filled 2021-10-14 (×4): qty 1

## 2021-10-14 NOTE — Progress Notes (Signed)
NAME:  Deborah Lucero, MRN:  [id number], DOB:  05-18-1960, LOS: 1 ADMISSION DATE:  10/12/2021, CONSULTATION DATE:  8/13 REFERRING MD:  Dr. Wallace Cullens, CHIEF COMPLAINT:  hyponatremia; seizure   History of Present Illness:  Patient is a 61 year old F with no PMH on file presenting to [facility] ED on 8/12 with seizure.  Patient had a seizure on 8/11 at [facility] and was seen in the hospital.  Had head CT which was negative and was started on Keppra twice daily.  Daughter states she drinks about 4 alcoholic seltzers a day. Denies drug use.  Patient had seizure a year ago from hyponatremia. Thought to be related to dehydration. Seen by neurology Dr. Gerilyn Pilgrim in [place]. No seizure meds given.  Around 8 PM on 8/12, EMS called due to patient staring off into space.  Around 9 PM patient had a witnessed seizure that lasted about 5 minutes. Given 2 versed. Appear to be tonic clonic with prolonged postictal phase.  Patient brought to [facility] ED.  Patient encephalopathic.  Given Keppra.  Hemodynamically stable.  Placed on 2 L [place].  Afebrile.  CT head with no acute abnormality.  Ethanol WNL.  NA 111, K2.9, mag 1.2.  Patient started on hypertonic saline.  She was repleted.  Neuro consulted. PCCM consulted for ICU admission.  Pertinent  Medical History  ETOH use and prior sz  Significant Hospital Events: Including procedures, antibiotic start and stop dates in addition to other pertinent events   8/13 admitted to [facility] hyponatremia and seizures, Na 111->119 after 500cc NS and  250cc bolus 3%, started on D5 50cc/hr  Interim History / Subjective:  Drowsy. No complaints. No events overnight. Asking if she can go home.  Objective   Blood pressure (!) 87/52, pulse 68, temperature 99.3 F (37.4 C), temperature source Oral, resp. rate 16, height 5\' 6"  (1.676 m), weight 80.2 kg, SpO2 93 %.        Intake/Output Summary (Last 24 hours) at 10/14/2021 0855 Last data filed at 10/14/2021 10/16/2021 Gross per 24 hour  Intake  3698.34 ml  Output 6540 ml  Net -2841.66 ml   Filed Weights   10/12/21 2225 10/13/21 0300 10/14/21 0600  Weight: 79.4 kg 84.5 kg 80.2 kg   General:  drowsy/somnolent,  HEENT: MM pink/moist, sclera anicteric  Neuro: somnolent but following commands, no focal deficits CV: s1s2 rrr, no m/r/g PULM:  clear bilaterally, dry cough noted, no rhonchi or wheezing GI: soft, non-tender, non-distended Extremities: warm/dry, no edema  Skin: no rashes or lesions  Resolved Hospital Problem list     Assessment & Plan:   Seizure Acute encephalopathy In the setting of hyponatremia, suspect related [place] and ETOH abuse. Drinks 4 alcoholic seltzers/beer a day plus more on the weekends. Ethanol WNL. CT head wnl. Pt denies hx of withdrawal .  -neuro following; EEG without clear seizure activity -Seizure precautions -ciwa protocol in place -phenobarbital for high risk of ETOH withdrawal -prn ativan for seizures -continue Keppra - wean AEDs -continue thiamine, folic acid, and mvi  Hyponatremia Chronic. Likely related to known alcohol use, acute dehydration.  Na increased 10/16/21 in first 24 hrs - overcorrecting.  - telemetry monitoring - receiving desmopressin 1 dos today  Hypotension - ? Dehydration. Net negative 6L. Likely water diuresis related to hyponatremia correction. - fluids - desmopressin   Hypokalemia Hypomagnesemia Hypophosphatemia -Repleted K and mag -K phos 250mg  -Trend and replete electrolytes as needed  Leukocytosis:  Likely reactive. CXR and UA negative.  Improving -Trend WBC/fever  Depression:  Per daughter takes trazodone -hold trazodone   Best Practice (right click and "Reselect all SmartList Selections" daily)   Diet/type: NPO DVT prophylaxis: LMWH GI prophylaxis: N/A Lines: N/A Foley:  N/A Code Status:  full code Last date of multidisciplinary goals of care discussion [pending, improving.  Pt and daughter updated at the  bedside]  Labs   CBC: Recent Labs  Lab 10/12/21 2240 10/13/21 0541 10/14/21 0657  WBC 14.8* 12.9* 8.3  NEUTROABS 12.3*  --   --   HGB 12.6 12.1 11.8*  HCT 34.2* 32.4* 32.3*  MCV 86.4 85.3 86.8  PLT 322 289 265    Basic Metabolic Panel: Recent Labs  Lab 10/12/21 2240 10/13/21 0207 10/13/21 0541 10/13/21 1021 10/13/21 1438 10/13/21 1809 10/13/21 2330 10/14/21 0657  NA 111* 116*   < > 124* 124* 126* 131* 132*  K 2.9* 3.3*   < > 3.4* 3.5 3.4* 3.0* 3.7  CL 79* 85*   < > 93* 93* 93* 98 99  CO2 17* 21*   < > 23 22 25 27 24   GLUCOSE 131* 107*   < > 111* 127* 110* 112* 129*  BUN 6 5*   < > <5* <5* <5* <5* <5*  CREATININE 0.65 0.60   < > 0.56 0.77 0.72 0.73 0.71  CALCIUM 8.5* 7.9*   < > 8.5* 8.5* 8.6* 8.6* 8.6*  MG 1.2*  --   --  2.0  --   --   --  2.0  PHOS  --  1.8*  --   --  1.7*  --  2.3*  --    < > = values in this interval not displayed.   GFR: Estimated Creatinine Clearance: 81.9 mL/min (by C-G formula based on SCr of 0.71 mg/dL). Recent Labs  Lab 10/12/21 2240 10/13/21 0541 10/14/21 0657  WBC 14.8* 12.9* 8.3    Liver Function Tests: Recent Labs  Lab 10/12/21 2240 10/13/21 0541  AST 42* 32  ALT 20 18  ALKPHOS 54 49  BILITOT 0.7 0.9  PROT 5.4* 5.3*  ALBUMIN 3.6 3.3*   No results for input(s): "LIPASE", "AMYLASE" in the last 168 hours. No results for input(s): "AMMONIA" in the last 168 hours.  ABG No results found for: "PHART", "PCO2ART", "PO2ART", "HCO3", "TCO2", "ACIDBASEDEF", "O2SAT"   Coagulation Profile: No results for input(s): "INR", "PROTIME" in the last 168 hours.  Cardiac Enzymes: Recent Labs  Lab 10/13/21 0207  CKTOTAL 654*    HbA1C: No results found for: "HGBA1C"  CBG: Recent Labs  Lab 10/13/21 1517 10/13/21 1917 10/13/21 2323 10/14/21 0315 10/14/21 0758  GLUCAP 125* 128* 124* 141* 131*    Review of Systems:   Patient is encephalopathic and/or intubated. Therefore history has been obtained from chart review.     Past Medical History:  She,  has a past medical history of Depression and Insomnia.   Surgical History:  History reviewed. No pertinent surgical history.   Social History:   reports that she has quit smoking. Her smoking use included cigarettes. She has never used smokeless tobacco. She reports current alcohol use of about 4.0 standard drinks of alcohol per week. She reports that she does not currently use drugs.   Family History:  Her family history is not on file.   Allergies Allergies  Allergen Reactions   Hydrocodone Other (See Comments)    Syncope      Home Medications  Prior to Admission medications   Not on  File     Critical care time: 35 mins     CRITICAL CARE Performed by: Adron Bene   Total critical care time: 35 minutes  Critical care time was exclusive of separately billable procedures and treating other patients.  Critical care was necessary to treat or prevent imminent or life-threatening deterioration.  Critical care was time spent personally by me on the following activities: development of treatment plan with patient and/or surrogate as well as nursing, discussions with consultants, evaluation of patient's response to treatment, examination of patient, obtaining history from patient or surrogate, ordering and performing treatments and interventions, ordering and review of laboratory studies, ordering and review of radiographic studies, pulse oximetry and re-evaluation of patient's condition.    Darcella Gasman Gleason, PA-C [facility] See Amion for pager If no response to pager , please call [phone] until 7pm After 7:00 pm call Elink  [phone]

## 2021-10-14 NOTE — Progress Notes (Signed)
Na+ overcorrecting.  Ordering DDAVP, con't D5w.  Steffanie Dunn, DO 10/14/21 7:21 AM [facility]

## 2021-10-14 NOTE — Consult Note (Signed)
[facility]  HISTORY AND PHYSICAL  Deborah Lucero is an 61 y.o. female.    Chief Complaint: seizures  HPI: Pt is a 77F with a PMH of EtOH abuse who is now seen in consultation at the request of Dr Chestine Spore for evaluation and recommendations surrounding hyponatremia.    Pt was at the beach over the weekend with her friends and had a seizure there.  Went to ED and was discharged.  She had several more seizures per dtr, longest lasting about 4 min.  Came to ED here.  CT head negative.  Was found to have a Na of 111.  Got a 250 cc bolus of hypertonic saline.  Also found to be severely hypokalemic with K of 2.9 requiring multiple runs of K phos and Kcl.  Na overcorrected and was 132 this AM.  Was started on DDAVP and D5W.  In this setting we are asked to see.  Pt is still somewhat confused.  Son is at bedside.  According to records looks like she had 8L UOP in the past 24 hrs.  Zoloft on OP med list.  Na trend as below: 111 10/12/21 22:40 116 10/13/21 0207 119 10/13/21 0541 124 10/13/21 10:21 124 10/13/21 14:38 126 10/13/21 18:09 131 10/13/21 23:30 132 10/14/21 06:57--> DDAVP and D5W ordered.  PMH: Past Medical History:  Diagnosis Date   Depression    Insomnia    PSH: History reviewed. No pertinent surgical history.    Past Medical History:  Diagnosis Date   Depression    Insomnia     Medications:  Scheduled:  Chlorhexidine Gluconate Cloth  6 each Topical Daily   desmopressin  4 mcg Intravenous Once   enoxaparin (LOVENOX) injection  40 mg Subcutaneous Q24H   folic acid  1 mg Oral Daily   levETIRAcetam  500 mg Oral BID   multivitamin with minerals  1 tablet Oral Daily   phenobarbital  64.8 mg Oral Q8H   Followed by   Melene Muller ON 10/16/2021] phenobarbital  32.4 mg Oral Q8H   [START ON 10/15/2021] thiamine  100 mg Oral Daily    Medications Prior to Admission  Medication Sig Dispense Refill   APPLE CIDER VINEGAR PO Take 1 tablet by mouth daily.      Caffeine-Magnesium Salicylate (DIUREX PO) Take 1 tablet by mouth daily as needed (swelling).     ibuprofen (ADVIL) 200 MG tablet Take 200-600 mg by mouth daily as needed for headache or mild pain.     levETIRAcetam (KEPPRA) 500 MG tablet Take 500 mg by mouth 2 (two) times daily.     Multiple Vitamin (MULTIVITAMIN) tablet Take 1 tablet by mouth daily.     naproxen (NAPROSYN) 500 MG tablet Take 500 mg by mouth 2 (two) times daily as needed for pain.     pantoprazole (PROTONIX) 40 MG tablet Take 40 mg by mouth daily.     sertraline (ZOLOFT) 100 MG tablet Take 100 mg by mouth daily.     traZODone (DESYREL) 50 MG tablet Take 50 mg by mouth at bedtime.      ALLERGIES:   Allergies  Allergen Reactions   Hydrocodone Other (See Comments)    Syncope     FAM HX: History reviewed. No pertinent family history.  Social History:   reports that she has quit smoking. Her smoking use included cigarettes. She has never used smokeless tobacco. She reports current alcohol use of about 4.0 standard drinks of alcohol per week. She reports that she does  not currently use drugs.  ROS: ROS: all other systems reviewed and are negative except as per HPI  Blood pressure 107/89, pulse 70, temperature 98.8 F (37.1 C), temperature source Oral, resp. rate 16, height 5\' 6"  (1.676 m), weight 80.2 kg, SpO2 94 %. PHYSICAL EXAM: Physical Exam GEN lying in bed NAD HEENT EOMI PERRL NECK no JVD PULM clear  CV RRR ABD soft, nontender EXT no LE edema NEURO AAO x 3 but still a little confused about timeline, somewhat tangential   Results for orders placed or performed during the hospital encounter of 10/12/21 (from the past 48 hour(s))  CBC with Differential/Platelet     Status: Abnormal   Collection Time: 10/12/21 10:40 PM  Result Value Ref Range   WBC 14.8 (H) 4.0 - 10.5 K/uL   RBC 3.96 3.87 - 5.11 MIL/uL   Hemoglobin 12.6 12.0 - 15.0 g/dL   HCT 12/12/21 (L) 97.9 - 89.2 %   MCV 86.4 80.0 - 100.0 fL   MCH 31.8  26.0 - 34.0 pg   MCHC 36.8 (H) 30.0 - 36.0 g/dL   RDW 11.9 41.7 - 40.8 %   Platelets 322 150 - 400 K/uL   nRBC 0.0 0.0 - 0.2 %   Neutrophils Relative % 83 %   Neutro Abs 12.3 (H) 1.7 - 7.7 K/uL   Lymphocytes Relative 11 %   Lymphs Abs 1.6 0.7 - 4.0 K/uL   Monocytes Relative 5 %   Monocytes Absolute 0.8 0.1 - 1.0 K/uL   Eosinophils Relative 0 %   Eosinophils Absolute 0.0 0.0 - 0.5 K/uL   Basophils Relative 0 %   Basophils Absolute 0.0 0.0 - 0.1 K/uL   Immature Granulocytes 1 %   Abs Immature Granulocytes 0.10 (H) 0.00 - 0.07 K/uL    Comment: Performed at [facility] Lab, [address], [place], [place]  Comprehensive metabolic panel     Status: Abnormal   Collection Time: 10/12/21 10:40 PM  Result Value Ref Range   Sodium 111 (LL) 135 - 145 mmol/L    Comment: CRITICAL RESULT CALLED TO, READ BACK BY AND VERIFIED WITH TATE G,RN 10/13/21 0023 WAYK REPEATED TO VERIFY CORRECTED ON 08/13 AT 0025: PREVIOUSLY REPORTED AS 111 CRITICAL RESULT CALLED TO, READ BACK BY AND VERIFIED WITH TATE G,RN 10/13/21 0023 WAYK    Potassium 2.9 (L) 3.5 - 5.1 mmol/L   Chloride 79 (L) 98 - 111 mmol/L   CO2 17 (L) 22 - 32 mmol/L   Glucose, Bld 131 (H) 70 - 99 mg/dL    Comment: Glucose reference range applies only to samples taken after fasting for at least 8 hours.   BUN 6 6 - 20 mg/dL   Creatinine, Ser 10/15/21 0.44 - 1.00 mg/dL   Calcium 8.5 (L) 8.9 - 10.3 mg/dL   Total Protein 5.4 (L) 6.5 - 8.1 g/dL   Albumin 3.6 3.5 - 5.0 g/dL   AST 42 (H) 15 - 41 U/L   ALT 20 0 - 44 U/L   Alkaline Phosphatase 54 38 - 126 U/L   Total Bilirubin 0.7 0.3 - 1.2 mg/dL   GFR, Estimated 3.14 >97 mL/min    Comment: (NOTE) Calculated using the CKD-EPI Creatinine Equation (2021)    Anion gap 15 5 - 15    Comment: Performed at [facility] Lab, [address], [place], [place]  Magnesium     Status: Abnormal   Collection Time: 10/12/21 10:40 PM  Result Value Ref Range  Magnesium 1.2 (L) 1.7 - 2.4  mg/dL    Comment: Performed at [facility] Lab, [address], [place], [place] [address]  CBG monitoring, ED     Status: Abnormal   Collection Time: 10/13/21 12:08 AM  Result Value Ref Range   Glucose-Capillary 111 (H) 70 - 99 mg/dL    Comment: Glucose reference range applies only to samples taken after fasting for at least 8 hours.  Ethanol     Status: None   Collection Time: 10/13/21 12:15 AM  Result Value Ref Range   Alcohol, Ethyl (B) <10 <10 mg/dL    Comment: (NOTE) Lowest detectable limit for serum alcohol is 10 mg/dL.  For medical purposes only. Performed at [facility] Lab, [address] [place], [place] [address]   Osmolality     Status: Abnormal   Collection Time: 10/13/21  1:06 AM  Result Value Ref Range   Osmolality 249 (LL) 275 - 295 mOsm/kg    Comment: REPEATED TO VERIFY CRITICAL RESULT CALLED TO, READ BACK BY AND VERIFIED WITH: TATE,G RN @0144  10/13/2021 MITCHELL,L Performed at [facility] Lab, [address] [place], [place]   TSH     Status: None   Collection Time: 10/13/21  1:06 AM  Result Value Ref Range   TSH 1.513 0.350 - 4.500 uIU/mL    Comment: Performed by a 3rd Generation assay with a functional sensitivity of <=0.01 uIU/mL. Performed at [facility], [address], [place], [place]   Cortisol     Status: None   Collection Time: 10/13/21  1:06 AM  Result Value Ref Range   Cortisol, Plasma 20.2 ug/dL    Comment: (NOTE) AM    6.7 - 22.6 ug/dL PM   10/15/21       ug/dL Performed at [facility] Lab, [address], [place], [place]   HIV Antibody (routine testing w rflx)     Status: None   Collection Time: 10/13/21  2:07 AM  Result Value Ref Range   HIV Screen 4th Generation wRfx Non Reactive Non Reactive    Comment: Performed at [facility], [address], [place], [place]  Osmolality     Status: Abnormal   Collection Time: 10/13/21  2:07 AM  Result Value Ref Range    Osmolality 239 (LL) 275 - 295 mOsm/kg    Comment: REPEATED TO VERIFY CRITICAL RESULT CALLED TO, READ BACK BY AND VERIFIED WITH08/15/23 RN AT Janalyn Shy 10/13/2021 MITCHELL,L Performed at [facility] Lab, [address] [place], [place]   Basic metabolic panel     Status: Abnormal   Collection Time: 10/13/21  2:07 AM  Result Value Ref Range   Sodium 116 (LL) 135 - 145 mmol/L    Comment: CRITICAL RESULT CALLED TO, READ BACK BY AND VERIFIED WITH SHEPHERD B,RN 10/13/21 0303 WAYK   Potassium 3.3 (L) 3.5 - 5.1 mmol/L   Chloride 85 (L) 98 - 111 mmol/L   CO2 21 (L) 22 - 32 mmol/L   Glucose, Bld 107 (H) 70 - 99 mg/dL    Comment: Glucose reference range applies only to samples taken after fasting for at least 8 hours.   BUN 5 (L) 6 - 20 mg/dL   Creatinine, Ser 10/15/21 0.44 - 1.00 mg/dL   Calcium 7.9 (L) 8.9 - 10.3 mg/dL   GFR, Estimated 4.43 >15 mL/min    Comment: (NOTE) Calculated using the CKD-EPI Creatinine  Equation (2021)    Anion gap 10 5 - 15    Comment: Performed at [facility], [address] [place], [place] [address]  CK     Status: Abnormal   Collection Time: 10/13/21  2:07 AM  Result Value Ref Range   Total CK 654 (H) 38 - 234 U/L    Comment: Performed at [facility], [address] [place], [place] [address]  Phosphorus     Status: Abnormal   Collection Time: 10/13/21  2:07 AM  Result Value Ref Range   Phosphorus 1.8 (L) 2.5 - 4.6 mg/dL    Comment: Performed at [facility], [address] [place], [place] [address]  MRSA Next Gen by PCR, Nasal     Status: None   Collection Time: 10/13/21  3:01 AM   Specimen: Nasal Mucosa; Nasal Swab  Result Value Ref Range   MRSA by PCR Next Gen NOT DETECTED NOT DETECTED    Comment: (NOTE) The GeneXpert MRSA Assay (FDA approved for NASAL specimens only), is one component of a comprehensive MRSA colonization surveillance program. It is not intended to diagnose MRSA infection nor to guide or monitor treatment  for MRSA infections. Test performance is not FDA approved in patients less than 681 years old. Performed at [facility], [address] [place], [place] [address]   Glucose, capillary     Status: Abnormal   Collection Time: 10/13/21  3:11 AM  Result Value Ref Range   Glucose-Capillary 102 (H) 70 - 99 mg/dL    Comment: Glucose reference range applies only to samples taken after fasting for at least 8 hours.  Urinalysis, Routine w reflex microscopic Urine, Catheterized     Status: Abnormal   Collection Time: 10/13/21  3:45 AM  Result Value Ref Range   Color, Urine YELLOW YELLOW   APPearance HAZY (A) CLEAR   Specific Gravity, Urine 1.006 1.005 - 1.030   pH 6.0 5.0 - 8.0   Glucose, UA NEGATIVE NEGATIVE mg/dL   Hgb urine dipstick SMALL (A) NEGATIVE   Bilirubin Urine NEGATIVE NEGATIVE   Ketones, ur 20 (A) NEGATIVE mg/dL   Protein, ur NEGATIVE NEGATIVE mg/dL   Nitrite NEGATIVE NEGATIVE   Leukocytes,Ua NEGATIVE NEGATIVE   RBC / HPF 0-5 0 - 5 RBC/hpf   WBC, UA 6-10 0 - 5 WBC/hpf   Bacteria, UA NONE SEEN NONE SEEN   Squamous Epithelial / LPF 0-5 0 - 5   Mucus PRESENT    Hyaline Casts, UA PRESENT     Comment: Performed at [facility], [address] [place], [place] [address]  Rapid urine drug screen (hospital performed)     Status: Abnormal   Collection Time: 10/13/21  3:45 AM  Result Value Ref Range   Opiates NONE DETECTED NONE DETECTED   Cocaine NONE DETECTED NONE DETECTED   Benzodiazepines POSITIVE (A) NONE DETECTED   Amphetamines NONE DETECTED NONE DETECTED   Tetrahydrocannabinol NONE DETECTED NONE DETECTED   Barbiturates NONE DETECTED NONE DETECTED    Comment: (NOTE) DRUG SCREEN FOR MEDICAL PURPOSES ONLY.  IF CONFIRMATION IS NEEDED FOR ANY PURPOSE, NOTIFY LAB WITHIN 5 DAYS.  LOWEST DETECTABLE LIMITS FOR URINE DRUG SCREEN Drug Class                     Cutoff (ng/mL) Amphetamine and metabolites    1000 Barbiturate and metabolites    200 Benzodiazepine  200 Tricyclics and metabolites     300 Opiates and metabolites        300 Cocaine and metabolites        300 THC                            50 Performed at [facility] Lab, [address], [place], [place] [address]   Na and K (sodium & potassium), rand urine     Status: None   Collection Time: 10/13/21  3:45 AM  Result Value Ref Range   Sodium, Ur 31 mmol/L   Potassium Urine 20 mmol/L    Comment: Performed at [facility], [address] [place], [place] [address]  Osmolality, urine     Status: Abnormal   Collection Time: 10/13/21  3:45 AM  Result Value Ref Range   Osmolality, Ur 239 (L) 300 - 900 mOsm/kg    Comment: Performed at [facility], [address], [place], [place] [address]  Uric acid, random urine     Status: None   Collection Time: 10/13/21  3:45 AM  Result Value Ref Range   Uric Acid, Urine 17.1 Not Estab. mg/dL    Comment: (NOTE) Performed At: [facility] [address] [place], [place] [id number] Jolene Schimke MD OZ:[id number]   Comprehensive metabolic panel     Status: Abnormal   Collection Time: 10/13/21  5:41 AM  Result Value Ref Range   Sodium 119 (LL) 135 - 145 mmol/L    Comment: CRITICAL RESULT CALLED TO, READ BACK BY AND VERIFIED WITH ELIAS M,RN 10/13/21 0628 WAYK   Potassium 2.9 (L) 3.5 - 5.1 mmol/L   Chloride 86 (L) 98 - 111 mmol/L   CO2 22 22 - 32 mmol/L   Glucose, Bld 109 (H) 70 - 99 mg/dL    Comment: Glucose reference range applies only to samples taken after fasting for at least 8 hours.   BUN <5 (L) 6 - 20 mg/dL   Creatinine, Ser 6.29 0.44 - 1.00 mg/dL   Calcium 8.1 (L) 8.9 - 10.3 mg/dL   Total Protein 5.3 (L) 6.5 - 8.1 g/dL   Albumin 3.3 (L) 3.5 - 5.0 g/dL   AST 32 15 - 41 U/L   ALT 18 0 - 44 U/L   Alkaline Phosphatase 49 38 - 126 U/L   Total Bilirubin 0.9 0.3 - 1.2 mg/dL   GFR, Estimated >52 >84 mL/min    Comment: (NOTE) Calculated using the CKD-EPI Creatinine Equation (2021)    Anion gap 11 5 -  15    Comment: Performed at [facility], [address], [place], [place] [address]  CBC     Status: Abnormal   Collection Time: 10/13/21  5:41 AM  Result Value Ref Range   WBC 12.9 (H) 4.0 - 10.5 K/uL   RBC 3.80 (L) 3.87 - 5.11 MIL/uL   Hemoglobin 12.1 12.0 - 15.0 g/dL   HCT 01.0 (L) 27.2 - 53.6 %   MCV 85.3 80.0 - 100.0 fL   MCH 31.8 26.0 - 34.0 pg   MCHC 37.3 (H) 30.0 - 36.0 g/dL   RDW 64.4 03.4 - 74.2 %   Platelets 289 150 - 400 K/uL   nRBC 0.0 0.0 - 0.2 %    Comment: Performed at [facility], [address], [place], [place] [address]  Glucose, capillary     Status: Abnormal   Collection  Time: 10/13/21  7:55 AM  Result Value Ref Range   Glucose-Capillary 119 (H) 70 - 99 mg/dL    Comment: Glucose reference range applies only to samples taken after fasting for at least 8 hours.  Basic metabolic panel     Status: Abnormal   Collection Time: 10/13/21 10:21 AM  Result Value Ref Range   Sodium 124 (L) 135 - 145 mmol/L   Potassium 3.4 (L) 3.5 - 5.1 mmol/L   Chloride 93 (L) 98 - 111 mmol/L   CO2 23 22 - 32 mmol/L   Glucose, Bld 111 (H) 70 - 99 mg/dL    Comment: Glucose reference range applies only to samples taken after fasting for at least 8 hours.   BUN <5 (L) 6 - 20 mg/dL   Creatinine, Ser 9.37 0.44 - 1.00 mg/dL   Calcium 8.5 (L) 8.9 - 10.3 mg/dL   GFR, Estimated >90 >24 mL/min    Comment: (NOTE) Calculated using the CKD-EPI Creatinine Equation (2021)    Anion gap 8 5 - 15    Comment: Performed at [facility], [address] [place], [place] [address]  Magnesium     Status: None   Collection Time: 10/13/21 10:21 AM  Result Value Ref Range   Magnesium 2.0 1.7 - 2.4 mg/dL    Comment: Performed at [facility], [address] [place], [place] [address]  Glucose, capillary     Status: Abnormal   Collection Time: 10/13/21 11:15 AM  Result Value Ref Range   Glucose-Capillary 106 (H) 70 - 99 mg/dL    Comment: Glucose reference range applies only  to samples taken after fasting for at least 8 hours.  Basic metabolic panel     Status: Abnormal   Collection Time: 10/13/21  2:38 PM  Result Value Ref Range   Sodium 124 (L) 135 - 145 mmol/L   Potassium 3.5 3.5 - 5.1 mmol/L   Chloride 93 (L) 98 - 111 mmol/L   CO2 22 22 - 32 mmol/L   Glucose, Bld 127 (H) 70 - 99 mg/dL    Comment: Glucose reference range applies only to samples taken after fasting for at least 8 hours.   BUN <5 (L) 6 - 20 mg/dL   Creatinine, Ser 4.26 0.44 - 1.00 mg/dL   Calcium 8.5 (L) 8.9 - 10.3 mg/dL   GFR, Estimated >83 >41 mL/min    Comment: (NOTE) Calculated using the CKD-EPI Creatinine Equation (2021)    Anion gap 9 5 - 15    Comment: Performed at [facility], [address] [place], [place] [address]  Phosphorus     Status: Abnormal   Collection Time: 10/13/21  2:38 PM  Result Value Ref Range   Phosphorus 1.7 (L) 2.5 - 4.6 mg/dL    Comment: Performed at [facility], [address] [place], [place] [address]  Glucose, capillary     Status: Abnormal   Collection Time: 10/13/21  3:17 PM  Result Value Ref Range   Glucose-Capillary 125 (H) 70 - 99 mg/dL    Comment: Glucose reference range applies only to samples taken after fasting for at least 8 hours.  Basic metabolic panel     Status: Abnormal   Collection Time: 10/13/21  6:09 PM  Result Value Ref Range   Sodium 126 (L) 135 - 145 mmol/L   Potassium 3.4 (L) 3.5 - 5.1 mmol/L   Chloride 93 (L) 98 - 111 mmol/L   CO2 25  22 - 32 mmol/L   Glucose, Bld 110 (H) 70 - 99 mg/dL    Comment: Glucose reference range applies only to samples taken after fasting for at least 8 hours.   BUN <5 (L) 6 - 20 mg/dL   Creatinine, Ser 1.61 0.44 - 1.00 mg/dL   Calcium 8.6 (L) 8.9 - 10.3 mg/dL   GFR, Estimated >09 >60 mL/min    Comment: (NOTE) Calculated using the CKD-EPI Creatinine Equation (2021)    Anion gap 8 5 - 15    Comment: Performed at [facility], [address], [place], [place] [address]   Glucose, capillary     Status: Abnormal   Collection Time: 10/13/21  7:17 PM  Result Value Ref Range   Glucose-Capillary 128 (H) 70 - 99 mg/dL    Comment: Glucose reference range applies only to samples taken after fasting for at least 8 hours.  Glucose, capillary     Status: Abnormal   Collection Time: 10/13/21 11:23 PM  Result Value Ref Range   Glucose-Capillary 124 (H) 70 - 99 mg/dL    Comment: Glucose reference range applies only to samples taken after fasting for at least 8 hours.  Basic metabolic panel     Status: Abnormal   Collection Time: 10/13/21 11:30 PM  Result Value Ref Range   Sodium 131 (L) 135 - 145 mmol/L   Potassium 3.0 (L) 3.5 - 5.1 mmol/L   Chloride 98 98 - 111 mmol/L   CO2 27 22 - 32 mmol/L   Glucose, Bld 112 (H) 70 - 99 mg/dL    Comment: Glucose reference range applies only to samples taken after fasting for at least 8 hours.   BUN <5 (L) 6 - 20 mg/dL   Creatinine, Ser 8.11 0.44 - 1.00 mg/dL   Calcium 8.6 (L) 8.9 - 10.3 mg/dL   GFR, Estimated >91 >47 mL/min    Comment: (NOTE) Calculated using the CKD-EPI Creatinine Equation (2021)    Anion gap 6 5 - 15    Comment: Performed at [facility] Lab, [address] [place], [place] [address]  Phosphorus     Status: Abnormal   Collection Time: 10/13/21 11:30 PM  Result Value Ref Range   Phosphorus 2.3 (L) 2.5 - 4.6 mg/dL    Comment: Performed at [facility], [address], [place], [place] [address]  Glucose, capillary     Status: Abnormal   Collection Time: 10/14/21  3:15 AM  Result Value Ref Range   Glucose-Capillary 141 (H) 70 - 99 mg/dL    Comment: Glucose reference range applies only to samples taken after fasting for at least 8 hours.  Basic metabolic panel     Status: Abnormal   Collection Time: 10/14/21  6:57 AM  Result Value Ref Range   Sodium 132 (L) 135 - 145 mmol/L   Potassium 3.7 3.5 - 5.1 mmol/L    Comment: DELTA CHECK NOTED   Chloride 99 98 - 111 mmol/L   CO2 24 22 - 32 mmol/L    Glucose, Bld 129 (H) 70 - 99 mg/dL    Comment: Glucose reference range applies only to samples taken after fasting for at least 8 hours.   BUN <5 (L) 6 - 20 mg/dL   Creatinine, Ser 6.57 0.44 - 1.00 mg/dL   Calcium 8.6 (L) 8.9 - 10.3 mg/dL   GFR, Estimated >84 >69 mL/min    Comment: (NOTE) Calculated using the CKD-EPI Creatinine Equation (2021)  Anion gap 9 5 - 15    Comment: Performed at [facility] Lab, [address], [place], [place] [address]  Magnesium     Status: None   Collection Time: 10/14/21  6:57 AM  Result Value Ref Range   Magnesium 2.0 1.7 - 2.4 mg/dL    Comment: Performed at [facility], [address] [place], [place] [address]  CBC     Status: Abnormal   Collection Time: 10/14/21  6:57 AM  Result Value Ref Range   WBC 8.3 4.0 - 10.5 K/uL   RBC 3.72 (L) 3.87 - 5.11 MIL/uL   Hemoglobin 11.8 (L) 12.0 - 15.0 g/dL   HCT 67.3 (L) 41.9 - 37.9 %   MCV 86.8 80.0 - 100.0 fL   MCH 31.7 26.0 - 34.0 pg   MCHC 36.5 (H) 30.0 - 36.0 g/dL   RDW 02.4 09.7 - 35.3 %   Platelets 265 150 - 400 K/uL   nRBC 0.0 0.0 - 0.2 %    Comment: Performed at [facility] Lab, [address], [place], [place] [address]  Osmolality, urine     Status: Abnormal   Collection Time: 10/14/21  7:49 AM  Result Value Ref Range   Osmolality, Ur 62 (L) 300 - 900 mOsm/kg    Comment: Performed at [facility], [address] [place], [place] [address]  Glucose, capillary     Status: Abnormal   Collection Time: 10/14/21  7:58 AM  Result Value Ref Range   Glucose-Capillary 131 (H) 70 - 99 mg/dL    Comment: Glucose reference range applies only to samples taken after fasting for at least 8 hours.  Serum Sodium     Status: Abnormal   Collection Time: 10/14/21 11:34 AM  Result Value Ref Range   Sodium 130 (L) 135 - 145 mmol/L    Comment: Performed at [facility] Lab, [address], [place], [place] [address]  Osmolality     Status: Abnormal   Collection Time: 10/14/21 11:34 AM   Result Value Ref Range   Osmolality 269 (L) 275 - 295 mOsm/kg    Comment: REPEATED TO VERIFY Performed at [facility] Lab, [address], [place], [place] [address]   Glucose, capillary     Status: Abnormal   Collection Time: 10/14/21 12:06 PM  Result Value Ref Range   Glucose-Capillary 124 (H) 70 - 99 mg/dL    Comment: Glucose reference range applies only to samples taken after fasting for at least 8 hours.  Osmolality, urine     Status: None   Collection Time: 10/14/21  1:20 PM  Result Value Ref Range   Osmolality, Ur 403 300 - 900 mOsm/kg    Comment: REPEATED TO VERIFY Performed at [facility] Lab, [address] [place], [place] [address]     DG CHEST PORT 1 VIEW  Result Date: 10/13/2021 CLINICAL DATA:  Cough EXAM: PORTABLE CHEST 1 VIEW COMPARISON:  11/02/2021 FINDINGS: The heart size and mediastinal contours are within normal limits. Both lungs are clear. The visualized skeletal structures are unremarkable. IMPRESSION: No active disease. Electronically Signed   By: Ernie Avena M.D.   On: 10/13/2021 13:54   EEG adult  Result Date: 10/13/2021 Rejeana Brock, MD     10/13/2021  8:55 AM History: 61 yo F being evaluated after seizure Sedation: None Technique: This EEG was acquired with electrodes placed according to the International 10-20 electrode system (including Fp1, Fp2,  F3, F4, C3, C4, P3, P4, O1, O2, T3, T4, T5, T6, A1, A2, Fz, Cz, Pz). The following electrodes were missing or displaced: none. Background: The majority of this EEG was recorded during sleep with bilaterally symmetric appearing sleep structures.  During a brief period of arousal, there is a posterior dominant rhythm of 8 to 9 Hz, as well as generalized irregular slow activities admixed in the background. Photic stimulation: Physiologic driving is not performed EEG Abnormalities: 1) generalized irregular slow activity Clinical Interpretation: This EEG is consistent with a mild generalized  nonspecific cerebral dysfunction (encephalopathy). There was no seizure or seizure predisposition recorded on this study. Please note that lack of epileptiform activity on EEG does not preclude the possibility of epilepsy. Ritta Slot, MD [facility] [phone] If 7pm- 7am, please page neurology on call as listed in AMION.   CT Head Wo Contrast  Result Date: 10/13/2021 CLINICAL DATA:  New onset seizures.  No trauma history. EXAM: CT HEAD WITHOUT CONTRAST TECHNIQUE: Contiguous axial images were obtained from the base of the skull through the vertex without intravenous contrast. RADIATION DOSE REDUCTION: This exam was performed according to the departmental dose-optimization program which includes automated exposure control, adjustment of the mA and/or kV according to patient size and/or use of iterative reconstruction technique. COMPARISON:  Head CT 11/02/2020 FINDINGS: Brain: No evidence of acute infarction, hemorrhage, hydrocephalus, extra-axial collection or mass lesion/mass effect. Vascular: No hyperdense vessel or unexpected calcification. There are scattered calcifications in the carotid siphons. Skull: Negative for fracture or focal lesion. Sinuses/Orbits: No acute findings. Other: Visualized mastoid air cells and middle ears are clear. IMPRESSION: No acute intracranial CT findings or interval changes. Scattered carotid atherosclerosis. Electronically Signed   By: Almira Bar M.D.   On: 10/13/2021 00:52    Assessment/Plan  Hyponatremia:  per history and per urine/ serum osms, had hypotonic hyponatremia which was the likely trigger for seizures.  What complicates this picture is the concomitant hypokalemia.  She got the 250 bolus of hypertonic and then got a sig amount of potassium supplementation.  When there is severe hypotonic hyponatremia, any solute given- whether Na or K- will cause a free water diuresis.  So the effect was additive, causing overcorrection.  Her robust UOP  therefore is probably not the result of diabetes insipidus.  - continue D5W at 200 mL/ hr  - have ordered another dose of DDAVP--> this can be redosed this evening/ today if needed  - continue q 4 hr Na checks  - ideally would target 125 as goal Na and then bring up slowly in the next 24 hrs  - strict I/O  2.  Seizures:  in the setting of hypotonic hyponatremia/ beer potomania  - seizure precautions  - keppra  3.  EtOH abuse  - on phenobarb taper and CIWA protocol per PCCM  Terrion Poblano 10/14/2021, 3:40 PM

## 2021-10-14 NOTE — Progress Notes (Addendum)
eLink Physician-Brief Progress Note Patient Name: Deborah Lucero DOB: 1960/10/08 MRN: [id number]   Date of Service  10/14/2021  HPI/Events of Note  Na came back 131, K 3, phos 2.3 On further questioning UO has been approximately 300/hr but has slowed down the past 2 hours to 150/hr  eICU Interventions  Increase D5W to 200 cc/hr Ordered potassium 10 meqs IV x 4 doses Ordered Kphos 15 mmol x 1 Add on Mg level     Intervention Category Intermediate Interventions: Electrolyte abnormality - evaluation and management  Darl Pikes 10/14/2021, 12:35 AM

## 2021-10-14 NOTE — Progress Notes (Signed)
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 UOP decreasing.

## 2021-10-15 DIAGNOSIS — R569 Unspecified convulsions: Secondary | ICD-10-CM | POA: Diagnosis not present

## 2021-10-15 DIAGNOSIS — F1010 Alcohol abuse, uncomplicated: Secondary | ICD-10-CM | POA: Diagnosis not present

## 2021-10-15 DIAGNOSIS — E871 Hypo-osmolality and hyponatremia: Secondary | ICD-10-CM | POA: Diagnosis not present

## 2021-10-15 LAB — CBC
HCT: 30.1 % — ABNORMAL LOW (ref 36.0–46.0)
Hemoglobin: 10.7 g/dL — ABNORMAL LOW (ref 12.0–15.0)
MCH: 31.5 pg (ref 26.0–34.0)
MCHC: 35.5 g/dL (ref 30.0–36.0)
MCV: 88.5 fL (ref 80.0–100.0)
Platelets: 259 10*3/uL (ref 150–400)
RBC: 3.4 MIL/uL — ABNORMAL LOW (ref 3.87–5.11)
RDW: 12.7 % (ref 11.5–15.5)
WBC: 10.3 10*3/uL (ref 4.0–10.5)
nRBC: 0 % (ref 0.0–0.2)

## 2021-10-15 LAB — SODIUM
Sodium: 122 mmol/L — ABNORMAL LOW (ref 135–145)
Sodium: 120 mmol/L — ABNORMAL LOW (ref 135–145)
Sodium: 119 mmol/L — CL (ref 135–145)
Sodium: 120 mmol/L — ABNORMAL LOW (ref 135–145)

## 2021-10-15 LAB — GLUCOSE, CAPILLARY
Glucose-Capillary: 110 mg/dL — ABNORMAL HIGH (ref 70–99)
Glucose-Capillary: 105 mg/dL — ABNORMAL HIGH (ref 70–99)
Glucose-Capillary: 95 mg/dL (ref 70–99)
Glucose-Capillary: 105 mg/dL — ABNORMAL HIGH (ref 70–99)

## 2021-10-15 LAB — BASIC METABOLIC PANEL
Anion gap: 9 (ref 5–15)
BUN: <5 mg/dL — ABNORMAL LOW (ref 6–20)
CO2: 23 mmol/L (ref 22–32)
Calcium: 8.8 mg/dL — ABNORMAL LOW (ref 8.9–10.3)
Chloride: 92 mmol/L — ABNORMAL LOW (ref 98–111)
Creatinine, Ser: 0.67 mg/dL (ref 0.44–1.00)
GFR, Estimated: >60 mL/min (ref >60)
Glucose, Bld: 98 mg/dL (ref 70–99)
Potassium: 3.3 mmol/L — ABNORMAL LOW (ref 3.5–5.1)
Sodium: 124 mmol/L — ABNORMAL LOW (ref 135–145)

## 2021-10-15 LAB — OSMOLALITY, URINE: Osmolality, Ur: 470 mOsm/kg (ref 300–900)

## 2021-10-15 LAB — PHOSPHORUS: Phosphorus: 3 mg/dL (ref 2.5–4.6)

## 2021-10-15 LAB — MAGNESIUM: Magnesium: 1.6 mg/dL — ABNORMAL LOW (ref 1.7–2.4)

## 2021-10-15 LAB — SODIUM, URINE, RANDOM: Sodium, Ur: 103 mmol/L

## 2021-10-15 MED ORDER — POTASSIUM CHLORIDE CRYS ER 20 MEQ PO TBCR
40.0000 meq | EXTENDED_RELEASE_TABLET | Freq: Once | ORAL | Status: AC
  Administered 2021-10-15: 40 meq via ORAL
  Filled 2021-10-15: qty 2

## 2021-10-15 MED ORDER — SODIUM CHLORIDE 0.9 % IV SOLN: INTRAVENOUS | Status: DC | PRN

## 2021-10-15 MED ORDER — POTASSIUM CHLORIDE CRYS ER 20 MEQ PO TBCR
20.0000 meq | EXTENDED_RELEASE_TABLET | ORAL | Status: AC
  Administered 2021-10-15 (×2): 20 meq via ORAL
  Filled 2021-10-15 (×2): qty 1

## 2021-10-15 MED ORDER — POTASSIUM CHLORIDE 10 MEQ/100ML IV SOLN
10.0000 meq | INTRAVENOUS | Status: DC
  Administered 2021-10-15: 10 meq via INTRAVENOUS
  Filled 2021-10-15 (×2): qty 100

## 2021-10-15 MED ORDER — DESMOPRESSIN ACETATE 4 MCG/ML IJ SOLN
0.5000 ug | Freq: Two times a day (BID) | INTRAMUSCULAR | Status: DC
Start: 2021-10-15 — End: 2021-10-15
  Administered 2021-10-15: 0.52 ug via INTRAVENOUS
  Filled 2021-10-15: qty 1

## 2021-10-15 MED ORDER — MAGNESIUM SULFATE 4 GM/100ML IV SOLN
4.0000 g | Freq: Once | INTRAVENOUS | Status: AC
Start: 2021-10-15 — End: 2021-10-15
  Administered 2021-10-15: 4 g via INTRAVENOUS
  Filled 2021-10-15: qty 100

## 2021-10-15 NOTE — Progress Notes (Signed)
[facility] Progress Note   Assessment/ Plan:    Hyponatremia:  per history and per urine/ serum osms, had hypotonic hyponatremia which was the likely trigger for seizures.  What complicates this picture is the concomitant hypokalemia.  She got the 250 bolus of hypertonic and then got a sig amount of potassium supplementation.  When there is severe hypotonic hyponatremia, any solute given- whether Na or K- will cause a free water diuresis.  So the effect was additive, causing overcorrection.  Her robust UOP therefore is probably not the result of diabetes insipidus.             - off D5W at 200 mL/ hr             - got DDAVP 4 mcg x 2 yesterday--> decrease dose today, retain BID dosing             - continue q 4 hr Na checks             - potassium has been supplemented already this AM  - expect Na to continue to improve             - strict I/O   2.  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  EtOH abuse             - on phenobarb taper and CIWA protocol per PCCM  - agree with PCCM- pt will need to remain abstinent from EtOH  4.  Hypokalemia:  - supplementing prn  5.  Dispo: in ICU  Subjective:    Seen in room.  Na down to 124-->122 after aggressive D5W and DDAVP yesterday, holding steady.  Off D5 this AM, DDAVP dose in half- d/w PCCM.     Objective:   BP 102/70   Pulse 67   Temp 98.3 F (36.8 C) (Oral)   Resp 14   Ht 5\' 6"  (1.676 m)   Wt 84.5 kg   SpO2 98%   BMI 30.07 kg/m   Intake/Output Summary (Last 24 hours) at 10/15/2021 1155 Last data filed at 10/15/2021 1100 Gross per 24 hour  Intake 2966.68 ml  Output 570 ml  Net 2396.68 ml   Weight change: 4.3 kg  Physical Exam: GEN lying in bed NAD, sleeping, arousable HEENT EOMI PERRL NECK no JVD PULM clear  CV RRR ABD soft, nontender EXT no LE edema NEURO AAO x 3, a little groggy  Imaging: DG CHEST PORT 1  VIEW  Result Date: 10/13/2021 CLINICAL DATA:  Cough EXAM: PORTABLE CHEST 1 VIEW COMPARISON:  11/02/2021 FINDINGS: The heart size and mediastinal contours are within normal limits. Both lungs are clear. The visualized skeletal structures are unremarkable. IMPRESSION: No active disease. Electronically Signed   By: 01/02/2022 M.D.   On: 10/13/2021 13:54    Labs: BMET Recent Labs  Lab 10/13/21 0207 10/13/21 0541 10/13/21 1021 10/13/21 1438 10/13/21 1809 10/13/21 2330 10/14/21 0657 10/14/21 1134 10/14/21 1525 10/14/21 1940 10/15/21 0110 10/15/21 0719  NA 116* 119* 124* 124* 126* 131* 132* 130* 126* 124* 124* 122*  K 3.3* 2.9* 3.4* 3.5 3.4* 3.0* 3.7  --   --   --  3.3*  --   CL 85* 86* 93* 93* 93* 98 99  --   --   --  92*  --   CO2 21* 22 23 22 25 27 24   --   --   --  23  --   GLUCOSE 107* 109* 111* 127* 110* 112* 129*  --   --   --  98  --   BUN 5* <5* <5* <5* <5* <5* <5*  --   --   --  <5*  --   CREATININE 0.60 0.58 0.56 0.77 0.72 0.73 0.71  --   --   --  0.67  --   CALCIUM 7.9* 8.1* 8.5* 8.5* 8.6* 8.6* 8.6*  --   --   --  8.8*  --   PHOS 1.8*  --   --  1.7*  --  2.3*  --   --   --   --  3.0  --    CBC Recent Labs  Lab 10/12/21 2240 10/13/21 0541 10/14/21 0657 10/15/21 0110  WBC 14.8* 12.9* 8.3 10.3  NEUTROABS 12.3*  --   --   --   HGB 12.6 12.1 11.8* 10.7*  HCT 34.2* 32.4* 32.3* 30.1*  MCV 86.4 85.3 86.8 88.5  PLT 322 289 265 259    Medications:     Chlorhexidine Gluconate Cloth  6 each Topical Daily   desmopressin  0.52 mcg Intravenous BID   enoxaparin (LOVENOX) injection  40 mg Subcutaneous Q24H   folic acid  1 mg Oral Daily   levETIRAcetam  500 mg Oral BID   multivitamin with minerals  1 tablet Oral Daily   phenobarbital  64.8 mg Oral Q8H   Followed by   10/17/21 ON 10/16/2021] phenobarbital  32.4 mg Oral Q8H   thiamine  100 mg Oral Daily    10/18/2021 MD 10/15/2021, 11:55 AM

## 2021-10-15 NOTE — Progress Notes (Signed)
[facility] ADULT ICU REPLACEMENT PROTOCOL   The patient does apply for the [facility] Adult ICU Electrolyte Replacment Protocol based on the criteria listed below:   1.Exclusion criteria: TCTS patients, ECMO patients, and Dialysis patients 2. Is GFR >/= 30 ml/min? Yes.    Patient's GFR today is >60 3. Is SCr </= 2? Yes.   Patient's SCr is 0.67 mg/dL 4. Did SCr increase >/= 0.5 in 24 hours? No. 5.Pt's weight >40kg  Yes.   6. Abnormal electrolyte(s): K+3.3, Mag 1.6  7. Electrolytes replaced per protocol 8.  Call MD STAT for K+ </= 2.5, Phos </= 1, or Mag </= 1 Physician:  Dr. Cecile Sheerer, Lilia Argue 10/15/2021 4:30 AM

## 2021-10-15 NOTE — Progress Notes (Signed)
eLink Physician-Brief Progress Note Patient Name: Deborah Lucero DOB: 1960-12-18 MRN: [id number]   Date of Service  10/15/2021  HPI/Events of Note  Na+ 124  eICU Interventions  D 5 % water gtt discontinued.  Intravenous KCL discontinued and replaced by oral KCL replacement.        [name] 10/15/2021, 4:56 AM

## 2021-10-15 NOTE — Progress Notes (Addendum)
NAME:  Deborah Lucero, MRN:  [id number], DOB:  1960/11/16, LOS: 2 ADMISSION DATE:  10/12/2021, CONSULTATION DATE:  8/13 REFERRING MD:  Dr. Wallace Cullens, CHIEF COMPLAINT:  hyponatremia; seizure   History of Present Illness:  Patient is a 61 year old F with no PMH on file presenting to [facility] ED on 8/12 with seizure.  Patient had a seizure on 8/11 at  [facility] and was seen in the hospital.  Had head CT which was negative and was started on Keppra twice daily.  Daughter states she drinks about 4 alcoholic seltzers a day. Denies drug use.  Patient had seizure a year ago from hyponatremia. Thought to be related to dehydration. Seen by neurology Dr. Gerilyn Pilgrim in [place]. No seizure meds given.  Around 8 PM on 8/12, EMS called due to patient staring off into space.  Around 9 PM patient had a witnessed seizure that lasted about 5 minutes. Given 2 versed. Appear to be tonic clonic with prolonged postictal phase.  Patient brought to [facility] ED.  Patient encephalopathic.  Given Keppra.  Hemodynamically stable.  Placed on 2 L [place].  Afebrile.  CT head with no acute abnormality.  Ethanol WNL.  NA 111, K2.9, mag 1.2.  Patient started on hypertonic saline.  She was repleted.  Neuro consulted. PCCM consulted for ICU admission.  Pertinent  Medical History  ETOH use and prior sz  Significant Hospital Events: Including procedures, antibiotic start and stop dates in addition to other pertinent events   8/13 admitted to [facility] hyponatremia and seizures, Na 111->119 after 500cc NS and  250cc bolus 3%, started on D5 50cc/hr  Interim History / Subjective:  No complaints. Denies overnight events. Alert and oriented.   Objective   Blood pressure (!) 113/42, pulse (!) 59, temperature 98.3 F (36.8 C), temperature source Oral, resp. rate 12, height 5\' 6"  (1.676 m), weight 84.5 kg, SpO2 97 %.        Intake/Output Summary (Last 24 hours) at 10/15/2021 0853 Last data filed at 10/15/2021 0630 Gross per 24 hour  Intake 2827.45 ml   Output 470 ml  Net 2357.45 ml   Filed Weights   10/13/21 0300 10/14/21 0600 10/15/21 0454  Weight: 84.5 kg 80.2 kg 84.5 kg   General:  Alert and oriented  HEENT: MM pink/moist, sclera anicteric  Neuro: somnolent but following commands, no focal deficits. AAOX3 CV: s1s2 rrr, no m/r/g PULM:  clear bilaterally, dry cough noted, no rhonchi or wheezing GI: soft, non-tender, non-distended Extremities: warm/dry, no edema  Skin: no rashes or lesions  Resolved Hospital Problem list     Assessment & Plan:   Seizure Acute encephalopathy In the setting of hyponatremia, suspect related [place] and ETOH abuse. Drinks 4 alcoholic seltzers/beer a day plus more on the weekends. Ethanol WNL. CT head wnl. Pt denies hx of withdrawal . Neuro following; EEG without clear seizure activity -Seizure precautions -ciwa protocol in place -phenobarbital for high risk of ETOH withdrawal -prn ativan for seizures -continue Keppra -continue thiamine, folic acid, and mvi  Hyponatremia Chronic. Likely related to known alcohol use, acute dehydration.  Na initially increased 10/17/21 in first 24 hrs - overcorrected. Started on DDAVP and D5 fluids with improvement to 124. DDAVP and fluids stopped.   Potassium repleted overnight, anticipate improvement in sodium. UOP greatly reduced with DDAVP - 0.5 DDAVP BID - stopped D5 - increase sodium slowly over next 24 hrs - Q4 BMP - Strict I+O - telemetry monitoring  Hypotension Dehydration. Likely water diuresis  related to hyponatremia correction. Improved with fluids. Fluids and DDAVP stopped overnight - monitor  Hypokalemia Hypomagnesemia Hypophosphatemia -Trend and replete electrolytes as needed  Leukocytosis:  Likely reactive. CXR and UA negative. Improving -Trend WBC/fever  Depression:  Per daughter takes trazodone -hold trazodone   Best Practice (right click and "Reselect all SmartList Selections" daily)   Diet/type:  NPO DVT prophylaxis: LMWH GI prophylaxis: N/A Lines: N/A Foley:  N/A Code Status:  full code Last date of multidisciplinary goals of care discussion [pending, improving.  Pt and daughter updated at the bedside]  Adron Bene, MD

## 2021-10-16 DIAGNOSIS — E871 Hypo-osmolality and hyponatremia: Secondary | ICD-10-CM | POA: Diagnosis not present

## 2021-10-16 LAB — BASIC METABOLIC PANEL
Anion gap: 9 (ref 5–15)
BUN: <5 mg/dL — ABNORMAL LOW (ref 6–20)
CO2: 24 mmol/L (ref 22–32)
Calcium: 8.9 mg/dL (ref 8.9–10.3)
Chloride: 90 mmol/L — ABNORMAL LOW (ref 98–111)
Creatinine, Ser: 0.72 mg/dL (ref 0.44–1.00)
GFR, Estimated: >60 mL/min (ref >60)
Glucose, Bld: 93 mg/dL (ref 70–99)
Potassium: 3.8 mmol/L (ref 3.5–5.1)
Sodium: 123 mmol/L — ABNORMAL LOW (ref 135–145)

## 2021-10-16 LAB — CBC
HCT: 33 % — ABNORMAL LOW (ref 36.0–46.0)
Hemoglobin: 12.3 g/dL (ref 12.0–15.0)
MCH: 31.9 pg (ref 26.0–34.0)
MCHC: 37.3 g/dL — ABNORMAL HIGH (ref 30.0–36.0)
MCV: 85.7 fL (ref 80.0–100.0)
Platelets: 331 10*3/uL (ref 150–400)
RBC: 3.85 MIL/uL — ABNORMAL LOW (ref 3.87–5.11)
RDW: 12.3 % (ref 11.5–15.5)
WBC: 9.1 10*3/uL (ref 4.0–10.5)
nRBC: 0 % (ref 0.0–0.2)

## 2021-10-16 LAB — SODIUM
Sodium: 128 mmol/L — ABNORMAL LOW (ref 135–145)
Sodium: 131 mmol/L — ABNORMAL LOW (ref 135–145)
Sodium: 129 mmol/L — ABNORMAL LOW (ref 135–145)
Sodium: 128 mmol/L — ABNORMAL LOW (ref 135–145)

## 2021-10-16 LAB — UREA NITROGEN, URINE: Urea Nitrogen, Ur: 213 mg/dL

## 2021-10-16 MED ORDER — DESMOPRESSIN ACE SPRAY REFRIG 0.01 % NA SOLN
1.0000 | Freq: Two times a day (BID) | NASAL | Status: DC
  Filled 2021-10-16: qty 5

## 2021-10-16 MED ORDER — DESMOPRESSIN ACETATE SPRAY 0.01 % NA SOLN
10.0000 ug | Freq: Two times a day (BID) | NASAL | Status: DC
  Administered 2021-10-16 – 2021-10-17 (×3): 10 ug via NASAL
  Filled 2021-10-16: qty 5

## 2021-10-16 MED ORDER — DEXTROSE 5 % IV SOLN: INTRAVENOUS | Status: DC

## 2021-10-16 MED ORDER — DESMOPRESSIN ACE SPRAY REFRIG 0.01 % NA SOLN: 1.0000 | Freq: Two times a day (BID) | NASAL | Status: DC

## 2021-10-16 NOTE — Progress Notes (Signed)
PROGRESS NOTE  Deborah Lucero  FKC:[id number] DOB: 1960/11/25 DOA: 10/12/2021 PCP: Pcp, No   Brief Narrative: Patient is a 61 year old female with history of chronic alcohol abuse who presented with a seizure on 8/11 at [facility] and was seen at local hospital.  Head CT was negative and was started on Keppra.  History of seizure a year ago from hyponatremia also.  She again had seizure-like episode on 8/12 and was brought to the emergency department.  Patient was encephalopathic on presentation.  CT head did not show any acute abnormalities.  Sodium was found to be 111.  Patient was admitted for the management of hyponatremic induced seizure.  Neurology consulted, admitted to ICU.  Nephrology also following.  Hospital course remarkable for hyponatremia.  Assessment & Plan:  Principal Problem:   Hyponatremia Active Problems:   Alcohol abuse   Seizure (HCC)   Acute encephalopathy  Seizure/acute encephalopathy: Presented with 2-3 episodes of seizure.  History of seizure in the past due to hyponatremia.  Postictal on presentation.  Initially started on CIWA protocol.  Currently not in withdrawal. Currently on Keppra.  We recommend her to follow-up with neurology as an outpatient.  EEG showed mild generalized nonspecific cerebral dysfunction, no seizure epileptiform activity. Currently she is alert and oriented.  Hyponatremia: This is most likely secondary to beer potomania/decreased fluid intake.  Patient might have been also dehydrated on presentation.  History of chronic hyponatremia.  Nephrology following.  Started on DDAVP.  Sodium still in the range of 120s.  Chronic alcohol abuse: Drinks beer.  Counseled for cessation.  Initially started on CIWA protocol, now on phenobarbital. continue thiamine folic acid.  Depression : On trazodone, sertraline at home  Electrolyte abnormalities: Continue to monitor and supplement as needed  Debility/deconditioning/weakness: PT/OT  following.         DVT prophylaxis:enoxaparin (LOVENOX) injection 40 mg Start: 10/13/21 1000     Code Status: Full Code  Family Communication: None at bedside  Patient status:Inpatient  Patient is from :Home  Anticipated discharge VC:[facility]  Estimated DC date: Waiting on improvement in sodium level, PT/OT evaluation   Consultants: PCCM, neurology, nephrology  Procedures:EEG  Antimicrobials:  Anti-infectives (From admission, onward)    None       Subjective: Patient seen and examined at the bedside this morning.  Hemodynamically stable.  Lying in bed.  She was sleeping when I arrived but readily woke up.  She was alert and oriented and answered all the questions.  Appears little weak  Objective: Vitals:   10/15/21 2000 10/15/21 2311 10/16/21 0337 10/16/21 0421  BP:  131/69 (!) 101/54   Pulse:  68 61   Resp:  18 18   Temp: 97.7 F (36.5 C) 97.8 F (36.6 C) 97.8 F (36.6 C)   TempSrc: Axillary Oral Oral   SpO2:  97% 95%   Weight:    82.6 kg  Height:        Intake/Output Summary (Last 24 hours) at 10/16/2021 0917 Last data filed at 10/16/2021 6759 Gross per 24 hour  Intake 750 ml  Output 4225 ml  Net -3475 ml   Filed Weights   10/14/21 0600 10/15/21 0454 10/16/21 0421  Weight: 80.2 kg 84.5 kg 82.6 kg    Examination:  General exam: Overall comfortable, not in distress HEENT: PERRL Respiratory system:  no wheezes or crackles  Cardiovascular system: S1 & S2 heard, RRR.  Gastrointestinal system: Abdomen is nondistended, soft and nontender. Central nervous system: Alert and oriented Extremities: No  edema, no clubbing ,no cyanosis Skin: No rashes, no ulcers,no icterus     Data Reviewed: I have personally reviewed following labs and imaging studies  CBC: Recent Labs  Lab 10/12/21 2240 10/13/21 0541 10/14/21 0657 10/15/21 0110 10/16/21 0408  WBC 14.8* 12.9* 8.3 10.3 9.1  NEUTROABS 12.3*  --   --   --   --   HGB 12.6 12.1 11.8* 10.7* 12.3  HCT  34.2* 32.4* 32.3* 30.1* 33.0*  MCV 86.4 85.3 86.8 88.5 85.7  PLT 322 289 265 259 331   Basic Metabolic Panel: Recent Labs  Lab 10/12/21 2240 10/13/21 0207 10/13/21 0541 10/13/21 1021 10/13/21 1438 10/13/21 1809 10/13/21 2330 10/14/21 0657 10/14/21 1134 10/15/21 0110 10/15/21 0719 10/15/21 1113 10/15/21 1602 10/15/21 2232 10/16/21 0408 10/16/21 0724  NA 111* 116*   < > 124* 124* 126* 131* 132*   < > 124*   < > 120* 119* 120* 123* 128*  K 2.9* 3.3*   < > 3.4* 3.5 3.4* 3.0* 3.7  --  3.3*  --   --   --   --  3.8  --   CL 79* 85*   < > 93* 93* 93* 98 99  --  92*  --   --   --   --  90*  --   CO2 17* 21*   < > 23 22 25 27 24   --  23  --   --   --   --  24  --   GLUCOSE 131* 107*   < > 111* 127* 110* 112* 129*  --  98  --   --   --   --  93  --   BUN 6 5*   < > <5* <5* <5* <5* <5*  --  <5*  --   --   --   --  <5*  --   CREATININE 0.65 0.60   < > 0.56 0.77 0.72 0.73 0.71  --  0.67  --   --   --   --  0.72  --   CALCIUM 8.5* 7.9*   < > 8.5* 8.5* 8.6* 8.6* 8.6*  --  8.8*  --   --   --   --  8.9  --   MG 1.2*  --   --  2.0  --   --   --  2.0  --  1.6*  --   --   --   --   --   --   PHOS  --  1.8*  --   --  1.7*  --  2.3*  --   --  3.0  --   --   --   --   --   --    < > = values in this interval not displayed.     Recent Results (from the past 240 hour(s))  MRSA Next Gen by PCR, Nasal     Status: None   Collection Time: 10/13/21  3:01 AM   Specimen: Nasal Mucosa; Nasal Swab  Result Value Ref Range Status   MRSA by PCR Next Gen NOT DETECTED NOT DETECTED Final    Comment: (NOTE) The GeneXpert MRSA Assay (FDA approved for NASAL specimens only), is one component of a comprehensive MRSA colonization surveillance program. It is not intended to diagnose MRSA infection nor to guide or monitor treatment for MRSA infections. Test performance is not FDA approved in patients less than 27 years old. Performed  at [facility], [address], [place], [place] [address]       Radiology Studies: No results found.  Scheduled Meds:  Chlorhexidine Gluconate Cloth  6 each Topical Daily   enoxaparin (LOVENOX) injection  40 mg Subcutaneous Q24H   folic acid  1 mg Oral Daily   levETIRAcetam  500 mg Oral BID   multivitamin with minerals  1 tablet Oral Daily   phenobarbital  64.8 mg Oral Q8H   Followed by   phenobarbital  32.4 mg Oral Q8H   thiamine  100 mg Oral Daily   Continuous Infusions:  sodium chloride Stopped (10/15/21 0442)     LOS: 3 days   Burnadette Pop, MD [facility] P8/16/2023, 9:17 AM

## 2021-10-16 NOTE — Plan of Care (Signed)

## 2021-10-16 NOTE — Evaluation (Signed)
Occupational Therapy Evaluation Patient Details Name: Deborah Lucero MRN: [id number] DOB: Apr 16, 1960 Today's Date: 10/16/2021   History of Present Illness Pt is a 61 year old woman who came to the ED on 10/13/21 with seizures and prolonged postictal state/confusion and hyponatremia. PMH: alcoholism, depression.   Clinical Impression   Pt is typically independent. She is doing well from a mobility standpoint and with basic ADLs, but demonstrates cognitive deficits in short term memory, processing and awareness of deficits. Pt's daughter observed administration of Mini-Cog screening. Educated pt and daughter in possible ramifications of pt's cognitive deficits, need for supervision with medications, follow up appointments, stove use, managing finances and in compensatory strategies. Pt is aware she may not drive, but has been non compliant in the past. Will follow acutely Recommending OPOT to further address cognition and IADLs.      Recommendations for follow up therapy are one component of a multi-disciplinary discharge planning process, led by the attending physician.  Recommendations may be updated based on patient status, additional functional criteria and insurance authorization.   Follow Up Recommendations  Outpatient OT    Assistance Recommended at Discharge Intermittent Supervision/Assistance  Patient can return home with the following Direct supervision/assist for financial management;Direct supervision/assist for medications management;Assist for transportation;Assistance with cooking/housework    Functional Status Assessment  Patient has had a recent decline in their functional status and demonstrates the ability to make significant improvements in function in a reasonable and predictable amount of time.  Equipment Recommendations  None recommended by OT    Recommendations for Other Services       Precautions / Restrictions Precautions Precautions: Other  (comment) Precaution Comments: seizure Restrictions Weight Bearing Restrictions: No      Mobility Bed Mobility Overal bed mobility: Modified Independent                  Transfers Overall transfer level: Modified independent Equipment used: None                      Balance                                           ADL either performed or assessed with clinical judgement   ADL Overall ADL's : Modified independent                                       General ADL Comments: Educated pt and daughter in cognitive deficits revealed this visit, possible ramifications and compensatory strategies.     Vision Baseline Vision/History: 0 No visual deficits Ability to See in Adequate Light: 0 Adequate Patient Visual Report: No change from baseline       Perception     Praxis      Pertinent Vitals/Pain Pain Assessment Pain Assessment: No/denies pain     Hand Dominance Right   Extremity/Trunk Assessment Upper Extremity Assessment Upper Extremity Assessment: Overall WFL for tasks assessed   Lower Extremity Assessment Lower Extremity Assessment: Defer to PT evaluation   Cervical / Trunk Assessment Cervical / Trunk Assessment: Normal   Communication Communication Communication: No difficulties   Cognition Arousal/Alertness: Awake/alert Behavior During Therapy: Flat affect Overall Cognitive Status: Impaired/Different from baseline Area of Impairment: Memory, Safety/judgement, Problem solving  Memory: Decreased short-term memory   Safety/Judgement: Decreased awareness of deficits   Problem Solving: Slow processing General Comments: Administered Mini-Cog with pt scoring 1/5 points with deficits in organization and STM.     General Comments       Exercises     Shoulder Instructions      Home Living Family/patient expects to be discharged to:: Private residence Living Arrangements:  Alone Available Help at Discharge: Family;Available PRN/intermittently Type of Home: House Home Access: Stairs to enter [employer] of Steps: 2 Entrance Stairs-Rails: Right;Left Home Layout: Two level Alternate Level Stairs-Number of Steps: flight Alternate Level Stairs-Rails: Right Bathroom Shower/Tub: Tub/shower unit;Walk-in shower   Bathroom Toilet: Standard     Home Equipment: None          Prior Functioning/Environment Prior Level of Function : Independent/Modified Independent                        OT Problem List: Decreased cognition      OT Treatment/Interventions: Cognitive remediation/compensation    OT Goals(Current goals can be found in the care plan section) Acute Rehab OT Goals OT Goal Formulation: With patient Time For Goal Achievement: 10/30/21 Potential to Achieve Goals: Good ADL Goals Additional ADL Goal #1: Pt and family will be aware of pt's cognitive deficits, possible safety concerns and compensatory strategies.  OT Frequency: Min 2X/week    Co-evaluation              AM-PAC OT "6 Clicks" Daily Activity     Outcome Measure Help from another person eating meals?: None Help from another person taking care of personal grooming?: None Help from another person toileting, which includes using toliet, bedpan, or urinal?: None Help from another person bathing (including washing, rinsing, drying)?: None Help from another person to put on and taking off regular upper body clothing?: None Help from another person to put on and taking off regular lower body clothing?: None 6 Click Score: 24   End of Session    Activity Tolerance: Patient tolerated treatment well Patient left: in bed;with call bell/phone within reach;with bed alarm set  OT Visit Diagnosis: Other symptoms and signs involving cognitive function                Time: 1324-4010 OT Time Calculation (min): 23 min Charges:  OT General Charges $OT Visit: 1 Visit OT  Evaluation $OT Eval Low Complexity: 1 Low OT Treatments $Self Care/Home Management : 8-22 mins  Berna Spare, OTR/L Acute Rehabilitation Services Office: [phone]  Evern Bio 10/16/2021, 1:39 PM

## 2021-10-16 NOTE — Progress Notes (Signed)
[facility] Progress Note   Assessment/ Plan:    Hyponatremia:  per history and per urine/ serum osms, had hypotonic hyponatremia which was the likely trigger for seizures.  What complicates this picture is the concomitant hypokalemia.  She got the 250 bolus of hypertonic and then got a sig amount of potassium supplementation.  When there is severe hypotonic hyponatremia, any solute given- whether Na or K- will cause a free water diuresis.  So the effect was additive, causing overcorrection.              - off D5W at 200 mL/ hr             - off DDAVP             - continue q 4 hr Na checks  - expect Na to continue to improve             - strict I/O  - initially I thought her polyuria was only the result of water diuresis--> but she's > 72 hrs out from hypertonic bolus and has already made 2L urine today off DDAVP--> Na is also rising quickly again.  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  I dont' think she needs any more IV DDAVP but I will give her low dose nasal DDAVP and check her Na in the PM   2.  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  EtOH abuse             - on phenobarb taper and CIWA protocol per PCCM  - agree with PCCM- pt will need to remain abstinent from EtOH  4.  Hypokalemia:  - supplementing prn  5.  Dispo: in ICU  Subjective:    Improving.  No complaints today. Na 122--> 119--> 128 this AM.  Overall a net 6 mEq gain in the past 24 hrs.  UOP is way higher again.   Objective:   BP (!) 98/58 (BP Location: Right Arm)   Pulse 63   Temp 97.8 F (36.6 C) (Oral)   Resp 15   Ht 5\' 6"  (1.676 m)   Wt 82.6 kg   SpO2 95%   BMI 29.39 kg/m   Intake/Output Summary (Last 24 hours) at 10/16/2021 1131 Last data filed at 10/16/2021 1043 Gross per 24 hour  Intake 500 ml  Output 6500 ml  Net -6000 ml   Weight  change: -1.9 kg  Physical Exam: GEN lying in bed NAD, sleeping, arousable HEENT EOMI PERRL NECK no JVD PULM clear  CV RRR ABD soft, nontender EXT no LE edema NEURO AAO x 3, a little groggy  Imaging: No results found.  Labs: BMET Recent Labs  Lab 10/13/21 0207 10/13/21 0541 10/13/21 1021 10/13/21 1438 10/13/21 1809 10/13/21 2330 10/14/21 0657 10/14/21 1134 10/15/21 0110 10/15/21 0719 10/15/21 1113 10/15/21 1602 10/15/21 2232 10/16/21 0408 10/16/21 0724  NA 116*   < > 124* 124* 126* 131* 132*   < > 124* 122* 120* 119* 120* 123* 128*  K 3.3*   < > 3.4* 3.5 3.4* 3.0* 3.7  --  3.3*  --   --   --   --  3.8  --   CL  85*   < > 93* 93* 93* 98 99  --  92*  --   --   --   --  90*  --   CO2 21*   < > 23 22 25 27 24   --  23  --   --   --   --  24  --   GLUCOSE 107*   < > 111* 127* 110* 112* 129*  --  98  --   --   --   --  93  --   BUN 5*   < > <5* <5* <5* <5* <5*  --  <5*  --   --   --   --  <5*  --   CREATININE 0.60   < > 0.56 0.77 0.72 0.73 0.71  --  0.67  --   --   --   --  0.72  --   CALCIUM 7.9*   < > 8.5* 8.5* 8.6* 8.6* 8.6*  --  8.8*  --   --   --   --  8.9  --   PHOS 1.8*  --   --  1.7*  --  2.3*  --   --  3.0  --   --   --   --   --   --    < > = values in this interval not displayed.   CBC Recent Labs  Lab 10/12/21 2240 10/13/21 0541 10/14/21 0657 10/15/21 0110 10/16/21 0408  WBC 14.8* 12.9* 8.3 10.3 9.1  NEUTROABS 12.3*  --   --   --   --   HGB 12.6 12.1 11.8* 10.7* 12.3  HCT 34.2* 32.4* 32.3* 30.1* 33.0*  MCV 86.4 85.3 86.8 88.5 85.7  PLT 322 289 265 259 331    Medications:     Chlorhexidine Gluconate Cloth  6 each Topical Daily   enoxaparin (LOVENOX) injection  40 mg Subcutaneous Q24H   folic acid  1 mg Oral Daily   levETIRAcetam  500 mg Oral BID   multivitamin with minerals  1 tablet Oral Daily   phenobarbital  64.8 mg Oral Q8H   Followed by   phenobarbital  32.4 mg Oral Q8H   thiamine  100 mg Oral Daily    10/18/21 MD 10/16/2021,  11:31 AM

## 2021-10-16 NOTE — Evaluation (Signed)
Physical Therapy Evaluation and Discharge Patient Details Name: Deborah Lucero MRN: [id number] DOB: [birth year] Today's Date: 10/16/2021  History of Present Illness  Pt is a 61 year old woman who came to the ED on 10/13/21 with seizures and prolonged postictal state/confusion and hyponatremia. PMH: alcoholism, depresssion.  Clinical Impression  Patient evaluated by Physical Therapy with no further acute PT needs identified. All education has been completed and the patient has no further questions. Pt overall at a supervision to mod I level with mobility tasks this session. No overt LOB noted, but did drift occasionally. Somewhat slow processing and would likely benefit from further cognitive testing. Pt reports she feels close to baseline, so no further skilled PT needs. See below for any follow-up Physical Therapy or equipment needs. PT is signing off. Thank you for this referral. If needs change, please re-consult.         Recommendations for follow up therapy are one component of a multi-disciplinary discharge planning process, led by the attending physician.  Recommendations may be updated based on patient status, additional functional criteria and insurance authorization.  Follow Up Recommendations No PT follow up      Assistance Recommended at Discharge Intermittent Supervision/Assistance  Patient can return home with the following  Assistance with cooking/housework;Assist for transportation    Equipment Recommendations None recommended by PT  Recommendations for Other Services       Functional Status Assessment Patient has had a recent decline in their functional status and demonstrates the ability to make significant improvements in function in a reasonable and predictable amount of time.     Precautions / Restrictions Precautions Precautions: Other (comment) Precaution Comments: seizure Restrictions Weight Bearing Restrictions: No      Mobility  Bed Mobility Overal  bed mobility: Modified Independent                  Transfers Overall transfer level: Modified independent                      Ambulation/Gait Ambulation/Gait assistance: Supervision Gait Distance (Feet): 200 Feet Assistive device: None Gait Pattern/deviations: Step-through pattern, Drifts right/left Gait velocity: Decreased     General Gait Details: Drifted occasionally to the L and R, but no overt LOB. Supervision for safety.  Stairs Stairs: Yes Stairs assistance: Supervision Stair Management: One rail Right Number of Stairs: 10 General stair comments: overall steady. No LOB noted.  Wheelchair Mobility    Modified Rankin (Stroke Patients Only)       Balance Overall balance assessment: Mild deficits observed, not formally tested                                           Pertinent Vitals/Pain Pain Assessment Pain Assessment: No/denies pain    Home Living Family/patient expects to be discharged to:: Private residence Living Arrangements: Alone Available Help at Discharge: Family;Available PRN/intermittently Type of Home: House Home Access: Stairs to enter Entrance Stairs-Rails: Doctor, general practice of Steps: 2 Alternate Level Stairs-Number of Steps: flight Home Layout: Two level Home Equipment: None      Prior Function Prior Level of Function : Independent/Modified Independent                     Hand Dominance        Extremity/Trunk Assessment   Upper Extremity Assessment Upper Extremity Assessment: Defer to  OT evaluation    Lower Extremity Assessment Lower Extremity Assessment: Overall WFL for tasks assessed    Cervical / Trunk Assessment Cervical / Trunk Assessment: Normal  Communication   Communication: No difficulties  Cognition Arousal/Alertness: Awake/alert Behavior During Therapy: WFL for tasks assessed/performed Overall Cognitive Status: Impaired/Different from baseline Area of  Impairment: Safety/judgement, Problem solving                         Safety/Judgement: Decreased awareness of safety   Problem Solving: Slow processing          General Comments      Exercises     Assessment/Plan    PT Assessment Patient does not need any further PT services  PT Problem List         PT Treatment Interventions      PT Goals (Current goals can be found in the Care Plan section)  Acute Rehab PT Goals Patient Stated Goal: to go home PT Goal Formulation: With patient Time For Goal Achievement: 10/16/21 Potential to Achieve Goals: Good    Frequency       Co-evaluation               AM-PAC PT "6 Clicks" Mobility  Outcome Measure Help needed turning from your back to your side while in a flat bed without using bedrails?: None Help needed moving from lying on your back to sitting on the side of a flat bed without using bedrails?: None Help needed moving to and from a bed to a chair (including a wheelchair)?: None Help needed standing up from a chair using your arms (e.g., wheelchair or bedside chair)?: None Help needed to walk in hospital room?: A Little Help needed climbing 3-5 steps with a railing? : A Little 6 Click Score: 22    End of Session Equipment Utilized During Treatment: Gait belt Activity Tolerance: Patient tolerated treatment well Patient left: in bed;with call bell/phone within reach;with bed alarm set Nurse Communication: Mobility status PT Visit Diagnosis: Other symptoms and signs involving the nervous system (G26.948)    Time: 5462-7035 PT Time Calculation (min) (ACUTE ONLY): 15 min   Charges:   PT Evaluation $PT Eval Low Complexity: 1 Low          Cindee Salt, DPT  Acute Rehabilitation Services  Office: [phone]   Lehman Prom 10/16/2021, 12:30 PM

## 2021-10-16 NOTE — Progress Notes (Signed)
  Transition of Care [facility]) Screening Note   Patient Details  Name: Deborah Lucero Date of Birth: 1960-11-30   Transition of Care [facility]) CM/SW Contact:    Harriet Masson, RN Phone Number: 10/16/2021, 8:28 AM    Transition of Care Department [facility]) has reviewed patient and no TOC needs have been identified at this time. We will continue to monitor patient advancement through interdisciplinary progression rounds. If new patient transition needs arise, please place a TOC consult.

## 2021-10-16 NOTE — Progress Notes (Signed)
PT Cancellation Note  Patient Details Name: Deborah Lucero MRN: [id number] DOB: November 10, 1960   Cancelled Treatment:    Reason Eval/Treat Not Completed: Other (comment) Pt sleeping very soundly. Will follow up as schedule allows.   Cindee Salt, DPT  Acute Rehabilitation Services  Office: [phone]    Lehman Prom 10/16/2021, 8:56 AM

## 2021-10-16 NOTE — Progress Notes (Addendum)
Patient had been started on Keppra at outside hospital after seizure in [facility] 1 week prior to admission. She had been taking this medication as prescribed.  There seems to be correlation between this patient's alcohol use, hyponatremia, and seizures. Called neurology yesterday to discuss this patient's AEDs. Neurology recommended continuing her AEDs for now and that she follow up with her neurologist as an outpatient to discuss possible discontinuation. Previously seen by Dr. Gerilyn Pilgrim in [place] for seizure. Was never started on seizure medication previously.

## 2021-10-16 NOTE — Plan of Care (Signed)

## 2021-10-17 DIAGNOSIS — E871 Hypo-osmolality and hyponatremia: Secondary | ICD-10-CM | POA: Diagnosis not present

## 2021-10-17 LAB — BASIC METABOLIC PANEL
Anion gap: 9 (ref 5–15)
BUN: 5 mg/dL — ABNORMAL LOW (ref 6–20)
CO2: 23 mmol/L (ref 22–32)
Calcium: 8.9 mg/dL (ref 8.9–10.3)
Chloride: 89 mmol/L — ABNORMAL LOW (ref 98–111)
Creatinine, Ser: 0.74 mg/dL (ref 0.44–1.00)
GFR, Estimated: >60 mL/min (ref >60)
Glucose, Bld: 104 mg/dL — ABNORMAL HIGH (ref 70–99)
Potassium: 3.5 mmol/L (ref 3.5–5.1)
Sodium: 121 mmol/L — ABNORMAL LOW (ref 135–145)

## 2021-10-17 LAB — SODIUM
Sodium: 122 mmol/L — ABNORMAL LOW (ref 135–145)
Sodium: 121 mmol/L — ABNORMAL LOW (ref 135–145)
Sodium: 122 mmol/L — ABNORMAL LOW (ref 135–145)
Sodium: 120 mmol/L — ABNORMAL LOW (ref 135–145)
Sodium: 122 mmol/L — ABNORMAL LOW (ref 135–145)

## 2021-10-17 MED ORDER — DESMOPRESSIN ACE SPRAY REFRIG 0.01 % NA SOLN
1.0000 | Freq: Every day | NASAL | Status: DC
  Filled 2021-10-17: qty 5

## 2021-10-17 MED ORDER — DESMOPRESSIN ACETATE SPRAY 0.01 % NA SOLN
10.0000 ug | Freq: Every day | NASAL | Status: DC
  Filled 2021-10-17: qty 5

## 2021-10-17 NOTE — Progress Notes (Signed)
Occupational Therapy Treatment Patient Details Name: Deborah Lucero MRN: [id number] DOB: 09-22-1960 Today's Date: 10/17/2021   History of present illness Pt is a 61 year old woman who came to the ED on 10/13/21 with seizures and prolonged postictal state/confusion and hyponatremia. PMH: alcoholism, depression.   OT comments  Pt sleepy, reports monitor alarming periodically all night and interrupting sleep. Pt ambulated and from bathroom with assist only for telemetry monitor. Pt unable to restate information provided by MD this morning. Daughter at bedside and aware of pt's memory deficits, need for help when pt goes to MD appointments. Pt returned to bed and immediately went back to sleep.    Recommendations for follow up therapy are one component of a multi-disciplinary discharge planning process, led by the attending physician.  Recommendations may be updated based on patient status, additional functional criteria and insurance authorization.    Follow Up Recommendations  Outpatient OT    Assistance Recommended at Discharge Intermittent Supervision/Assistance  Patient can return home with the following  Direct supervision/assist for financial management;Direct supervision/assist for medications management;Assist for transportation;Assistance with cooking/housework   Equipment Recommendations  None recommended by OT    Recommendations for Other Services      Precautions / Restrictions Precautions Precautions: Other (comment) Precaution Comments: seizure Restrictions Weight Bearing Restrictions: No       Mobility Bed Mobility Overal bed mobility: Independent             General bed mobility comments: HOB flat    Transfers Overall transfer level: Modified independent Equipment used: None               General transfer comment: slow to rise     Balance Overall balance assessment: Mild deficits observed, not formally tested                                          ADL either performed or assessed with clinical judgement   ADL Overall ADL's : Modified independent                                       General ADL Comments: pt ambulated to bathroom and back to bed, asked RN if pt may have portable tele box    Extremity/Trunk Assessment              Vision       Perception     Praxis      Cognition Arousal/Alertness: Awake/alert Behavior During Therapy: Flat affect Overall Cognitive Status: Impaired/Different from baseline Area of Impairment: Memory, Safety/judgement, Problem solving                     Memory: Decreased short-term memory   Safety/Judgement: Decreased awareness of deficits   Problem Solving: Slow processing          Exercises      Shoulder Instructions       General Comments      Pertinent Vitals/ Pain       Pain Assessment Pain Assessment: No/denies pain  Home Living                                          Prior  Functioning/Environment              Frequency  Min 2X/week        Progress Toward Goals  OT Goals(current goals can now be found in the care plan section)  Progress towards OT goals: Progressing toward goals  Acute Rehab OT Goals OT Goal Formulation: With patient Time For Goal Achievement: 10/30/21 Potential to Achieve Goals: Good  Plan Discharge plan remains appropriate    Co-evaluation                 AM-PAC OT "6 Clicks" Daily Activity     Outcome Measure   Help from another person eating meals?: None Help from another person taking care of personal grooming?: None Help from another person toileting, which includes using toliet, bedpan, or urinal?: None Help from another person bathing (including washing, rinsing, drying)?: None Help from another person to put on and taking off regular upper body clothing?: None Help from another person to put on and taking off regular lower body  clothing?: None 6 Click Score: 24    End of Session    OT Visit Diagnosis: Other symptoms and signs involving cognitive function   Activity Tolerance Patient limited by fatigue   Patient Left in bed;with call bell/phone within reach;with family/visitor present;with bed alarm set   Nurse Communication          Time: 1005-1016 OT Time Calculation (min): 11 min  Charges: OT General Charges $OT Visit: 1 Visit OT Treatments $Self Care/Home Management : 8-22 mins  Berna Spare, OTR/L Acute Rehabilitation Services Office: [phone]  Evern Bio 10/17/2021, 11:39 AM

## 2021-10-17 NOTE — Progress Notes (Signed)
[facility] Progress Note   Assessment/ Plan:    Hyponatremia:  per history and per urine/ serum osms, had hypotonic hyponatremia which was the likely trigger for seizures.  What complicates this picture is the concomitant hypokalemia.  She got the 250 bolus of hypertonic and then got a sig amount of potassium supplementation.  When there is severe hypotonic hyponatremia, any solute given- whether Na or K- will cause a free water diuresis.  So the effect was additive, causing overcorrection.                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  EtOH abuse             - on phenobarb taper and CIWA protocol per PCCM  - agree with PCCM- pt will need to remain abstinent from EtOH  4.  Hypokalemia:  - supplementing prn  5.  Dispo: in ICU  Subjective:    Improving.  No complaints today. Na 122--> 119--> 128 this AM.  Overall a net 6 mEq gain in the past 24 hrs.  UOP is way higher again.   Objective:   BP 99/64 (BP Location: Left Arm)   Pulse 61   Temp 98.2 F (36.8 C) (Oral)   Resp 16   Ht 5\' 6"  (1.676 m)   Wt 82.3 kg   SpO2 97%   BMI 29.29 kg/m   Intake/Output Summary (Last 24 hours) at 10/17/2021 1336 Last data filed at 10/17/2021 0643 Gross per 24 hour  Intake 200 ml  Output 2400 ml  Net -2200 ml   Weight change: -0.272 kg  Physical Exam: GEN lying in bed NAD, sleeping, arousable HEENT EOMI PERRL NECK no JVD PULM clear  CV RRR ABD soft, nontender EXT no LE edema NEURO AAO x 3, a little groggy  Imaging: No results found.  Labs: BMET Recent Labs  Lab 10/13/21 0207 10/13/21 0541 10/13/21 1438 10/13/21 1809 10/13/21 2330  10/14/21 0657 10/14/21 1134 10/15/21 0110 10/15/21 0719 10/16/21 0408 10/16/21 0724 10/16/21 1148 10/16/21 1552 10/16/21 1920 10/17/21 0241 10/17/21 0341 10/17/21 0751 10/17/21 1206  NA 116*   < > 124* 126* 131* 132*   < > 124*   < > 123*   < > 131* 129* 128* 122* 121* 121* 122*  K 3.3*   < > 3.5 3.4* 3.0* 3.7  --  3.3*  --  3.8  --   --   --   --   --  3.5  --   --   CL 85*   < > 93* 93* 98 99  --  92*  --  90*  --   --   --   --   --  89*  --   --   CO2 21*   < > 22 25 27 24   --  23  --  24  --   --   --   --   --  23  --   --   GLUCOSE 107*   < > 127* 110* 112* 129*  --  98  --  93  --   --   --   --   --  104*  --   --   BUN 5*   < > <5* <5* <5* <5*  --  <5*  --  <5*  --   --   --   --   --  5*  --   --   CREATININE 0.60   < > 0.77 0.72 0.73 0.71  --  0.67  --  0.72  --   --   --   --   --  0.74  --   --   CALCIUM 7.9*   < > 8.5* 8.6* 8.6* 8.6*  --  8.8*  --  8.9  --   --   --   --   --  8.9  --   --   PHOS 1.8*  --  1.7*  --  2.3*  --   --  3.0  --   --   --   --   --   --   --   --   --   --    < > = values in this interval not displayed.   CBC Recent Labs  Lab 10/12/21 2240 10/13/21 0541 10/14/21 0657 10/15/21 0110 10/16/21 0408  WBC 14.8* 12.9* 8.3 10.3 9.1  NEUTROABS 12.3*  --   --   --   --   HGB 12.6 12.1 11.8* 10.7* 12.3  HCT 34.2* 32.4* 32.3* 30.1* 33.0*  MCV 86.4 85.3 86.8 88.5 85.7  PLT 322 289 265 259 331    Medications:     Chlorhexidine Gluconate Cloth  6 each Topical Daily   desmopressin  10 mcg Nasal BID   enoxaparin (LOVENOX) injection  40 mg Subcutaneous Q24H   folic acid  1 mg Oral Daily   levETIRAcetam  500 mg Oral BID   multivitamin with minerals  1 tablet Oral Daily   phenobarbital  32.4 mg Oral Q8H   thiamine  100 mg Oral Daily    Bufford Buttner MD 10/17/2021, 1:36 PM

## 2021-10-17 NOTE — Progress Notes (Signed)
PROGRESS NOTE  Deborah Lucero  MBW:[id number] DOB: [birth year] DOA: 10/12/2021 PCP: Pcp, No   Brief Narrative: Patient is a 61 year old female with history of chronic alcohol abuse who presented with a seizure on 8/11 at [facility] and was seen at local hospital.  Head CT was negative and was started on Keppra.  History of seizure a year ago from hyponatremia also.  She again had seizure-like episode on 8/12 and was brought to the emergency department.  Patient was encephalopathic on presentation.  CT head did not show any acute abnormalities.  Sodium was found to be 111.  Patient was admitted for the management of hyponatremic induced seizure.  Neurology consulted, admitted to ICU.  Nephrology also following.  Hospital course remarkable for hyponatremia.  Assessment & Plan:  Principal Problem:   Hyponatremia Active Problems:   Alcohol abuse   Seizure (HCC)   Acute encephalopathy  Seizure/acute encephalopathy: Presented with 2-3 episodes of seizure.  History of seizure in the past due to hyponatremia.  Postictal on presentation.  Initially started on CIWA protocol.  Currently not in withdrawal. Currently on Keppra.  We recommend her to follow-up with neurology as an outpatient.  EEG showed mild generalized nonspecific cerebral dysfunction, no seizure epileptiform activity. Currently she is alert and oriented.  Hyponatremia: Suspected to be secondary to beer potomania/decreased fluid intake.  Patient might have been also dehydrated on presentation.  History of chronic hyponatremia.  Nephrology following.  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  Sodium rapidly corrected yesterday so he was started on gentle D5 but sodium level again dropped to 120 today.  Continue to check sodium every 4 hours.  Chronic alcohol abuse: Drinks beer.  Counseled for cessation.  Initially started on CIWA protocol, now on phenobarbital. continue thiamine folic acid.  Depression : On  trazodone, sertraline at home  Electrolyte abnormalities: Continue to monitor and supplement as needed  Debility/deconditioning/weakness: PT/OT following,now follow up recommended         DVT prophylaxis:enoxaparin (LOVENOX) injection 40 mg Start: 10/13/21 1000     Code Status: Full Code  Family Communication: Daughter at bedside  Patient status:Inpatient  Patient is from :Home  Anticipated discharge SV:XBLT  Estimated DC date: Waiting on improvement in sodium level, likely home tomorrow if improvement   Consultants: PCCM, neurology, nephrology  Procedures:EEG  Antimicrobials:  Anti-infectives (From admission, onward)    None       Subjective: Patient seen and examined at the bedside this morning.  Hemodynamically stable, comfortable.  Alert and oriented , denies any complaints.  Eager to go home  Objective: Vitals:   10/16/21 1048 10/16/21 2015 10/17/21 0445 10/17/21 0500  BP: (!) 98/58 (!) 99/56 99/64   Pulse: 63 70 61   Resp: 15 16    Temp:  97.7 F (36.5 C) 98.2 F (36.8 C)   TempSrc:  Oral Oral   SpO2:  96% 97%   Weight:    82.3 kg  Height:        Intake/Output Summary (Last 24 hours) at 10/17/2021 1120 Last data filed at 10/17/2021 0643 Gross per 24 hour  Intake 200 ml  Output 2400 ml  Net -2200 ml   Filed Weights   10/15/21 0454 10/16/21 0421 10/17/21 0500  Weight: 84.5 kg 82.6 kg 82.3 kg    Examination:  General exam: Overall comfortable, not in distress HEENT: PERRL Respiratory system:  no wheezes or crackles  Cardiovascular system: S1 & S2 heard, RRR.  Gastrointestinal system: Abdomen  is nondistended, soft and nontender. Central nervous system: Alert and oriented Extremities: No edema, no clubbing ,no cyanosis Skin: No rashes, no ulcers,no icterus     Data Reviewed: I have personally reviewed following labs and imaging studies  CBC: Recent Labs  Lab 10/12/21 2240 10/13/21 0541 10/14/21 0657 10/15/21 0110 10/16/21 0408   WBC 14.8* 12.9* 8.3 10.3 9.1  NEUTROABS 12.3*  --   --   --   --   HGB 12.6 12.1 11.8* 10.7* 12.3  HCT 34.2* 32.4* 32.3* 30.1* 33.0*  MCV 86.4 85.3 86.8 88.5 85.7  PLT 322 289 265 259 331   Basic Metabolic Panel: Recent Labs  Lab 10/12/21 2240 10/13/21 0207 10/13/21 0541 10/13/21 1021 10/13/21 1438 10/13/21 1809 10/13/21 2330 10/14/21 0657 10/14/21 1134 10/15/21 0110 10/15/21 0719 10/16/21 0408 10/16/21 0724 10/16/21 1552 10/16/21 1920 10/17/21 0241 10/17/21 0341 10/17/21 0751  NA 111* 116*   < > 124* 124*   < > 131* 132*   < > 124*   < > 123*   < > 129* 128* 122* 121* 121*  K 2.9* 3.3*   < > 3.4* 3.5   < > 3.0* 3.7  --  3.3*  --  3.8  --   --   --   --  3.5  --   CL 79* 85*   < > 93* 93*   < > 98 99  --  92*  --  90*  --   --   --   --  89*  --   CO2 17* 21*   < > 23 22   < > 27 24  --  23  --  24  --   --   --   --  23  --   GLUCOSE 131* 107*   < > 111* 127*   < > 112* 129*  --  98  --  93  --   --   --   --  104*  --   BUN 6 5*   < > <5* <5*   < > <5* <5*  --  <5*  --  <5*  --   --   --   --  5*  --   CREATININE 0.65 0.60   < > 0.56 0.77   < > 0.73 0.71  --  0.67  --  0.72  --   --   --   --  0.74  --   CALCIUM 8.5* 7.9*   < > 8.5* 8.5*   < > 8.6* 8.6*  --  8.8*  --  8.9  --   --   --   --  8.9  --   MG 1.2*  --   --  2.0  --   --   --  2.0  --  1.6*  --   --   --   --   --   --   --   --   PHOS  --  1.8*  --   --  1.7*  --  2.3*  --   --  3.0  --   --   --   --   --   --   --   --    < > = values in this interval not displayed.     Recent Results (from the past 240 hour(s))  MRSA Next Gen by PCR, Nasal     Status: None   Collection  Time: 10/13/21  3:01 AM   Specimen: Nasal Mucosa; Nasal Swab  Result Value Ref Range Status   MRSA by PCR Next Gen NOT DETECTED NOT DETECTED Final    Comment: (NOTE) The GeneXpert MRSA Assay (FDA approved for NASAL specimens only), is one component of a comprehensive MRSA colonization surveillance program. It is not intended to  diagnose MRSA infection nor to guide or monitor treatment for MRSA infections. Test performance is not FDA approved in patients less than 56 years old. Performed at [facility], [address], [place], [place] [address]      Radiology Studies: No results found.  Scheduled Meds:  Chlorhexidine Gluconate Cloth  6 each Topical Daily   desmopressin  10 mcg Nasal BID   enoxaparin (LOVENOX) injection  40 mg Subcutaneous Q24H   folic acid  1 mg Oral Daily   levETIRAcetam  500 mg Oral BID   multivitamin with minerals  1 tablet Oral Daily   phenobarbital  32.4 mg Oral Q8H   thiamine  100 mg Oral Daily   Continuous Infusions:  sodium chloride Stopped (10/15/21 0442)     LOS: 4 days   Burnadette Pop, MD [facility] P8/17/2023, 11:20 AM

## 2021-10-18 ENCOUNTER — Other Ambulatory Visit (HOSPITAL_COMMUNITY): Payer: Self-pay

## 2021-10-18 DIAGNOSIS — E871 Hypo-osmolality and hyponatremia: Secondary | ICD-10-CM | POA: Diagnosis not present

## 2021-10-18 LAB — BASIC METABOLIC PANEL
Anion gap: 9 (ref 5–15)
BUN: 5 mg/dL — ABNORMAL LOW (ref 6–20)
CO2: 24 mmol/L (ref 22–32)
Calcium: 9.3 mg/dL (ref 8.9–10.3)
Chloride: 92 mmol/L — ABNORMAL LOW (ref 98–111)
Creatinine, Ser: 0.69 mg/dL (ref 0.44–1.00)
GFR, Estimated: >60 mL/min (ref >60)
Glucose, Bld: 103 mg/dL — ABNORMAL HIGH (ref 70–99)
Potassium: 3.7 mmol/L (ref 3.5–5.1)
Sodium: 125 mmol/L — ABNORMAL LOW (ref 135–145)

## 2021-10-18 LAB — SODIUM
Sodium: 123 mmol/L — ABNORMAL LOW (ref 135–145)
Sodium: 129 mmol/L — ABNORMAL LOW (ref 135–145)

## 2021-10-18 MED ORDER — FOLIC ACID 1 MG PO TABS
1.0000 mg | ORAL_TABLET | Freq: Every day | ORAL | 1 refills | Status: AC
  Filled 2021-10-18: qty 30, 30d supply, fill #0

## 2021-10-18 MED ORDER — DESMOPRESSIN ACE SPRAY REFRIG 0.01 % NA SOLN
1.0000 | Freq: Every day | NASAL | Status: DC
Start: 2021-10-18 — End: 2021-10-18
  Filled 2021-10-18: qty 5

## 2021-10-18 MED ORDER — DESMOPRESSIN ACETATE SPRAY 0.01 % NA SOLN
10.0000 ug | Freq: Every day | NASAL | Status: DC
  Filled 2021-10-18: qty 0.1

## 2021-10-18 MED ORDER — DESMOPRESSIN ACETATE SPRAY 0.01 % NA SOLN
10.0000 ug | Freq: Every day | NASAL | 0 refills | Status: DC
  Filled 2021-10-18: qty 5, 50d supply, fill #0

## 2021-10-18 MED ORDER — LEVETIRACETAM 500 MG PO TABS
500.0000 mg | ORAL_TABLET | Freq: Two times a day (BID) | ORAL | 1 refills | Status: DC
  Filled 2021-10-18: qty 60, 30d supply, fill #0

## 2021-10-18 MED ORDER — DESMOPRESSIN ACETATE SPRAY 0.01 % NA SOLN
10.0000 ug | Freq: Two times a day (BID) | NASAL | Status: DC
Start: 2021-10-18 — End: 2021-10-18
  Administered 2021-10-18: 10 ug via NASAL
  Filled 2021-10-18 (×2): qty 0.1

## 2021-10-18 MED ORDER — THIAMINE HCL 100 MG PO TABS
100.0000 mg | ORAL_TABLET | Freq: Every day | ORAL | 1 refills | Status: DC
  Filled 2021-10-18: qty 30, 30d supply, fill #0

## 2021-10-18 NOTE — Progress Notes (Signed)
[facility] Progress Note   Assessment/ Plan:    Hyponatremia:  per history and per urine/ serum osms, had hypotonic hyponatremia which was the likely trigger for seizures.  What complicates this picture is the concomitant hypokalemia.  She got the 250 bolus of hypertonic and then got a sig amount of potassium supplementation.  When there is severe hypotonic hyponatremia, any solute given- whether Na or K- will cause a free water diuresis.  So the effect was additive, causing overcorrection.                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  EtOH abuse             - on phenobarb taper and CIWA protocol per PCCM  - agree with PCCM- pt will need to remain abstinent from EtOH, discussed this with her and dtr today  4.  Hypokalemia:  - supplementing prn  5.  Depression:  Pt says she was on wellbutrin as OP- inquiring about weaning off it- discussed that this is probably a good idea with the seizures  6.  Dispo: ok for d/c today from my perspective   Subjective:    Seen in room.  Na finally rising appropriately on DDAVP 1 spray daily.  Eager to leave hospital.   Objective:   BP 109/66 (BP Location: Right Arm)   Pulse 63   Temp 98.4 F (36.9 C) (Oral)   Resp 18   Ht 5\' 6"  (1.676 m)   Wt 79.6 kg   SpO2 96%   BMI 28.32 kg/m   Intake/Output Summary (Last 24 hours) at 10/18/2021 1131 Last data filed at 10/17/2021 2200 Gross per 24 hour  Intake 480 ml  Output --  Net 480 ml   Weight change: -2.728 kg  Physical Exam: GEN lying in bed NAD, eating breakfast HEENT EOMI PERRL NECK no JVD PULM  clear  CV RRR ABD soft, nontender EXT no LE edema NEURO AAO x 3, a little groggy  Imaging: No results found.  Labs: BMET Recent Labs  Lab 10/13/21 0207 10/13/21 0541 10/13/21 1438 10/13/21 1809 10/13/21 2330 10/14/21 0657 10/14/21 1134 10/15/21 0110 10/15/21 0719 10/16/21 0408 10/16/21 0724 10/17/21 0341 10/17/21 0751 10/17/21 1206 10/17/21 1603 10/17/21 1953 10/18/21 0031 10/18/21 0426 10/18/21 0813  NA 116*   < > 124* 126* 131* 132*   < > 124*   < > 123*   < > 121* 121* 122* 120* 122* 123* 125* 129*  K 3.3*   < > 3.5 3.4* 3.0* 3.7  --  3.3*  --  3.8  --  3.5  --   --   --   --   --  3.7  --   CL 85*   < > 93* 93* 98 99  --  92*  --  90*  --  89*  --   --   --   --   --  92*  --   CO2 21*   < > 22 25 27 24   --  23  --  24  --  23  --   --   --   --   --  24  --   GLUCOSE 107*   < > 127* 110* 112* 129*  --  98  --  93  --  104*  --   --   --   --   --  103*  --   BUN 5*   < > <5* <5* <5* <5*  --  <5*  --  <5*  --  5*  --   --   --   --   --  5*  --   CREATININE 0.60   < > 0.77 0.72 0.73 0.71  --  0.67  --  0.72  --  0.74  --   --   --   --   --  0.69  --   CALCIUM 7.9*   < > 8.5* 8.6* 8.6* 8.6*  --  8.8*  --  8.9  --  8.9  --   --   --   --   --  9.3  --   PHOS 1.8*  --  1.7*  --  2.3*  --   --  3.0  --   --   --   --   --   --   --   --   --   --   --    < > = values in this interval not displayed.   CBC Recent Labs  Lab 10/12/21 2240 10/13/21 0541 10/14/21 0657 10/15/21 0110 10/16/21 0408  WBC 14.8* 12.9* 8.3 10.3 9.1  NEUTROABS 12.3*  --   --   --   --   HGB 12.6 12.1 11.8* 10.7* 12.3  HCT 34.2* 32.4* 32.3* 30.1* 33.0*  MCV 86.4 85.3 86.8 88.5 85.7  PLT 322 289 265 259 331    Medications:     [START ON 10/19/2021] desmopressin  10 mcg Nasal Daily   enoxaparin (LOVENOX) injection  40 mg Subcutaneous Q24H   folic acid  1 mg Oral Daily   levETIRAcetam  500 mg Oral BID   multivitamin with minerals  1 tablet Oral Daily   thiamine  100 mg Oral Daily     10/21/2021 MD 10/18/2021, 11:31 AM

## 2021-10-18 NOTE — Plan of Care (Signed)

## 2021-10-18 NOTE — Discharge Summary (Signed)
Physician Discharge Summary  Deborah Lucero [id number] DOB: [birth year] DOA: 10/12/2021  PCP: Pcp, No  Admit date: 10/12/2021 Discharge date: 10/18/2021  Admitted From: Home Disposition:  Home  Discharge Condition:Stable CODE STATUS:FULL Diet recommendation: Regular   Brief/Interim Summary: Patient is a 61 year old female with history of chronic alcohol abuse who presented with a seizure on 8/11 at [facility] and was seen at local hospital.  Head CT was negative and was started on Keppra.  History of seizure a year ago from hyponatremia also.  She again had seizure-like episode on 8/12 and was brought to the emergency department.  Patient was encephalopathic on presentation.  CT head did not show any acute abnormalities.  Sodium was found to be 111.  Patient was admitted for the management of hyponatremic induced seizure.  Neurology consulted, admitted to ICU.  Hospital course remarkable for persistent hyponatremia, nephrology was consulted.  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  Started on nasal DDAVP.  Sodium level has improved to 129 today.  Patient has been cleared by nephrology for discharge.  She will be followed by nephrology in a week.  Following problems were addressed during her hospitalization:  Seizure/acute encephalopathy: Presented with 2-3 episodes of seizure.  History of seizure in the past due to hyponatremia.  Postictal on presentation.  Initially started on CIWA protocol.  Currently not in withdrawal. Currently on Keppra.  We recommend her to follow-up with neurology as an outpatient.  EEG showed mild generalized nonspecific cerebral dysfunction, no seizure epileptiform activity. Currently she is alert and oriented.   Hyponatremia: Initially  to be secondary to beer potomania/decreased fluid intake.Marland Kitchen  History of chronic hyponatremia.  Nephrology following.  Started on DDAVP for suspicion of concurrent central  diabetes insipidus.  Sodium  stable at 129 today.  She will be discharged with plan for follow-up with nephrology in a week   Chronic alcohol abuse: Drinks beer.  Counseled for cessation. continue thiamine folic acid.   Depression : On trazodone, sertraline at home  Debility/deconditioning/weakness: PT/OT following,no follow up recommended      Discharge Diagnoses:  Principal Problem:   Hyponatremia Active Problems:   Alcohol abuse   Seizure [facility])   Acute encephalopathy    Discharge Instructions  Discharge Instructions     Ambulatory referral to Neurology   Complete by: As directed    An appointment is requested in approximately: 4 weeks   Diet - low sodium heart healthy   Complete by: As directed    Discharge instructions   Complete by: As directed    1)Take prescribed medications as instructed 2)Follow up with neurology as an outpatient.  Name and number the provider group has been attached 3)You will be called by nephrology for follow-up appointment.   Increase activity slowly   Complete by: As directed       Allergies as of 10/18/2021       Reactions   Hydrocodone Other (See Comments)   Syncope         Medication List     STOP taking these medications    APPLE CIDER VINEGAR PO   DIUREX PO       TAKE these medications    desmopressin 0.01 % solution Commonly known as: DDAVP NASAL Place 1 spray (10 mcg total) into the nose daily. Start taking on: August 19, 99991111   folic acid 1 MG tablet Commonly known as: FOLVITE Take 1 tablet (1 mg total) by mouth daily. Start taking on: October 19, 2021   ibuprofen 200 MG tablet Commonly known as: ADVIL Take 200-600 mg by mouth daily as needed for headache or mild pain.   levETIRAcetam 500 MG tablet Commonly known as: KEPPRA Take 1 tablet (500 mg total) by mouth 2 (two) times daily.   multivitamin tablet Take 1 tablet by mouth daily.   naproxen 500 MG tablet Commonly known as: NAPROSYN Take 500 mg by mouth 2 (two) times  daily as needed for pain.   pantoprazole 40 MG tablet Commonly known as: PROTONIX Take 40 mg by mouth daily.   sertraline 100 MG tablet Commonly known as: ZOLOFT Take 100 mg by mouth daily.   thiamine 100 MG tablet Commonly known as: Vitamin B-1 Take 1 tablet (100 mg total) by mouth daily. Start taking on: October 19, 2021   traZODone 50 MG tablet Commonly known as: DESYREL Take 50 mg by mouth at bedtime.        Allergies  Allergen Reactions   Hydrocodone Other (See Comments)    Syncope     Consultations: Nephrology,neurology   Procedures/Studies: DG CHEST PORT 1 VIEW  Result Date: 10/13/2021 CLINICAL DATA:  Cough EXAM: PORTABLE CHEST 1 VIEW COMPARISON:  11/02/2021 FINDINGS: The heart size and mediastinal contours are within normal limits. Both lungs are clear. The visualized skeletal structures are unremarkable. IMPRESSION: No active disease. Electronically Signed   By: Ernie Avena M.D.   On: 10/13/2021 13:54   EEG adult  Result Date: 10/13/2021 Rejeana Brock, MD     10/13/2021  8:55 AM History: 61 yo F being evaluated after seizure Sedation: None Technique: This EEG was acquired with electrodes placed according to the International 10-20 electrode system (including Fp1, Fp2, F3, F4, C3, C4, P3, P4, O1, O2, T3, T4, T5, T6, A1, A2, Fz, Cz, Pz). The following electrodes were missing or displaced: none. Background: The majority of this EEG was recorded during sleep with bilaterally symmetric appearing sleep structures.  During a brief period of arousal, there is a posterior dominant rhythm of 8 to 9 Hz, as well as generalized irregular slow activities admixed in the background. Photic stimulation: Physiologic driving is not performed EEG Abnormalities: 1) generalized irregular slow activity Clinical Interpretation: This EEG is consistent with a mild generalized nonspecific cerebral dysfunction (encephalopathy). There was no seizure or seizure predisposition  recorded on this study. Please note that lack of epileptiform activity on EEG does not preclude the possibility of epilepsy. Ritta Slot, MD [facility] [phone] If 7pm- 7am, please page neurology on call as listed in AMION.   CT Head Wo Contrast  Result Date: 10/13/2021 CLINICAL DATA:  New onset seizures.  No trauma history. EXAM: CT HEAD WITHOUT CONTRAST TECHNIQUE: Contiguous axial images were obtained from the base of the skull through the vertex without intravenous contrast. RADIATION DOSE REDUCTION: This exam was performed according to the departmental dose-optimization program which includes automated exposure control, adjustment of the mA and/or kV according to patient size and/or use of iterative reconstruction technique. COMPARISON:  Head CT 11/02/2020 FINDINGS: Brain: No evidence of acute infarction, hemorrhage, hydrocephalus, extra-axial collection or mass lesion/mass effect. Vascular: No hyperdense vessel or unexpected calcification. There are scattered calcifications in the carotid siphons. Skull: Negative for fracture or focal lesion. Sinuses/Orbits: No acute findings. Other: Visualized mastoid air cells and middle ears are clear. IMPRESSION: No acute intracranial CT findings or interval changes. Scattered carotid atherosclerosis. Electronically Signed   By: Almira Bar M.D.   On: 10/13/2021 00:52  Subjective: Patient seen and examined the bedside this morning.  Very eager to go home.  Alert and oriented.  Hemodynamically stable for discharge  Discharge Exam: Vitals:   10/18/21 0330 10/18/21 0815  BP: (!) 111/56 109/66  Pulse: 64 63  Resp: 18   Temp:    SpO2: 96% 96%   Vitals:   10/17/21 2015 10/18/21 0330 10/18/21 0444 10/18/21 0815  BP: 117/68 (!) 111/56  109/66  Pulse: 66 64  63  Resp: 16 18    Temp: 98.4 F (36.9 C)     TempSrc: Oral     SpO2:  96%  96%  Weight:   79.6 kg   Height:        General: Pt is alert, awake, not in acute  distress Cardiovascular: RRR, S1/S2 +, no rubs, no gallops Respiratory: CTA bilaterally, no wheezing, no rhonchi Abdominal: Soft, NT, ND, bowel sounds + Extremities: no edema, no cyanosis    The results of significant diagnostics from this hospitalization (including imaging, microbiology, ancillary and laboratory) are listed below for reference.     Microbiology: Recent Results (from the past 240 hour(s))  MRSA Next Gen by PCR, Nasal     Status: None   Collection Time: 10/13/21  3:01 AM   Specimen: Nasal Mucosa; Nasal Swab  Result Value Ref Range Status   MRSA by PCR Next Gen NOT DETECTED NOT DETECTED Final    Comment: (NOTE) The GeneXpert MRSA Assay (FDA approved for NASAL specimens only), is one component of a comprehensive MRSA colonization surveillance program. It is not intended to diagnose MRSA infection nor to guide or monitor treatment for MRSA infections. Test performance is not FDA approved in patients less than 48 years old. Performed at [facility], [place] [address] [place], [place] [address]      Labs: BNP (last 3 results) No results for input(s): "BNP" in the last 8760 hours. Basic Metabolic Panel: Recent Labs  Lab 10/12/21 2240 10/13/21 0207 10/13/21 0541 10/13/21 1021 10/13/21 1438 10/13/21 1809 10/13/21 2330 10/14/21 0657 10/14/21 1134 10/15/21 0110 10/15/21 0719 10/16/21 0408 10/16/21 0724 10/17/21 0341 10/17/21 0751 10/17/21 1603 10/17/21 1953 10/18/21 0031 10/18/21 0426 10/18/21 0813  NA 111* 116*   < > 124* 124*   < > 131* 132*   < > 124*   < > 123*   < > 121*   < > 120* 122* 123* 125* 129*  K 2.9* 3.3*   < > 3.4* 3.5   < > 3.0* 3.7  --  3.3*  --  3.8  --  3.5  --   --   --   --  3.7  --   CL 79* 85*   < > 93* 93*   < > 98 99  --  92*  --  90*  --  89*  --   --   --   --  92*  --   CO2 17* 21*   < > 23 22   < > 27 24  --  23  --  24  --  23  --   --   --   --  24  --   GLUCOSE 131* 107*   < > 111* 127*   < > 112* 129*  --  98  --   93  --  104*  --   --   --   --  103*  --   BUN 6 5*   < > <  5* <5*   < > <5* <5*  --  <5*  --  <5*  --  5*  --   --   --   --  5*  --   CREATININE 0.65 0.60   < > 0.56 0.77   < > 0.73 0.71  --  0.67  --  0.72  --  0.74  --   --   --   --  0.69  --   CALCIUM 8.5* 7.9*   < > 8.5* 8.5*   < > 8.6* 8.6*  --  8.8*  --  8.9  --  8.9  --   --   --   --  9.3  --   MG 1.2*  --   --  2.0  --   --   --  2.0  --  1.6*  --   --   --   --   --   --   --   --   --   --   PHOS  --  1.8*  --   --  1.7*  --  2.3*  --   --  3.0  --   --   --   --   --   --   --   --   --   --    < > = values in this interval not displayed.   Liver Function Tests: Recent Labs  Lab 10/12/21 2240 10/13/21 0541  AST 42* 32  ALT 20 18  ALKPHOS 54 49  BILITOT 0.7 0.9  PROT 5.4* 5.3*  ALBUMIN 3.6 3.3*   No results for input(s): "LIPASE", "AMYLASE" in the last 168 hours. No results for input(s): "AMMONIA" in the last 168 hours. CBC: Recent Labs  Lab 10/12/21 2240 10/13/21 0541 10/14/21 0657 10/15/21 0110 10/16/21 0408  WBC 14.8* 12.9* 8.3 10.3 9.1  NEUTROABS 12.3*  --   --   --   --   HGB 12.6 12.1 11.8* 10.7* 12.3  HCT 34.2* 32.4* 32.3* 30.1* 33.0*  MCV 86.4 85.3 86.8 88.5 85.7  PLT 322 289 265 259 331   Cardiac Enzymes: Recent Labs  Lab 10/13/21 0207  CKTOTAL 654*   BNP: Invalid input(s): "POCBNP" CBG: Recent Labs  Lab 10/14/21 1920 10/14/21 2334 10/15/21 0316 10/15/21 1158 10/15/21 1606  GLUCAP 110* 108* 105* 95 105*   D-Dimer No results for input(s): "DDIMER" in the last 72 hours. Hgb A1c No results for input(s): "HGBA1C" in the last 72 hours. Lipid Profile No results for input(s): "CHOL", "HDL", "LDLCALC", "TRIG", "CHOLHDL", "LDLDIRECT" in the last 72 hours. Thyroid function studies No results for input(s): "TSH", "T4TOTAL", "T3FREE", "THYROIDAB" in the last 72 hours.  Invalid input(s): "FREET3" Anemia work up No results for input(s): "VITAMINB12", "FOLATE", "FERRITIN", "TIBC", "IRON",  "RETICCTPCT" in the last 72 hours. Urinalysis    Component Value Date/Time   COLORURINE YELLOW 10/13/2021 0345   APPEARANCEUR HAZY (A) 10/13/2021 0345   LABSPEC 1.006 10/13/2021 0345   PHURINE 6.0 10/13/2021 0345   GLUCOSEU NEGATIVE 10/13/2021 0345   HGBUR SMALL (A) 10/13/2021 0345   BILIRUBINUR NEGATIVE 10/13/2021 0345   KETONESUR 20 (A) 10/13/2021 0345   PROTEINUR NEGATIVE 10/13/2021 0345   NITRITE NEGATIVE 10/13/2021 0345   LEUKOCYTESUR NEGATIVE 10/13/2021 0345   Sepsis Labs Recent Labs  Lab 10/13/21 0541 10/14/21 0657 10/15/21 0110 10/16/21 0408  WBC 12.9* 8.3 10.3 9.1   Microbiology Recent Results (from the past 240 hour(s))  MRSA  Next Gen by PCR, Nasal     Status: None   Collection Time: 10/13/21  3:01 AM   Specimen: Nasal Mucosa; Nasal Swab  Result Value Ref Range Status   MRSA by PCR Next Gen NOT DETECTED NOT DETECTED Final    Comment: (NOTE) The GeneXpert MRSA Assay (FDA approved for NASAL specimens only), is one component of a comprehensive MRSA colonization surveillance program. It is not intended to diagnose MRSA infection nor to guide or monitor treatment for MRSA infections. Test performance is not FDA approved in patients less than 2 years old. Performed at [facility], [address], [place], [place] [address]     Please note: You were cared for by a hospitalist during your hospital stay. Once you are discharged, your primary care physician will handle any further medical issues. Please note that NO REFILLS for any discharge medications will be authorized once you are discharged, as it is imperative that you return to your primary care physician (or establish a relationship with a primary care physician if you do not have one) for your post hospital discharge needs so that they can reassess your need for medications and monitor your lab values.    Time coordinating discharge: 40 minutes  SIGNED:   Burnadette Pop, MD  [facility] 10/18/2021, 12:24 PM Pager [phone]  If 7PM-7AM, please contact night-coverage [email or web address] Password [email or web address]

## 2021-10-21 ENCOUNTER — Encounter: Payer: Self-pay | Admitting: Orthopaedic Surgery

## 2021-10-24 ENCOUNTER — Other Ambulatory Visit: Payer: Self-pay | Admitting: Adult Health

## 2021-11-08 ENCOUNTER — Other Ambulatory Visit (HOSPITAL_COMMUNITY): Payer: Self-pay

## 2021-11-15 ENCOUNTER — Ambulatory Visit (INDEPENDENT_AMBULATORY_CARE_PROVIDER_SITE_OTHER): Payer: BC Managed Care – PPO | Admitting: Neurology

## 2021-11-15 ENCOUNTER — Encounter: Payer: Self-pay | Admitting: Neurology

## 2021-11-15 VITALS — BP 124/71 | HR 78

## 2021-11-15 DIAGNOSIS — Z5181 Encounter for therapeutic drug level monitoring: Secondary | ICD-10-CM | POA: Diagnosis not present

## 2021-11-15 DIAGNOSIS — E871 Hypo-osmolality and hyponatremia: Secondary | ICD-10-CM

## 2021-11-15 DIAGNOSIS — G40909 Epilepsy, unspecified, not intractable, without status epilepticus: Secondary | ICD-10-CM

## 2021-11-15 MED ORDER — LEVETIRACETAM 500 MG PO TABS: 500.0000 mg | ORAL_TABLET | Freq: Two times a day (BID) | ORAL | 11 refills | Status: DC

## 2021-11-15 NOTE — Progress Notes (Signed)
[facility]  PATIENT: Deborah Lucero DOB: 1961/01/13  REQUESTING CLINICIAN: Burnadette Pop, MD HISTORY FROM: Patient and daughter  REASON FOR VISIT: Seizure disorder   HISTORICAL  CHIEF COMPLAINT:  Chief Complaint  Patient presents with   New Patient (Initial Visit)    Pt in 13 Pt here for seizures.   Pt had seizure last month was in hospital for a week Doesn't remember what happened. Pt states bit tongue     HISTORY OF PRESENT ILLNESS:  This is a 61 year old woman past medical history of anxiety/depression and seizure who is presenting after being admitted recently for seizure in the setting of hyponatremia.  Per daughter patient had a generalized tonic-clonic seizures on August 12 which required her to call EMS.  She believes that patient may have a second seizure on the way to the hospital.  In the hospital she was noted to have hyponatremia down to 111.  Seizure was thought to be secondary to hyponatremia.  Patient was seen by nephrology, started on desmopressin and fluid restriction with improvement of the sodium. Her last sodium level was 128. Patient also had a similar presentation last year in September when she had a seizure and was found to be hyponatremic to 124.  At that time she was not started on medication but on this last hospitalization she was started on Keppra 500 mg twice daily.  She reports compliance with the medication, denies any side effect.  Prior to hospitalization she was on Wellbutrin but upon discharge Wellbutrin was not restarted so there is some concern for mood and irritability.  She denies any other seizure risk factor except history of alcohol abuse. She reports drinking a12 pack of beer in a week but since discharge from the hospital she has not drink alcohol.     Handedness: Right handed   Onset: August 12 but had her first seizure November 02 2020  Seizure Type: Generalized seizure with tongue biting   Current frequency:  She had 2 seizures, last one being on August 12  Any injuries from seizures: Tongue biting   Seizure risk factors: Hyponatremia, mother has seizure from encephalitis  Previous ASMs: None   Currenty ASMs: Keppra   ASMs side effects: Questionable Irritability (She was on Wellbutrin but after recent seizures, meds was discontinued)   Brain Images: Normal head CT   Previous EEGs: Diffuse slowing    Hospital course and summary  Patient is a 61 year old female with history of chronic alcohol abuse who presented with a seizure on 8/11 at [facility] and was seen at local hospital.  Head CT was negative and was started on Keppra.  History of seizure a year ago from hyponatremia also.  She again had seizure-like episode on 8/12 and was brought to the emergency department.  Patient was encephalopathic on presentation.  CT head did not show any acute abnormalities.  Sodium was found to be 111.  Patient was admitted for the management of hyponatremic induced seizure.  Neurology consulted, admitted to ICU.  Hospital course remarkable for persistent hyponatremia, nephrology was consulted.  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  Started on nasal DDAVP.  Sodium level has improved to 129 today.  Patient has been cleared by nephrology for discharge.  She will be followed by nephrology in a week.   Seizure/acute encephalopathy: Presented with 2-3 episodes of seizure.  History of seizure in the past due to hyponatremia.  Postictal on presentation.  Initially started on CIWA protocol.  Currently not in withdrawal. Currently on Keppra.  We recommend her to follow-up with neurology as an outpatient.  EEG showed mild generalized nonspecific cerebral dysfunction, no seizure epileptiform activity. Currently she is alert and oriented.   Hyponatremia: Initially  to be secondary to beer potomania/decreased fluid intake.Marland Kitchen  History of chronic hyponatremia.  Nephrology following.  Started on  DDAVP for suspicion of concurrent central  diabetes insipidus.  Sodium stable at 129 today.  She will be discharged with plan for follow-up with nephrology in a week   Chronic alcohol abuse: Drinks beer.  Counseled for cessation. continue thiamine folic acid.   OTHER MEDICAL CONDITIONS: Anxiety/Depression, seizure  REVIEW OF SYSTEMS: Full 14 system review of systems performed and negative with exception of: As noted in the HPI   ALLERGIES: Allergies  Allergen Reactions   Codeine Other (See Comments)    Hallucinations   Hydrocodone Other (See Comments)    Syncope     HOME MEDICATIONS: Outpatient Medications Prior to Visit  Medication Sig Dispense Refill   albuterol (VENTOLIN HFA) 108 (90 Base) MCG/ACT inhaler Inhale 1-2 puffs into the lungs every 6 (six) hours as needed for wheezing or shortness of breath. 18 g 0   cyclobenzaprine (FLEXERIL) 10 MG tablet Take 1 tablet (10 mg total) by mouth at bedtime. One tablet every night at bedtime as needed for spasm. 30 tablet 0   desmopressin (DDAVP NASAL) 0.01 % solution Place 1 spray (10 mcg total) into the nose daily. 5 mL 0   folic acid (FOLVITE) 1 MG tablet Take 1 tablet (1 mg total) by mouth daily. 30 tablet 1   Ibuprofen (ADVIL PO) Take by mouth as needed.      ibuprofen (ADVIL) 200 MG tablet Take 200-600 mg by mouth daily as needed for headache or mild pain.     Multiple Vitamin (MULTIVITAMIN) tablet Take 1 tablet by mouth daily.     NYSTATIN powder APPLY TOPICALLY THREE TIMES DAILY (Patient taking differently: Apply 1 application  topically as needed.) 45 g 1   pantoprazole (PROTONIX) 40 MG tablet Take 40 mg by mouth daily.     pseudoephedrine (SUDAFED) 120 MG 12 hr tablet Take 120 mg by mouth as needed.     levETIRAcetam (KEPPRA) 500 MG tablet Take 1 tablet (500 mg total) by mouth 2 (two) times daily. 60 tablet 1   naproxen (NAPROSYN) 500 MG tablet Take 1 tablet (500 mg total) by mouth 2 (two) times daily with a meal. 60 tablet 5    naproxen (NAPROSYN) 500 MG tablet Take 500 mg by mouth 2 (two) times daily as needed for pain.     pantoprazole (PROTONIX) 40 MG tablet Take 40 mg by mouth daily. (Patient not taking: Reported on 11/15/2021)     predniSONE (DELTASONE) 20 MG tablet Take 2 tablets (40 mg total) by mouth daily with breakfast. 6 tablet 0   promethazine-dextromethorphan (PROMETHAZINE-DM) 6.25-15 MG/5ML syrup Take 5 mLs by mouth 4 (four) times daily as needed. 100 mL 0   sertraline (ZOLOFT) 100 MG tablet Take 100 mg by mouth daily.     sertraline (ZOLOFT) 25 MG tablet Take 25 mg by mouth once.     thiamine (VITAMIN B1) 100 MG tablet Take 1 tablet (100 mg total) by mouth daily. 30 tablet 1   traZODone (DESYREL) 50 MG tablet TAKE 1 TABLET(50 MG) BY MOUTH AT BEDTIME AS NEEDED FOR SLEEP 30 tablet 2   No facility-administered medications prior to visit.    PAST MEDICAL  HISTORY: Past Medical History:  Diagnosis Date   Abdominal pain 05/05/2013   Anxiety    Anxiety and depression 10/04/2015   Constipation 05/05/2013   Depression    Hiatal hernia    Insomnia    Mental disorder    PONV (postoperative nausea and vomiting)    Scoliosis    Watery stools 05/05/2013    PAST SURGICAL HISTORY: Past Surgical History:  Procedure Laterality Date   BREAST BIOPSY Right 1986   Benign   BREAST SURGERY     lumpectomy right breast-benign   CESAREAN SECTION  06/24/1999   DIAGNOSTIC LAPAROSCOPY     HYSTEROSCOPY WITH THERMACHOICE  08/14/2011   Procedure: HYSTEROSCOPY WITH THERMACHOICE;  Surgeon: Lazaro Arms, MD;  Location: [facility];  Service: Gynecology;;  Thermachoice Endometrial Ablation Total Therapy Time=57min 44sec    FAMILY HISTORY: Family History  Problem Relation Age of Onset   Alzheimer's disease Father    Alzheimer's disease Sister    Seizures Neg Hx     SOCIAL HISTORY: Social History   Socioeconomic History   Marital status: Single    Spouse name: Not on file   Number of children: Not on file   Years of  education: Not on file   Highest education level: Not on file  Occupational History   Not on file  Tobacco Use   Smoking status: Former    Types: Cigarettes   Smokeless tobacco: Never  Vaping Use   Vaping Use: Never used  Substance and Sexual Activity   Alcohol use: Yes    Alcohol/week: 6.0 standard drinks of alcohol    Types: 6 Cans of beer per week    Comment: seltzer/white claw   Drug use: Not Currently   Sexual activity: Not Currently    Birth control/protection: Surgical, Post-menopausal    Comment: ablation  Other Topics Concern   Not on file  Social History Narrative   ** Merged History Encounter **       Social Determinants of Health   Financial Resource Strain: Not on file  Food Insecurity: Not on file  Transportation Needs: Not on file  Physical Activity: Not on file  Stress: Not on file  Social Connections: Not on file  Intimate Partner Violence: Not on file    PHYSICAL EXAM  GENERAL EXAM/CONSTITUTIONAL: Vitals:  Vitals:   11/15/21 1113  BP: 124/71  Pulse: 78   There is no height or weight on file to calculate BMI. Wt Readings from Last 3 Encounters:  10/18/21 175 lb 7.8 oz (79.6 kg)  04/25/21 169 lb (76.7 kg)  11/02/20 149 lb 14.6 oz (68 kg)   Patient is in no distress; well developed, nourished and groomed; neck is supple  EYES: Pupils round and reactive to light, Visual fields full to confrontation, Extraocular movements intacts,  No results found.  MUSCULOSKELETAL: Gait, strength, tone, movements noted in Neurologic exam below  NEUROLOGIC: MENTAL STATUS:      No data to display         awake, alert, oriented to person, place and time recent and remote memory intact normal attention and concentration language fluent, comprehension intact, naming intact fund of knowledge appropriate  CRANIAL NERVE:  2nd, 3rd, 4th, 6th - pupils equal and reactive to light, visual fields full to confrontation, extraocular muscles intact, no  nystagmus 5th - facial sensation symmetric 7th - facial strength symmetric 8th - hearing intact 9th - palate elevates symmetrically, uvula midline 11th - shoulder shrug symmetric 12th - tongue protrusion  midline  MOTOR:  normal bulk and tone, full strength in the BUE, BLE  SENSORY:  normal and symmetric to light touch  COORDINATION:  finger-nose-finger, fine finger movements normal  REFLEXES:  deep tendon reflexes present and symmetric  GAIT/STATION:  normal   DIAGNOSTIC DATA (LABS, IMAGING, TESTING) - I reviewed patient records, labs, notes, testing and imaging myself where available.  Lab Results  Component Value Date   WBC 9.1 10/16/2021   HGB 12.3 10/16/2021   HCT 33.0 (L) 10/16/2021   MCV 85.7 10/16/2021   PLT 331 10/16/2021      Component Value Date/Time   NA 129 (L) 10/18/2021 0813   NA 139 06/27/2019 0819   K 3.7 10/18/2021 0426   CL 92 (L) 10/18/2021 0426   CO2 24 10/18/2021 0426   GLUCOSE 103 (H) 10/18/2021 0426   BUN 5 (L) 10/18/2021 0426   BUN 12 06/27/2019 0819   CREATININE 0.69 10/18/2021 0426   CREATININE 0.85 05/05/2013 1710   CALCIUM 9.3 10/18/2021 0426   PROT 5.3 (L) 10/13/2021 0541   PROT 6.6 06/27/2019 0819   ALBUMIN 3.3 (L) 10/13/2021 0541   ALBUMIN 4.1 06/27/2019 0819   AST 32 10/13/2021 0541   ALT 18 10/13/2021 0541   ALKPHOS 49 10/13/2021 0541   BILITOT 0.9 10/13/2021 0541   BILITOT <0.2 06/27/2019 0819   GFRNONAA >60 10/18/2021 0426   GFRAA 72 06/27/2019 0819   Lab Results  Component Value Date   CHOL 230 (H) 12/31/2018   HDL 44 12/31/2018   LDLCALC 147 (H) 12/31/2018   TRIG 214 (H) 12/31/2018   Lab Results  Component Value Date   HGBA1C 5.0 10/04/2015   No results found for: "VITAMINB12" Lab Results  Component Value Date   TSH 1.513 10/13/2021    MRI Brain 11/16/20 Intermittently motion degraded examination, as described. No evidence of acute intracranial abnormality. No specific seizure focus is identified. A  few small scattered foci of T2/FLAIR hyperintense signal abnormality within the cerebral white matter are nonspecific, but most often secondary to chronic small vessel ischemia.   Mild generalized cerebral atrophy. CT Head 10/13/21 No acute intracranial CT findings or interval changes. Scattered carotid atherosclerosis.   EEG 10/13/21:  This EEG is consistent with a mild generalized nonspecific cerebral dysfunction (encephalopathy). There was no seizure or seizure predisposition recorded on this study. Please note that lack of epileptiform activity on EEG does not preclude the possibility of epilepsy.  I personally reviewed brain Images and previous EEG reports.   ASSESSMENT AND PLAN  61 y.o. year old female  with history of anxiety and depression who is presenting after being recently discharged from the hospital for seizure in the setting of hyponatremia.  Since discharge from the hospital she is compliant with the Keppra, and her sodium has also improved to 128 with fluid restriction and desmopressin.  At this time we will continue Keppra for at least a year, at that time I will obtain a routine EEG and with normalization of her sodium then we will discuss about coming off medication.  This was discussed with the patient and her daughter and they are both comfortable with plans.  Follow-up in 3 months or sooner if worse. Also advised them to contact me if she had a breakthrough seizure or side effect from the Keppra.    1. Seizure disorder (HCC)   2. Therapeutic drug monitoring   3. Hyponatremia     Patient Instructions  Continue with Keppra 500 mg twice  daily We will obtain a Keppra level today Please contact me if you have any breakthrough seizures or any have any side effect from the Keppra Follow-up in 3 months   Per [facility] statutes, patients with seizures are not allowed to drive until they have been seizure-free for six months.  Other recommendations include using  caution when using heavy equipment or power tools. Avoid working on ladders or at heights. Take showers instead of baths.  Do not swim alone.  Ensure the water temperature is not too high on the home water heater. Do not go swimming alone. Do not lock yourself in a room alone (i.e. bathroom). When caring for infants or small children, sit down when holding, feeding, or changing them to minimize risk of injury to the child in the event you have a seizure. Maintain good sleep hygiene. Avoid alcohol.  Also recommend adequate sleep, hydration, good diet and minimize stress.   During the Seizure  - First, ensure adequate ventilation and place patients on the floor on their left side  Loosen clothing around the neck and ensure the airway is patent. If the patient is clenching the teeth, do not force the mouth open with any object as this can cause severe damage - Remove all items from the surrounding that can be hazardous. The patient may be oblivious to what's happening and may not even know what he or she is doing. If the patient is confused and wandering, either gently guide him/her away and block access to outside areas - Reassure the individual and be comforting - Call 911. In most cases, the seizure ends before EMS arrives. However, there are cases when seizures may last over 3 to 5 minutes. Or the individual may have developed breathing difficulties or severe injuries. If a pregnant patient or a person with diabetes develops a seizure, it is prudent to call an ambulance. - Finally, if the patient does not regain full consciousness, then call EMS. Most patients will remain confused for about 45 to 90 minutes after a seizure, so you must use judgment in calling for help. - Avoid restraints but make sure the patient is in a bed with padded side rails - Place the individual in a lateral position with the neck slightly flexed; this will help the saliva drain from the mouth and prevent the tongue from  falling backward - Remove all nearby furniture and other hazards from the area - Provide verbal assurance as the individual is regaining consciousness - Provide the patient with privacy if possible - Call for help and start treatment as ordered by the caregiver   After the Seizure (Postictal Stage)  After a seizure, most patients experience confusion, fatigue, muscle pain and/or a headache. Thus, one should permit the individual to sleep. For the next few days, reassurance is essential. Being calm and helping reorient the person is also of importance.  Most seizures are painless and end spontaneously. Seizures are not harmful to others but can lead to complications such as stress on the lungs, brain and the heart. Individuals with prior lung problems may develop labored breathing and respiratory distress.     Orders Placed This Encounter  Procedures   Levetiracetam level    Meds ordered this encounter  Medications   levETIRAcetam (KEPPRA) 500 MG tablet    Sig: Take 1 tablet (500 mg total) by mouth 2 (two) times daily.    Dispense:  60 tablet    Refill:  11    Return  in about 3 months (around 02/14/2022).  I have spent a total of 60 minutes dedicated to this patient today, preparing to see patient, performing a medically appropriate examination and evaluation, ordering tests and/or medications and procedures, and counseling and educating the patient/family/caregiver; independently interpreting result and communicating results to the family/patient/caregiver; and documenting clinical information in the electronic medical record.   Windell Norfolk, MD 11/15/2021, 12:39 PM  [facility] [address] [place], [place] [address] [phone]

## 2021-11-15 NOTE — Patient Instructions (Signed)
Continue with Keppra 500 mg twice daily We will obtain a Keppra level today Please contact me if you have any breakthrough seizures or any have any side effect from the Keppra Follow-up in 3 months

## 2021-11-19 LAB — LEVETIRACETAM LEVEL: Levetiracetam Lvl: 21.8 ug/mL (ref 10.0–40.0)

## 2021-11-25 ENCOUNTER — Encounter: Payer: Self-pay | Admitting: Neurology

## 2021-11-29 ENCOUNTER — Ambulatory Visit
Admission: EM | Admit: 2021-11-29 | Discharge: 2021-11-29 | Disposition: A | Discharge status: Home / Self Care | Payer: BC Managed Care – PPO | Attending: Emergency Medicine | Admitting: Emergency Medicine

## 2021-11-29 DIAGNOSIS — B370 Candidal stomatitis: Secondary | ICD-10-CM | POA: Diagnosis not present

## 2021-11-29 DIAGNOSIS — B029 Zoster without complications: Secondary | ICD-10-CM | POA: Diagnosis not present

## 2021-11-29 MED ORDER — VALACYCLOVIR HCL 1 G PO TABS: 1000.0000 mg | ORAL_TABLET | Freq: Three times a day (TID) | ORAL | 0 refills | Status: AC

## 2021-11-29 MED ORDER — NYSTATIN 100000 UNIT/ML MT SUSP
500000.0000 [IU] | Freq: Four times a day (QID) | OROMUCOSAL | 0 refills | Status: AC
Start: 2021-11-29 — End: 2021-12-09

## 2021-11-29 NOTE — Discharge Instructions (Signed)
The pain in your mouth is most likely coming from a yeast infection inside your mouth.  Use the Nystatin four times daily for 10 days to treat this.    Start the Valtrex 1 pill three times daily to prevent new rash from developing.  With no improvement in symptoms, follow up with primary care provider early next week.   If symptoms worsen, go to ER.

## 2021-11-29 NOTE — ED Triage Notes (Signed)
Pt presents with rash on back and also concerned for thrush and has mouth and throat pain

## 2021-11-29 NOTE — ED Provider Notes (Signed)
[facility]    CSN: [id number] Arrival date & time: 11/29/21  1235      History   Chief Complaint Chief Complaint  Patient presents with   Sore Throat    HPI Deborah Lucero is a 61 y.o. female.   Patient presents with a couple days of pain and white patches noted in her mouth as well as a rash on her abdomen.  Denies any new medication use other than Keppra that she was prescribed about 1 month ago when she was in the hospital for seizures.  Reports that she is having significant pain with eating or drinking, at first she thought it was because she was eating a lot of throat lozenges to help with the dry mouth.  No abdominal pain or nausea/vomiting.  She denies any tooth pain.  No patches on the back of her throat.  Rash is itchy and slightly red in a few different areas.  No oozing or crusting.  No fever or nausea/vomiting since rash began.  No recent change in detergents, soaps, or personal care products.  No exposures to outdoor plants that she knows of.    Past Medical History:  Diagnosis Date   Abdominal pain 05/05/2013   Anxiety    Anxiety and depression 10/04/2015   Constipation 05/05/2013   Depression    Hiatal hernia    Insomnia    Mental disorder    PONV (postoperative nausea and vomiting)    Scoliosis    Watery stools 05/05/2013    Patient Active Problem List   Diagnosis Date Noted   Alcohol abuse    Hyponatremia    Seizure (HCC)    Acute encephalopathy    Hypokalemia    Hypomagnesemia    Seizure (HCC) 11/02/2020   Altered mental status 11/02/2020   Lactic acidosis 11/02/2020   Hyponatremia 11/02/2020   Hyperglycemia 11/02/2020   Insomnia 11/02/2020   Alcohol use 11/02/2020   Body mass index 30.0-30.9, adult 08/29/2020   Weight loss counseling, encounter for 08/29/2020   Sleep disturbance 08/01/2020   Class 1 obesity due to excess calories without serious comorbidity with body mass index (BMI) of 34.0 to 34.9 in adult 07/26/2019    Hypercalcemia 06/29/2019   Encounter for gynecological examination with Papanicolaou smear of cervix 09/29/2018   Screening for colorectal cancer 09/29/2018   Chest wall muscle strain 09/29/2018   Superficial fungus infection of skin 09/29/2018   Hot flashes 09/29/2018   Elevated cholesterol 09/29/2018   Elevated BP without diagnosis of hypertension 09/29/2018   Encounter for well woman exam with routine gynecological exam 11/04/2016   Hormone replacement therapy (HRT) 11/04/2016   Anxiety and depression 10/04/2015   GERD (gastroesophageal reflux disease) 12/28/2013   Abdominal pain 05/05/2013   Constipation 05/05/2013   Watery stools 05/05/2013   Anxiety 03/15/2013   Rash 11/15/2012    Past Surgical History:  Procedure Laterality Date   BREAST BIOPSY Right 1986   Benign   BREAST SURGERY     lumpectomy right breast-benign   CESAREAN SECTION  06/24/1999   DIAGNOSTIC LAPAROSCOPY     HYSTEROSCOPY WITH THERMACHOICE  08/14/2011   Procedure: HYSTEROSCOPY WITH THERMACHOICE;  Surgeon: Lazaro Arms, MD;  Location: [facility];  Service: Gynecology;;  Thermachoice Endometrial Ablation Total Therapy Time=76min 44sec    OB History     Gravida  1   Para  1   Term  0   Preterm  0   AB  0   Living  2      SAB  0   IAB  0   Ectopic  0   Multiple  1   Live Births  2            Home Medications    Prior to Admission medications   Medication Sig Start Date End Date Taking? Authorizing Provider  nystatin (MYCOSTATIN) 100000 UNIT/ML suspension Take 5 mLs (500,000 Units total) by mouth 4 (four) times daily for 10 days. 11/29/21 12/09/21 Yes Eulogio Bear, NP  valACYclovir (VALTREX) 1000 MG tablet Take 1 tablet (1,000 mg total) by mouth 3 (three) times daily for 10 days. 11/29/21 12/09/21 Yes Eulogio Bear, NP  albuterol (VENTOLIN HFA) 108 (90 Base) MCG/ACT inhaler Inhale 1-2 puffs into the lungs every 6 (six) hours as needed for wheezing or shortness of breath.  01/20/21   Volney American, PA-C  cyclobenzaprine (FLEXERIL) 10 MG tablet Take 1 tablet (10 mg total) by mouth at bedtime. One tablet every night at bedtime as needed for spasm. 04/25/21   Sanjuana Kava, MD  desmopressin (DDAVP NASAL) 0.01 % solution Place 1 spray (10 mcg total) into the nose daily. 10/19/21   Shelly Coss, MD  folic acid (FOLVITE) 1 MG tablet Take 1 tablet (1 mg total) by mouth daily. 10/19/21   Shelly Coss, MD  Ibuprofen (ADVIL PO) Take by mouth as needed.     [provider]  ibuprofen (ADVIL) 200 MG tablet Take 200-600 mg by mouth daily as needed for headache or mild pain.    [provider]  levETIRAcetam (KEPPRA) 500 MG tablet Take 1 tablet (500 mg total) by mouth 2 (two) times daily. 11/15/21   Alric Ran, MD  Multiple Vitamin (MULTIVITAMIN) tablet Take 1 tablet by mouth daily.    [provider]  NYSTATIN powder APPLY TOPICALLY THREE TIMES DAILY Patient taking differently: Apply 1 application  topically as needed. 09/04/20   Estill Dooms, NP  pantoprazole (PROTONIX) 40 MG tablet Take 40 mg by mouth daily. 12/16/18   [provider]  pseudoephedrine (SUDAFED) 120 MG 12 hr tablet Take 120 mg by mouth as needed.    [provider]  thiamine (VITAMIN B1) 100 MG tablet Take 1 tablet (100 mg total) by mouth daily. 10/19/21   Shelly Coss, MD    Family History Family History  Problem Relation Age of Onset   Alzheimer's disease Father    Alzheimer's disease Sister    Seizures Neg Hx     Social History Social History   Tobacco Use   Smoking status: Former    Types: Cigarettes   Smokeless tobacco: Never  Vaping Use   Vaping Use: Never used  Substance Use Topics   Alcohol use: Yes    Alcohol/week: 6.0 standard drinks of alcohol    Types: 6 Cans of beer per week    Comment: seltzer/white claw   Drug use: Not Currently     Allergies   Codeine and Hydrocodone   Review of Systems Review of  Systems Per HPI  Physical Exam Triage Vital Signs ED Triage Vitals  Enc Vitals Group     BP 11/29/21 1313 108/74     Pulse Rate 11/29/21 1313 85     Resp 11/29/21 1313 20     Temp 11/29/21 1313 98.6 F (37 C)     Temp src --      SpO2 11/29/21 1313 98 %     Weight --  Height --      Head Circumference --      Peak Flow --      Pain Score 11/29/21 1311 7     Pain Loc --      Pain Edu? --      Excl. in [place]? --    No data found.  Updated Vital Signs BP 108/74   Pulse 85   Temp 98.6 F (37 C)   Resp 20   SpO2 98%   Visual Acuity Right Eye Distance:   Left Eye Distance:   Bilateral Distance:    Right Eye Near:   Left Eye Near:    Bilateral Near:     Physical Exam Vitals and nursing note reviewed.  Constitutional:      General: She is not in acute distress.    Appearance: Normal appearance. She is not ill-appearing, toxic-appearing or diaphoretic.  HENT:     Head: Normocephalic and atraumatic.     Right Ear: External ear normal.     Left Ear: External ear normal.     Mouth/Throat:     Mouth: Mucous membranes are moist. Oral lesions present.     Pharynx: Oropharynx is clear.     Comments: Tongue coated white; no buccal lesions Eyes:     General: No scleral icterus.    Extraocular Movements: Extraocular movements intact.  Cardiovascular:     Rate and Rhythm: Normal rate and regular rhythm.  Pulmonary:     Effort: Pulmonary effort is normal. No respiratory distress.  Skin:    Capillary Refill: Capillary refill takes less than 2 seconds.     Findings: Erythema and rash present.          Comments: Multiple areas with distinct testicula rashes with surrounding erythema to area marked.  Rash does not cross midline of body.  No surrounding erythema, fluctuance, active drainage.  Neurological:     Mental Status: She is alert and oriented to person, place, and time.  Psychiatric:        Behavior: Behavior is cooperative.      UC Treatments / Results   Labs (all labs ordered are listed, but only abnormal results are displayed) Labs Reviewed - No data to display  EKG   Radiology No results found.  Procedures Procedures (including critical care time)  Medications Ordered in UC Medications - No data to display  Initial Impression / Assessment and Plan / UC Course  I have reviewed the triage vital signs and the nursing notes.  Pertinent labs & imaging results that were available during my care of the patient were reviewed by me and considered in my medical decision making (see chart for details).    Patient is well-appearing, normotensive, afebrile, not tachycardic, not tachypneic, oxygenating well on room air.  I am unable to scrape any white exudate from the patient's tongue today, therefore I believe she has thrush.  We will treat with nystatin rinses 4 times daily.  Regarding rash, I am suspicious for shingles.  Start Valtrex 1 g 3 times daily.  ER and return precautions discussed.  The patient was given the opportunity to ask questions.  All questions answered to their satisfaction.  The patient is in agreement to this plan.    Final Clinical Impressions(s) / UC Diagnoses   Final diagnoses:  Herpes zoster without complication  Oral thrush     Discharge Instructions      The pain in your mouth is most likely coming from a yeast  infection inside your mouth.  Use the Nystatin four times daily for 10 days to treat this.    Start the Valtrex 1 pill three times daily to prevent new rash from developing.  With no improvement in symptoms, follow up with primary care provider early next week.   If symptoms worsen, go to ER.     ED Prescriptions     Medication Sig Dispense Auth. Provider   nystatin (MYCOSTATIN) 100000 UNIT/ML suspension Take 5 mLs (500,000 Units total) by mouth 4 (four) times daily for 10 days. 200 mL Noemi Chapel A, NP   valACYclovir (VALTREX) 1000 MG tablet Take 1 tablet (1,000 mg total) by mouth 3  (three) times daily for 10 days. 30 tablet Eulogio Bear, NP      PDMP not reviewed this encounter.   Eulogio Bear, NP 11/29/21 1435

## 2021-12-31 ENCOUNTER — Other Ambulatory Visit (HOSPITAL_COMMUNITY): Payer: Self-pay | Admitting: Adult Health

## 2021-12-31 DIAGNOSIS — Z1231 Encounter for screening mammogram for malignant neoplasm of breast: Secondary | ICD-10-CM

## 2022-01-02 ENCOUNTER — Ambulatory Visit (HOSPITAL_COMMUNITY)
Admission: RE | Admit: 2022-01-02 | Discharge: 2022-01-02 | Disposition: A | Discharge status: Home / Self Care | Payer: BC Managed Care – PPO | Source: Ambulatory Visit | Attending: Adult Health | Admitting: Adult Health

## 2022-01-02 DIAGNOSIS — Z1231 Encounter for screening mammogram for malignant neoplasm of breast: Secondary | ICD-10-CM | POA: Diagnosis present

## 2022-01-11 ENCOUNTER — Encounter (INDEPENDENT_AMBULATORY_CARE_PROVIDER_SITE_OTHER): Payer: Self-pay | Admitting: Gastroenterology

## 2022-01-28 ENCOUNTER — Other Ambulatory Visit (HOSPITAL_COMMUNITY)
Admission: RE | Admit: 2022-01-28 | Discharge: 2022-01-28 | Disposition: A | Discharge status: Home / Self Care | Payer: BC Managed Care – PPO | Source: Ambulatory Visit | Attending: Adult Health | Admitting: Adult Health

## 2022-01-28 ENCOUNTER — Ambulatory Visit (INDEPENDENT_AMBULATORY_CARE_PROVIDER_SITE_OTHER): Payer: BC Managed Care – PPO | Admitting: Adult Health

## 2022-01-28 ENCOUNTER — Encounter: Payer: Self-pay | Admitting: Adult Health

## 2022-01-28 VITALS — BP 105/74 | HR 83 | Ht 65.0 in | Wt 166.0 lb

## 2022-01-28 DIAGNOSIS — Z01419 Encounter for gynecological examination (general) (routine) without abnormal findings: Secondary | ICD-10-CM

## 2022-01-28 DIAGNOSIS — Z1211 Encounter for screening for malignant neoplasm of colon: Secondary | ICD-10-CM | POA: Diagnosis not present

## 2022-01-28 DIAGNOSIS — Z1212 Encounter for screening for malignant neoplasm of rectum: Secondary | ICD-10-CM

## 2022-01-28 LAB — HEMOCCULT GUIAC POC 1CARD (OFFICE): Fecal Occult Blood, POC: NEGATIVE

## 2022-01-28 NOTE — Progress Notes (Signed)
Patient ID: Deborah Lucero, female   DOB: 15-Nov-1960, 61 y.o.   MRN: [id number] History of Present Illness: Deborah Lucero is a 61 year old white female,single, PM in for a well woman gyn exam and pap. She has had several seizures due to low sodium she says and is on Keppra now. She says she is forgetful at times, has some constipation and does not sleep well. And can be mmody, easy to get teary.  PCP is Dr Willey Blade    Current Medications, Allergies, Past Medical History, Past Surgical History, Family History and Social History were reviewed in [place] record.     Review of Systems: Patient denies any headaches, hearing loss, fatigue, blurred vision, shortness of breath, chest pain, abdominal pain, problems with urination, or intercourse. No joint pain. No vaginal bleeding See HPI for positives.    Physical Exam:BP 105/74 (BP Location: Left Arm, Patient Position: Sitting, Cuff Size: Normal)   Pulse 83   Ht 5\' 5"  (1.651 m)   Wt 166 lb (75.3 kg)   BMI 27.62 kg/m   General:  Well developed, well nourished, no acute distress Skin:  Warm and dry Neck:  Midline trachea, normal thyroid, good ROM, no lymphadenopathy Lungs; Clear to auscultation bilaterally Breast:  No dominant palpable mass, retraction, or nipple discharge Cardiovascular: Regular rate and rhythm Abdomen:  Soft, non tender, no hepatosplenomegaly Pelvic:  External genitalia is normal in appearance, no lesions.  The vagina is pale. Urethra has no lesions or masses. The cervix is smooth, pap with HR HPV genotyping performed.  Uterus is felt to be normal size, shape, and contour.  No adnexal masses or tenderness noted.Bladder is non tender, no masses felt. Rectal: Good sphincter tone, no polyps, or hemorrhoids felt.  Hemoccult negative. Extremities/musculoskeletal:  No swelling or varicosities noted, no clubbing or cyanosis Psych:  No mood changes, alert and cooperative,seems happy AA is 1 Fall risk is low     01/28/2022    2:41 PM 08/01/2020   11:50 AM 10/25/2019    2:14 PM  Depression screen PHQ 2/9  Decreased Interest 0 0 1  Down, Depressed, Hopeless 2 0 0  PHQ - 2 Score 2 0 1  Altered sleeping 3 1 1   Tired, decreased energy 1 1 0  Change in appetite 0 2 0  Feeling bad or failure about yourself  2 1 0  Trouble concentrating 0 0 0  Moving slowly or fidgety/restless 1  0  Suicidal thoughts 0 0 0  PHQ-9 Score 9 5 2   Difficult doing work/chores  Not difficult at all Not difficult at all       01/28/2022    2:41 PM 08/01/2020   11:52 AM 10/25/2019    2:16 PM  GAD 7 : Generalized Anxiety Score  Nervous, Anxious, on Edge 1 1 0  Control/stop worrying 1 0 0  Worry too much - different things 1 0 0  Trouble relaxing 1 1 0  Restless 1 1 0  Easily annoyed or irritable 2 1 0  Afraid - awful might happen 2 1 0  Total GAD 7 Score 9 5 0  Anxiety Difficulty  Not difficult at all Not difficult at all      Upstream - 01/28/22 1447       Pregnancy Intention Screening   Does the patient want to become pregnant in the next year? N/A    Does the patient's partner want to become pregnant in the next year? N/A  Would the patient like to discuss contraceptive options today? N/A      Contraception Wrap Up   Current Method No Method - Other Reason   ablation and PM   End Method No Method - Other Reason   ablation and PM   Contraception Counseling Provided No            Examination chaperoned by Malachy Mood LPN  Impression and Plan: 1. Encounter for gynecological examination with Papanicolaou smear of cervix Pap sent Pap in 3 years if negative Physical in 1 year Labs with neurology Had negative mammogram 01/02/22 Talk with neurology about sleep  - Cytology - PAP( [place])  2. Encounter for screening fecal occult blood testing Hemoccult was negative  - POCT occult blood stool  3. Encounter for colorectal cancer screening Referred for colonoscopy - Ambulatory referral to  Gastroenterology

## 2022-01-29 ENCOUNTER — Encounter: Payer: Self-pay | Admitting: *Deleted

## 2022-01-31 LAB — CYTOLOGY - PAP
Adequacy: ABSENT
Comment: NEGATIVE
Diagnosis: NEGATIVE
High risk HPV: NEGATIVE

## 2022-02-04 ENCOUNTER — Other Ambulatory Visit: Payer: Self-pay | Admitting: Adult Health

## 2022-02-05 ENCOUNTER — Encounter: Payer: Self-pay | Admitting: Neurology

## 2022-02-05 ENCOUNTER — Ambulatory Visit: Payer: BC Managed Care – PPO | Admitting: Neurology

## 2022-02-05 VITALS — BP 126/83 | HR 75 | Ht 65.0 in | Wt 165.0 lb

## 2022-02-05 DIAGNOSIS — R569 Unspecified convulsions: Secondary | ICD-10-CM | POA: Diagnosis not present

## 2022-02-05 DIAGNOSIS — E871 Hypo-osmolality and hyponatremia: Secondary | ICD-10-CM

## 2022-02-05 NOTE — Patient Instructions (Signed)
Decrease Keppra to 500 mg nightly for one week, then discontinue  Will check a Sodium level today Continue your other medications  Continue to follow up with PCP and nephrologist and return as needed

## 2022-02-05 NOTE — Progress Notes (Signed)
[facility]  PATIENT: Deborah Lucero DOB: 12-Mar-1960  REQUESTING CLINICIAN: No ref. provider found HISTORY FROM: Patient and daughter  REASON FOR VISIT: Seizure disorder   HISTORICAL  CHIEF COMPLAINT:  Chief Complaint  Patient presents with   Follow-up    Rm 13. Alone. No new seizure activity. Reports Keppra causes frequent dull headache.   INTERVAL HISTORY 02/05/2022:  Patient presents today for follow-up, denies any seizure since last visit.  She reports that her latest sodium has been normal to the point that her nephrologist stopped the desmopressin.  She is compliant with the Keppra 500 mg twice daily but reports side effect of dull headache.  No other complaints no other concerns.    HISTORY OF PRESENT ILLNESS:  This is a [age] year old woman past medical history of anxiety/depression and seizure who is presenting after being admitted recently for seizure in the setting of hyponatremia.  Per daughter patient had a generalized tonic-clonic seizures on August 12 which required her to call EMS.  She believes that patient may have a second seizure on the way to the hospital.  In the hospital she was noted to have hyponatremia down to 111.  Seizure was thought to be secondary to hyponatremia.  Patient was seen by nephrology, started on desmopressin and fluid restriction with improvement of the sodium. Her last sodium level was 128. Patient also had a similar presentation last year in September when she had a seizure and was found to be hyponatremic to 124.  At that time she was not started on medication but on this last hospitalization she was started on Keppra 500 mg twice daily.  She reports compliance with the medication, denies any side effect.  Prior to hospitalization she was on Wellbutrin but upon discharge Wellbutrin was not restarted so there is some concern for mood and irritability.  She denies any other seizure risk factor except history of alcohol abuse.  She reports drinking a12 pack of beer in a week but since discharge from the hospital she has not drink alcohol.     Handedness: Right handed   Onset: August 12 but had her first seizure November 02 2020  Seizure Type: Generalized seizure with tongue biting   Current frequency: She had 2 seizures, last one being on August 12  Any injuries from seizures: Tongue biting   Seizure risk factors: Hyponatremia, mother has seizure from encephalitis  Previous ASMs: None   Currenty ASMs: Keppra   ASMs side effects: Questionable Irritability (She was on Wellbutrin but after recent seizures, meds was discontinued)   Brain Images: Normal head CT   Previous EEGs: Diffuse slowing    Hospital course and summary  Patient is a 61 year old female with history of chronic alcohol abuse who [facility].  Head CT was negative and was started on Keppra.  History of seizure a year ago from hyponatremia also.  She again had seizure-like episode on 8/12 and was brought to the emergency department.  Patient was encephalopathic on presentation.  CT head did not show any acute abnormalities.  Sodium was found to be 111.  Patient was admitted for the management of hyponatremic induced seizure.  Neurology consulted, admitted to ICU.  Hospital course remarkable for persistent hyponatremia, nephrology was consulted.  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  Started on nasal DDAVP.  Sodium level has improved to 129 today.  Patient has been cleared by nephrology  for discharge.  She will be followed by nephrology in a week.   Seizure/acute encephalopathy: Presented with 2-3 episodes of seizure.  History of seizure in the past due to hyponatremia.  Postictal on presentation.  Initially started on CIWA protocol.  Currently not in withdrawal. Currently on Keppra.  We recommend her to follow-up with neurology as an outpatient.  EEG  showed mild generalized nonspecific cerebral dysfunction, no seizure epileptiform activity. Currently she is alert and oriented.   Hyponatremia: Initially  to be secondary to beer potomania/decreased fluid intake.Marland Kitchen  History of chronic hyponatremia.  Nephrology following.  Started on DDAVP for suspicion of concurrent central  diabetes insipidus.  Sodium stable at 129 today.  She will be discharged with plan for follow-up with nephrology in a week   Chronic alcohol abuse: Drinks beer.  Counseled for cessation. continue thiamine folic acid.   OTHER MEDICAL CONDITIONS: Anxiety/Depression, seizure  REVIEW OF SYSTEMS: Full 14 system review of systems performed and negative with exception of: As noted in the HPI   ALLERGIES: Allergies  Allergen Reactions   Codeine Other (See Comments)    Hallucinations   Hydrocodone Other (See Comments)    Syncope     HOME MEDICATIONS: Outpatient Medications Prior to Visit  Medication Sig Dispense Refill   Docusate Sodium (COLACE PO) Take by mouth as needed.     folic acid (FOLVITE) 1 MG tablet Take 1 tablet (1 mg total) by mouth daily. 30 tablet 1   ibuprofen (ADVIL) 200 MG tablet Take 200-600 mg by mouth daily as needed for headache or mild pain.     levETIRAcetam (KEPPRA) 500 MG tablet Take 1 tablet (500 mg total) by mouth 2 (two) times daily. 60 tablet 11   Multiple Vitamin (MULTIVITAMIN) tablet Take 1 tablet by mouth daily.     pantoprazole (PROTONIX) 40 MG tablet Take 40 mg by mouth daily.     pseudoephedrine (SUDAFED) 120 MG 12 hr tablet Take 120 mg by mouth as needed.     thiamine (VITAMIN B1) 100 MG tablet Take 1 tablet (100 mg total) by mouth daily. 30 tablet 1   No facility-administered medications prior to visit.    PAST MEDICAL HISTORY: Past Medical History:  Diagnosis Date   Abdominal pain 05/05/2013   Anxiety    Anxiety and depression 10/04/2015   Constipation 05/05/2013   Depression    Hiatal hernia    Insomnia    Mental  disorder    PONV (postoperative nausea and vomiting)    Scoliosis    Seizures (HCC)    Watery stools 05/05/2013    PAST SURGICAL HISTORY: Past Surgical History:  Procedure Laterality Date   BREAST BIOPSY Right 1986   Benign   BREAST SURGERY     lumpectomy right breast-benign   CESAREAN SECTION  06/24/1999   DIAGNOSTIC LAPAROSCOPY     HYSTEROSCOPY WITH THERMACHOICE  08/14/2011   Procedure: HYSTEROSCOPY WITH THERMACHOICE;  Surgeon: Lazaro Arms, MD;  Location: [facility];  Service: Gynecology;;  Thermachoice Endometrial Ablation Total Therapy Time=63min 44sec    FAMILY HISTORY: Family History  Problem Relation Age of Onset   Alzheimer's disease Father    Alzheimer's disease Sister    Seizures Neg Hx     SOCIAL HISTORY: Social History   Socioeconomic History   Marital status: Single    Spouse name: Not on file   Number of children: Not on file   Years of education: Not on file   Highest education level: Not on  file  Occupational History   Not on file  Tobacco Use   Smoking status: Former    Types: Cigarettes   Smokeless tobacco: Never  Vaping Use   Vaping Use: Never used  Substance and Sexual Activity   Alcohol use: Yes    Alcohol/week: 6.0 standard drinks of alcohol    Types: 6 Cans of beer per week    Comment: occ   Drug use: Not Currently   Sexual activity: Yes    Birth control/protection: Surgical, Post-menopausal    Comment: ablation  Other Topics Concern   Not on file  Social History Narrative   ** Merged History Encounter **       Social Determinants of Health   Financial Resource Strain: Low Risk  (01/28/2022)   Overall Financial Resource Strain (CARDIA)    Difficulty of Paying Living Expenses: Not very hard  Food Insecurity: No Food Insecurity (01/28/2022)   Hunger Vital Sign    Worried About Running Out of Food in the Last Year: Never true    Ran Out of Food in the Last Year: Never true  Transportation Needs: No Transportation Needs (01/28/2022)    PRAPARE - Administrator, Civil Service (Medical): No    Lack of Transportation (Non-Medical): No  Physical Activity: Insufficiently Active (01/28/2022)   Exercise Vital Sign    Days of Exercise per Week: 3 days    Minutes of Exercise per Session: 30 min  Stress: Stress Concern Present (01/28/2022)   [employer] of Occupational Health - Occupational Stress Questionnaire    Feeling of Stress : Rather much  Social Connections: Moderately Integrated (01/28/2022)   Social Connection and Isolation Panel [NHANES]    Frequency of Communication with Friends and Family: More than three times a week    Frequency of Social Gatherings with Friends and Family: Twice a week    Attends Religious Services: More than 4 times per year    Active Member of [employer] or Organizations: Yes    Attends Banker Meetings: 1 to 4 times per year    Marital Status: Divorced  Catering manager Violence: Not At Risk (01/28/2022)   Humiliation, Afraid, Rape, and Kick questionnaire    Fear of Current or Ex-Partner: No    Emotionally Abused: No    Physically Abused: No    Sexually Abused: No    PHYSICAL EXAM  GENERAL EXAM/CONSTITUTIONAL: Vitals:  Vitals:   02/05/22 1537  BP: 126/83  Pulse: 75  Weight: 165 lb (74.8 kg)  Height: 5\' 5"  (1.651 m)    Body mass index is 27.46 kg/m. Wt Readings from Last 3 Encounters:  02/05/22 165 lb (74.8 kg)  01/28/22 166 lb (75.3 kg)  10/18/21 175 lb 7.8 oz (79.6 kg)   Patient is in no distress; well developed, nourished and groomed; neck is supple  EYES: Pupils round and reactive to light, Visual fields full to confrontation, Extraocular movements intacts,  No results found.  MUSCULOSKELETAL: Gait, strength, tone, movements noted in Neurologic exam below  NEUROLOGIC: MENTAL STATUS:      No data to display         awake, alert, oriented to person, place and time recent and remote memory intact normal attention and  concentration language fluent, comprehension intact, naming intact fund of knowledge appropriate  CRANIAL NERVE:  2nd, 3rd, 4th, 6th - visual fields full to confrontation, extraocular muscles intact, no nystagmus 5th - facial sensation symmetric 7th - facial strength symmetric 8th - hearing  intact 9th - palate elevates symmetrically, uvula midline 11th - shoulder shrug symmetric 12th - tongue protrusion midline  MOTOR:  normal bulk and tone, full strength in the BUE, BLE  SENSORY:  normal and symmetric to light touch  COORDINATION:  finger-nose-finger, fine finger movements normal  REFLEXES:  deep tendon reflexes present and symmetric  GAIT/STATION:  normal   DIAGNOSTIC DATA (LABS, IMAGING, TESTING) - I reviewed patient records, labs, notes, testing and imaging myself where available.  Lab Results  Component Value Date   WBC 9.1 10/16/2021   HGB 12.3 10/16/2021   HCT 33.0 (L) 10/16/2021   MCV 85.7 10/16/2021   PLT 331 10/16/2021      Component Value Date/Time   NA 129 (L) 10/18/2021 0813   NA 139 06/27/2019 0819   K 3.7 10/18/2021 0426   CL 92 (L) 10/18/2021 0426   CO2 24 10/18/2021 0426   GLUCOSE 103 (H) 10/18/2021 0426   BUN 5 (L) 10/18/2021 0426   BUN 12 06/27/2019 0819   CREATININE 0.69 10/18/2021 0426   CREATININE 0.85 05/05/2013 1710   CALCIUM 9.3 10/18/2021 0426   PROT 5.3 (L) 10/13/2021 0541   PROT 6.6 06/27/2019 0819   ALBUMIN 3.3 (L) 10/13/2021 0541   ALBUMIN 4.1 06/27/2019 0819   AST 32 10/13/2021 0541   ALT 18 10/13/2021 0541   ALKPHOS 49 10/13/2021 0541   BILITOT 0.9 10/13/2021 0541   BILITOT <0.2 06/27/2019 0819   GFRNONAA >60 10/18/2021 0426   GFRAA 72 06/27/2019 0819   Lab Results  Component Value Date   CHOL 230 (H) 12/31/2018   HDL 44 12/31/2018   LDLCALC 147 (H) 12/31/2018   TRIG 214 (H) 12/31/2018   Lab Results  Component Value Date   HGBA1C 5.0 10/04/2015   No results found for: "VITAMINB12" Lab Results  Component  Value Date   TSH 1.513 10/13/2021    MRI Brain 11/16/20 Intermittently motion degraded examination, as described. No evidence of acute intracranial abnormality. No specific seizure focus is identified. A few small scattered foci of T2/FLAIR hyperintense signal abnormality within the cerebral white matter are nonspecific, but most often secondary to chronic small vessel ischemia.   Mild generalized cerebral atrophy. CT Head 10/13/21 No acute intracranial CT findings or interval changes. Scattered carotid atherosclerosis.   EEG 10/13/21:  This EEG is consistent with a mild generalized nonspecific cerebral dysfunction (encephalopathy). There was no seizure or seizure predisposition recorded on this study. Please note that lack of epileptiform activity on EEG does not preclude the possibility of epilepsy.  I personally reviewed brain Images and previous EEG reports.   ASSESSMENT AND PLAN  61 y.o. year old female  with history of anxiety and depression who is presenting for follow up for her symptomatic seizure.  She reported her latest sodium was normal to the point that her nephrologist discontinued the desmopressin.  Reported the Keppra has been giving her headaches.  Since her seizures were acute symptomatic, and her sodium normalized, we will plan to discontinue the Keppra.  Advised patient to continue Keppra 500 mg nightly for 1 week and then to discontinue.  I will check a sodium level today.  In the case that she has a breakthrough seizure, please restart Keppra and contact me.  Continue to follow PCP and return as needed.   1. Hyponatremia   2. Seizures (HCC)      Patient Instructions  Decrease Keppra to 500 mg nightly for one week, then discontinue  Will check a  Sodium level today Continue your other medications  Continue to follow up with PCP and nephrologist and return as needed    Per [facility] statutes, patients with seizures are not allowed to drive until they  have been seizure-free for six months.  Other recommendations include using caution when using heavy equipment or power tools. Avoid working on ladders or at heights. Take showers instead of baths.  Do not swim alone.  Ensure the water temperature is not too high on the home water heater. Do not go swimming alone. Do not lock yourself in a room alone (i.e. bathroom). When caring for infants or small children, sit down when holding, feeding, or changing them to minimize risk of injury to the child in the event you have a seizure. Maintain good sleep hygiene. Avoid alcohol.  Also recommend adequate sleep, hydration, good diet and minimize stress.   During the Seizure  - First, ensure adequate ventilation and place patients on the floor on their left side  Loosen clothing around the neck and ensure the airway is patent. If the patient is clenching the teeth, do not force the mouth open with any object as this can cause severe damage - Remove all items from the surrounding that can be hazardous. The patient may be oblivious to what's happening and may not even know what he or she is doing. If the patient is confused and wandering, either gently guide him/her away and block access to outside areas - Reassure the individual and be comforting - Call 911. In most cases, the seizure ends before EMS arrives. However, there are cases when seizures may last over 3 to 5 minutes. Or the individual may have developed breathing difficulties or severe injuries. If a pregnant patient or a person with diabetes develops a seizure, it is prudent to call an ambulance. - Finally, if the patient does not regain full consciousness, then call EMS. Most patients will remain confused for about 45 to 90 minutes after a seizure, so you must use judgment in calling for help. - Avoid restraints but make sure the patient is in a bed with padded side rails - Place the individual in a lateral position with the neck slightly flexed; this  will help the saliva drain from the mouth and prevent the tongue from falling backward - Remove all nearby furniture and other hazards from the area - Provide verbal assurance as the individual is regaining consciousness - Provide the patient with privacy if possible - Call for help and start treatment as ordered by the caregiver   After the Seizure (Postictal Stage)  After a seizure, most patients experience confusion, fatigue, muscle pain and/or a headache. Thus, one should permit the individual to sleep. For the next few days, reassurance is essential. Being calm and helping reorient the person is also of importance.  Most seizures are painless and end spontaneously. Seizures are not harmful to others but can lead to complications such as stress on the lungs, brain and the heart. Individuals with prior lung problems may develop labored breathing and respiratory distress.     Orders Placed This Encounter  Procedures   Basic Metabolic Panel    No orders of the defined types were placed in this encounter.   Return if symptoms worsen or fail to improve.  I have spent a total of 30 minutes dedicated to this patient today, preparing to see patient, performing a medically appropriate examination and evaluation, ordering tests and/or medications and procedures, and counseling  and educating the patient/family/caregiver; independently interpreting result and communicating results to the family/patient/caregiver; and documenting clinical information in the electronic medical record.   Windell Norfolk, MD 02/05/2022, 4:07 PM  [facility] [address], [address] [place], [place] [address] [phone]

## 2022-02-06 ENCOUNTER — Encounter: Payer: Self-pay | Admitting: Neurology

## 2022-02-06 LAB — BASIC METABOLIC PANEL
BUN/Creatinine Ratio: 13 (ref 9–23)
BUN: 11 mg/dL (ref 6–24)
CO2: 25 mmol/L (ref 20–29)
Calcium: 10.4 mg/dL — ABNORMAL HIGH (ref 8.7–10.2)
Chloride: 102 mmol/L (ref 96–106)
Creatinine, Ser: 0.86 mg/dL (ref 0.57–1.00)
Glucose: 97 mg/dL (ref 70–99)
Potassium: 4.6 mmol/L (ref 3.5–5.2)
Sodium: 140 mmol/L (ref 134–144)
eGFR: 78 mL/min/{1.73_m2} (ref >59)

## 2022-02-25 ENCOUNTER — Ambulatory Visit: Payer: BC Managed Care – PPO | Admitting: Neurology

## 2022-05-01 ENCOUNTER — Encounter: Payer: Self-pay | Admitting: Radiology

## 2022-05-13 ENCOUNTER — Ambulatory Visit: Payer: BC Managed Care – PPO | Admitting: Orthopaedic Surgery

## 2022-05-22 ENCOUNTER — Other Ambulatory Visit: Payer: Self-pay | Admitting: Adult Health

## 2022-05-22 MED ORDER — BUSPIRONE HCL 5 MG PO TABS: 5.0000 mg | ORAL_TABLET | Freq: Two times a day (BID) | ORAL | 3 refills | Status: DC

## 2022-05-22 NOTE — Progress Notes (Signed)
Rx buspar

## 2022-05-26 ENCOUNTER — Other Ambulatory Visit: Payer: Self-pay | Admitting: Adult Health

## 2022-05-26 MED ORDER — BETAMETHASONE DIPROPIONATE 0.05 % EX CREA: TOPICAL_CREAM | Freq: Two times a day (BID) | CUTANEOUS | 0 refills | Status: DC

## 2022-05-26 NOTE — Progress Notes (Signed)
Refilled betamethasone

## 2022-06-03 ENCOUNTER — Other Ambulatory Visit: Payer: Self-pay | Admitting: Adult Health

## 2022-06-17 ENCOUNTER — Other Ambulatory Visit: Payer: Self-pay | Admitting: Adult Health

## 2022-06-17 MED ORDER — TRAZODONE HCL 50 MG PO TABS: 50.0000 mg | ORAL_TABLET | Freq: Every evening | ORAL | 1 refills | Status: DC | PRN

## 2022-06-17 NOTE — Progress Notes (Signed)
Rx trazodone for sleep  °

## 2022-07-30 ENCOUNTER — Other Ambulatory Visit: Payer: Self-pay | Admitting: Adult Health

## 2022-09-28 ENCOUNTER — Other Ambulatory Visit: Payer: Self-pay | Admitting: Adult Health

## 2022-10-29 ENCOUNTER — Other Ambulatory Visit: Payer: Self-pay | Admitting: Adult Health

## 2022-12-01 ENCOUNTER — Other Ambulatory Visit (HOSPITAL_COMMUNITY): Payer: Self-pay | Admitting: Adult Health

## 2022-12-01 DIAGNOSIS — Z1231 Encounter for screening mammogram for malignant neoplasm of breast: Secondary | ICD-10-CM

## 2023-01-05 ENCOUNTER — Encounter (HOSPITAL_COMMUNITY): Payer: Self-pay

## 2023-01-05 ENCOUNTER — Ambulatory Visit (HOSPITAL_COMMUNITY)
Admission: RE | Admit: 2023-01-05 | Discharge: 2023-01-05 | Disposition: A | Discharge status: Home / Self Care | Payer: BC Managed Care – PPO | Source: Ambulatory Visit | Attending: Adult Health | Admitting: Adult Health

## 2023-01-05 DIAGNOSIS — Z1231 Encounter for screening mammogram for malignant neoplasm of breast: Secondary | ICD-10-CM | POA: Insufficient documentation

## 2023-01-28 ENCOUNTER — Other Ambulatory Visit: Payer: Self-pay | Admitting: Adult Health

## 2023-02-02 ENCOUNTER — Encounter: Payer: Self-pay | Admitting: Adult Health

## 2023-02-02 ENCOUNTER — Ambulatory Visit: Payer: BC Managed Care – PPO | Admitting: Adult Health

## 2023-02-02 VITALS — BP 128/82 | HR 75 | Ht 65.0 in | Wt 182.0 lb

## 2023-02-02 DIAGNOSIS — F419 Anxiety disorder, unspecified: Secondary | ICD-10-CM | POA: Diagnosis not present

## 2023-02-02 DIAGNOSIS — Z1211 Encounter for screening for malignant neoplasm of colon: Secondary | ICD-10-CM | POA: Diagnosis not present

## 2023-02-02 DIAGNOSIS — G479 Sleep disorder, unspecified: Secondary | ICD-10-CM | POA: Diagnosis not present

## 2023-02-02 DIAGNOSIS — Z01419 Encounter for gynecological examination (general) (routine) without abnormal findings: Secondary | ICD-10-CM | POA: Diagnosis not present

## 2023-02-02 DIAGNOSIS — Z1331 Encounter for screening for depression: Secondary | ICD-10-CM

## 2023-02-02 LAB — HEMOCCULT GUIAC POC 1CARD (OFFICE): Fecal Occult Blood, POC: NEGATIVE

## 2023-02-02 MED ORDER — BUSPIRONE HCL 5 MG PO TABS: 5.0000 mg | ORAL_TABLET | Freq: Two times a day (BID) | ORAL | 12 refills | Status: DC

## 2023-02-02 MED ORDER — TRAZODONE HCL 100 MG PO TABS: 100.0000 mg | ORAL_TABLET | Freq: Every day | ORAL | 6 refills | Status: DC

## 2023-02-02 NOTE — Progress Notes (Signed)
Patient ID: Deborah Lucero, female   DOB: 22-May-1960, 62 y.o.   MRN: [id number] History of Present Illness: Deborah Lucero is a 62 year old white female, single, PM in for a well woman gyn exam.     Component Value Date/Time   DIAGPAP  01/28/2022 1450    - Negative for intraepithelial lesion or malignancy (NILM)   DIAGPAP  09/29/2018 0000    NEGATIVE FOR INTRAEPITHELIAL LESIONS OR MALIGNANCY.   HPVHIGH Negative 01/28/2022 1450   ADEQPAP  01/28/2022 1450    Satisfactory for evaluation; transformation zone component ABSENT.   ADEQPAP  09/29/2018 0000    Satisfactory for evaluation  endocervical/transformation zone component ABSENT.   PCP is Dr Ouida Sills.   Current Medications, Allergies, Past Medical History, Past Surgical History, Family History and Social History were reviewed in [name].     Review of Systems: Patient denies any headaches, hearing loss, fatigue, blurred vision, shortness of breath, chest pain, abdominal pain, problems with bowel movements, urination, or intercourse(not active). No joint pain or mood swings.  Not sleeping well   Physical Exam:BP 128/82 (BP Location: Left Arm, Patient Position: Sitting, Cuff Size: Normal)   Pulse 75   Ht 5\' 5"  (1.651 m)   Wt 182 lb (82.6 kg)   BMI 30.29 kg/m   General:  Well developed, well nourished, no acute distress Skin:  Warm and dry Neck:  Midline trachea, normal thyroid, good ROM, no lymphadenopathy,no carotid bruits heard Lungs; Clear to auscultation bilaterally Breast:  No dominant palpable mass, retraction, or nipple discharge Cardiovascular: Regular rate and rhythm Abdomen:  Soft, non tender, no hepatosplenomegaly Pelvic:  External genitalia is normal in appearance, no lesions.  The vagina is pale. Urethra has no lesions or masses. The cervix is smooth.  Uterus is felt to be normal size, shape, and contour.  No adnexal masses or tenderness noted.Bladder is non tender, no masses felt. Rectal:  Good sphincter tone, no polyps, or hemorrhoids felt.  Hemoccult negative. Extremities/musculoskeletal:  No swelling or varicosities noted, no clubbing or cyanosis Psych:  No mood changes, alert and cooperative,seems happy AA is 4 Fall risk is low    02/02/2023    2:40 PM 01/28/2022    2:41 PM 08/01/2020   11:50 AM  Depression screen PHQ 2/9  Decreased Interest 0 0 0  Down, Depressed, Hopeless 1 2 0  PHQ - 2 Score 1 2 0  Altered sleeping 3 3 1   Tired, decreased energy 1 1 1   Change in appetite 0 0 2  Feeling bad or failure about yourself  1 2 1   Trouble concentrating 0 0 0  Moving slowly or fidgety/restless 0 1   Suicidal thoughts 0 0 0  PHQ-9 Score 6 9 5   Difficult doing work/chores   Not difficult at all       02/02/2023    2:40 PM 01/28/2022    2:41 PM 08/01/2020   11:52 AM 10/25/2019    2:16 PM  GAD 7 : Generalized Anxiety Score  Nervous, Anxious, on Edge 1 1 1  0  Control/stop worrying 0 1 0 0  Worry too much - different things 0 1 0 0  Trouble relaxing 0 1 1 0  Restless 0 1 1 0  Easily annoyed or irritable 1 2 1  0  Afraid - awful might happen 1 2 1  0  Total GAD 7 Score 3 9 5  0  Anxiety Difficulty   Not difficult at all Not difficult at all  Upstream - 02/02/23 1446       Pregnancy Intention Screening   Does the patient want to become pregnant in the next year? N/A    Does the patient's partner want to become pregnant in the next year? N/A    Would the patient like to discuss contraceptive options today? N/A      Contraception Wrap Up   Current Method Abstinence   ablation, PM   End Method Abstinence   ablation,PM   Contraception Counseling Provided No            Examination chaperoned by Malachy Mood LPN   Impression and Plan: 1. Encounter for well woman exam with routine gynecological exam Physical in 1 year Labs with PCP Pap in 2026 Mammogram was negative 01/05/23   2. Encounter for screening fecal occult blood testing Hemoccult was negative  -  POCT occult blood stool  3. Sleep disturbance Will increase trazodone to 100 mg 1 at hs  Meds ordered this encounter  Medications   busPIRone (BUSPAR) 5 MG tablet    Sig: Take 1 tablet (5 mg total) by mouth 2 (two) times daily.    Dispense:  60 tablet    Refill:  12    Order Specific Question:   Supervising Provider    Answer:   Duane Lope H [[id number]]   traZODone (DESYREL) 100 MG tablet    Sig: Take 1 tablet (100 mg total) by mouth at bedtime.    Dispense:  30 tablet    Refill:  6    Order Specific Question:   Supervising Provider    Answer:   Despina Hidden, LUTHER H [[id number]]     4. Anxiety On buspar 5 mg 1 bid will refill

## 2023-06-05 IMAGING — CT CT HEAD W/O CM
3 series · 16 of 47 positions shown, 19 images · non-contrast
Comparison: None.

CLINICAL DATA: Seizure and confusion.

EXAM:
CT HEAD WITHOUT CONTRAST
TECHNIQUE: Contiguous axial images were obtained from the base of the skull
through the vertex without intravenous contrast.

[Series 2: head w o · axial · 0.48mm/px · z∈[-16,+124]mm · 10 of 34 slices shown, 13 images]
[im 3/34  brain]
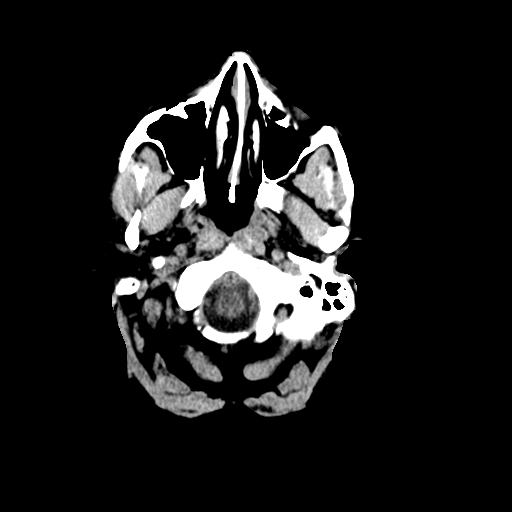
[im 3/34  bone]
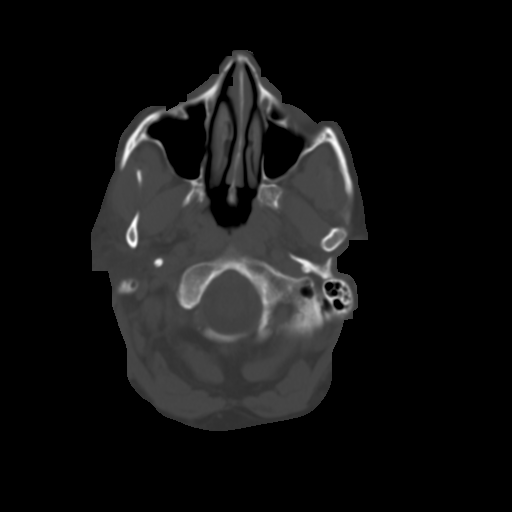
[im 6/34  brain]
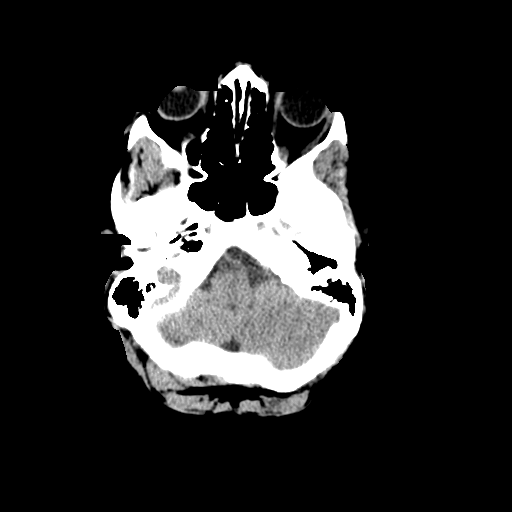
[im 10/34  brain]
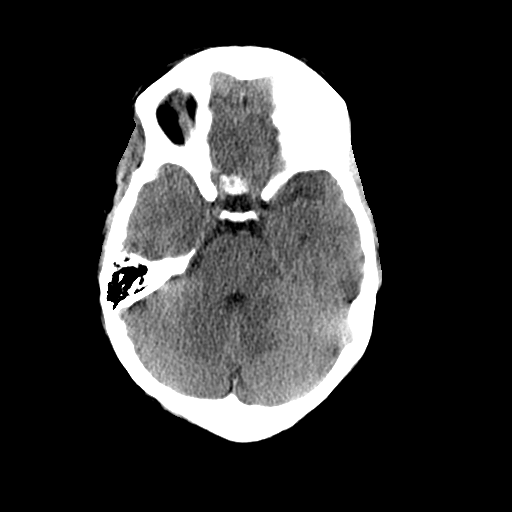
[im 12/34  brain]
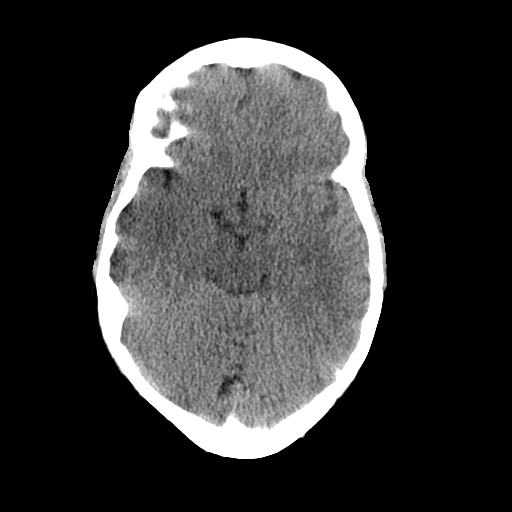
[im 15/34  brain]
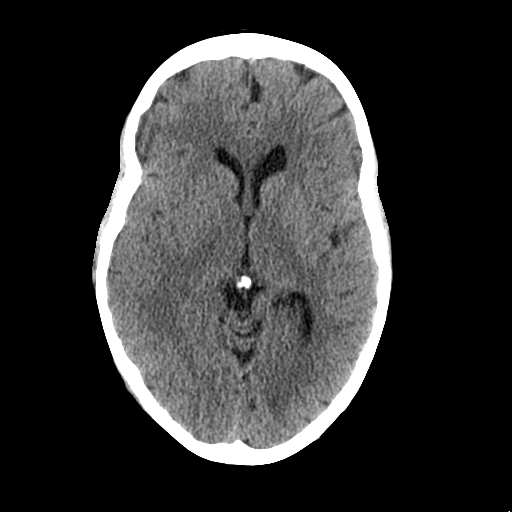
[im 15/34  bone]
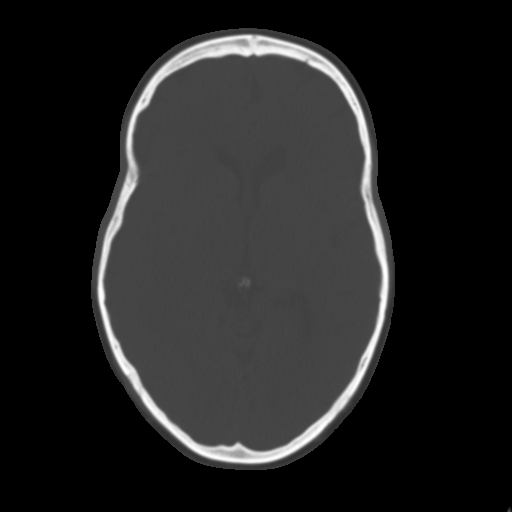
[im 19/34  brain]
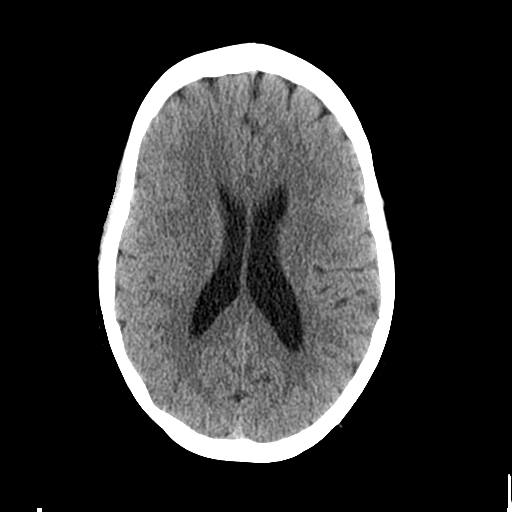
[im 22/34  brain]
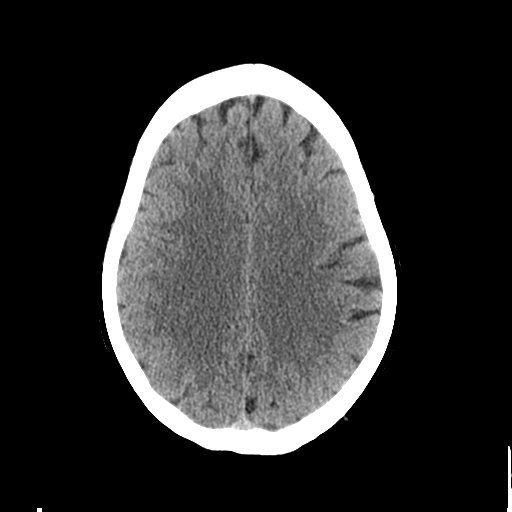
[im 26/34  brain]
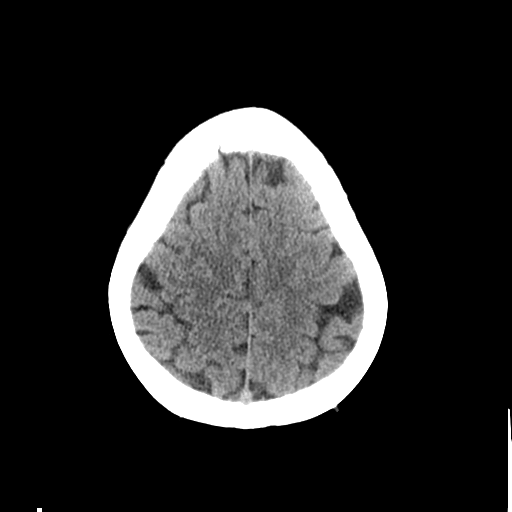
[im 28/34  brain]
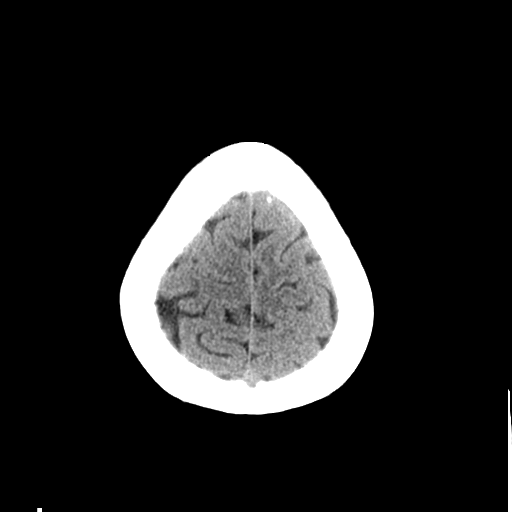
[im 28/34  bone]
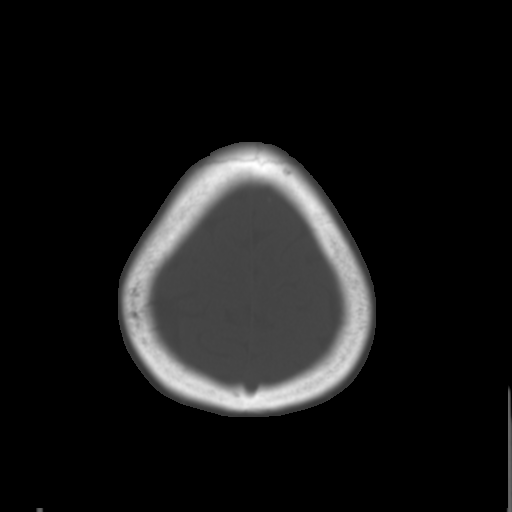
[im 31/34  brain]
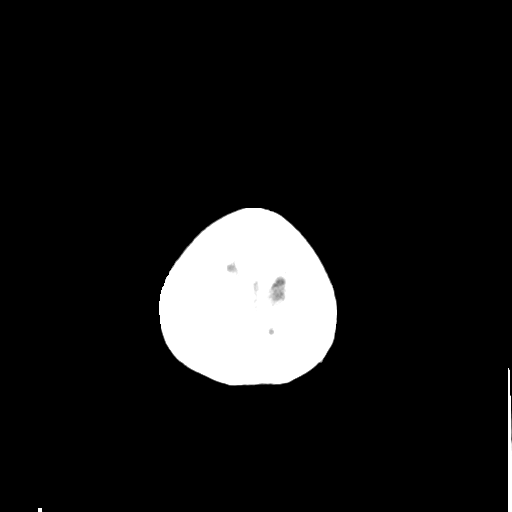

[Series 4: coronal soft · coronal · 0.36mm/px · 3 of 73 slices shown]
[im 25/73  brain]
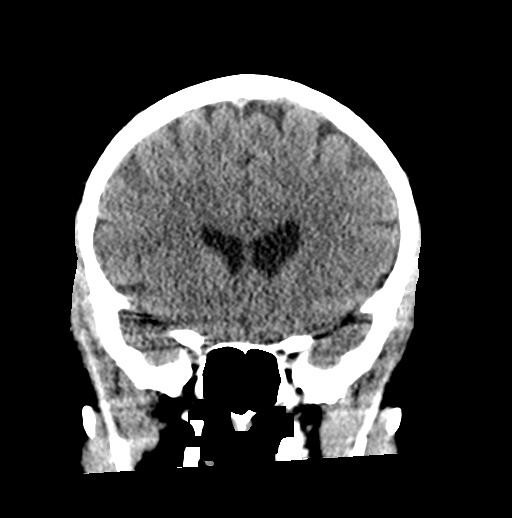
[im 33/73  brain]
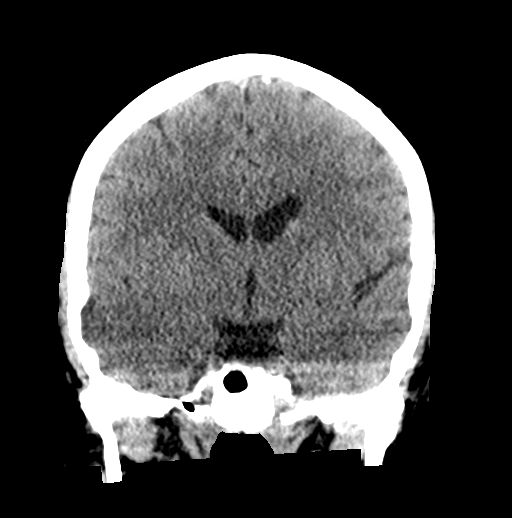
[im 41/73  brain]
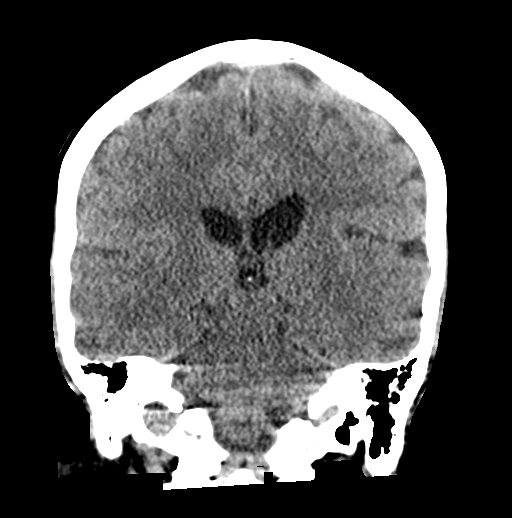

[Series 5: sagittal soft · sagittal · 0.37mm/px · 3 of 62 slices shown]
[im 21/62  brain]
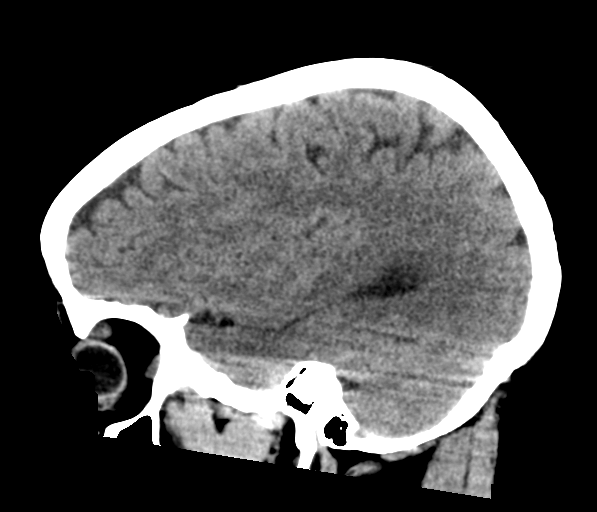
[im 31/62  brain]
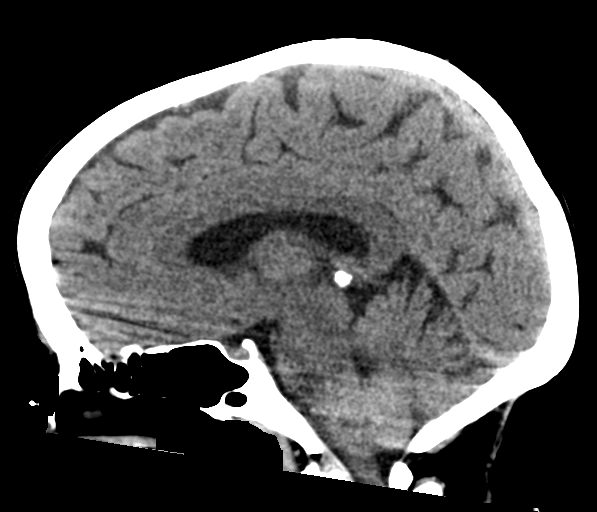
[im 41/62  brain]
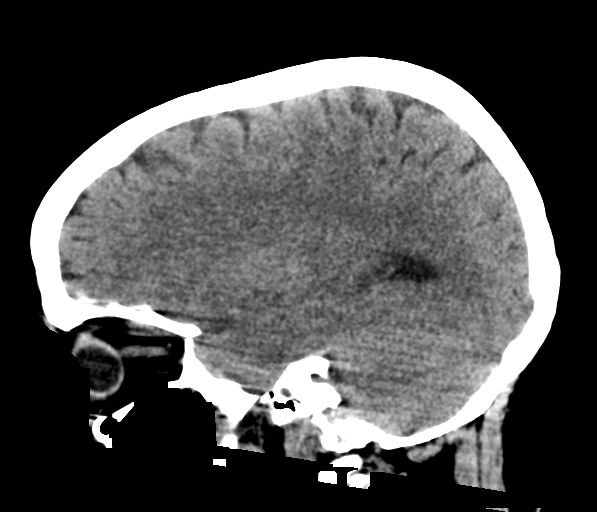

[16 of 47 positions shown; findings below may reference images not displayed]

FINDINGS: Brain: No evidence of acute infarction, hemorrhage, hydrocephalus,
extra-axial collection or mass lesion/mass effect.

Vascular: No hyperdense vessel. Atherosclerotic calcifications of
the intracranial internal carotid arteries at skull base.

Skull: Normal. Negative for fracture or focal lesion.

Sinuses/Orbits: Visualized portions of the paranasal sinuses and
mastoid air cells are predominantly clear. Orbits are grossly
unremarkable.

Other: None
IMPRESSION: No acute intracranial findings.

## 2023-06-23 ENCOUNTER — Telehealth: Payer: Self-pay | Admitting: Neurology

## 2023-06-23 NOTE — Telephone Encounter (Signed)
 Pt said having black outs, was having a conversation with teachers and blacked out. Do not know if was a seizure. Have not seen PCP. Would like a call from the nurse.

## 2023-06-23 NOTE — Telephone Encounter (Signed)
Pt returned the call. Please call back when available.

## 2023-06-23 NOTE — Telephone Encounter (Signed)
 Call to patient, no answer. Left message to return call

## 2023-06-24 ENCOUNTER — Other Ambulatory Visit: Payer: Self-pay | Admitting: Neurology

## 2023-06-24 NOTE — Telephone Encounter (Signed)
 Call to patient, no answer. Left message to return call

## 2023-06-24 NOTE — Telephone Encounter (Signed)
 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 She did not fall or hit her head. She states fellow teachers said she just stopped talking. Her co worker told her she felt like it was a seizure, no convulsions. She is unsure of how long it lasted. This is the first time this has happened. She is no longer taking Keppra  and last note says  follow up with PCP. Advised I will send to Dr. Samara Crest to review

## 2023-06-24 NOTE — Telephone Encounter (Signed)
 This event is different from her typical seizure described as tonic clonic event. Continue to monitor the event and contact us  if worse.

## 2023-07-31 ENCOUNTER — Other Ambulatory Visit: Payer: Self-pay | Admitting: Adult Health

## 2023-07-31 MED ORDER — NYSTATIN 100000 UNIT/GM EX POWD: CUTANEOUS | 2 refills | Status: AC

## 2023-07-31 NOTE — Progress Notes (Signed)
Rx nystatin powder.  

## 2023-08-02 ENCOUNTER — Other Ambulatory Visit: Payer: Self-pay | Admitting: Adult Health

## 2023-10-23 ENCOUNTER — Encounter: Payer: Self-pay | Admitting: Radiology

## 2023-11-23 ENCOUNTER — Other Ambulatory Visit (HOSPITAL_COMMUNITY): Payer: Self-pay | Admitting: Adult Health

## 2023-11-23 DIAGNOSIS — Z1231 Encounter for screening mammogram for malignant neoplasm of breast: Secondary | ICD-10-CM

## 2023-12-16 ENCOUNTER — Encounter (INDEPENDENT_AMBULATORY_CARE_PROVIDER_SITE_OTHER): Payer: Self-pay | Admitting: Gastroenterology

## 2024-01-04 ENCOUNTER — Encounter: Payer: Self-pay | Admitting: Radiology

## 2024-01-08 ENCOUNTER — Ambulatory Visit (HOSPITAL_COMMUNITY): Payer: Self-pay

## 2024-01-12 ENCOUNTER — Ambulatory Visit: Payer: Self-pay | Admitting: Orthopedic Surgery

## 2024-01-18 ENCOUNTER — Ambulatory Visit (HOSPITAL_COMMUNITY)
Admission: RE | Admit: 2024-01-18 | Discharge: 2024-01-18 | Disposition: A | Discharge status: Home / Self Care | Payer: Self-pay | Source: Ambulatory Visit | Attending: Adult Health | Admitting: Adult Health

## 2024-01-18 DIAGNOSIS — Z1231 Encounter for screening mammogram for malignant neoplasm of breast: Secondary | ICD-10-CM | POA: Diagnosis present

## 2024-01-20 ENCOUNTER — Ambulatory Visit: Payer: Self-pay | Admitting: Adult Health

## 2024-01-22 ENCOUNTER — Ambulatory Visit: Admitting: Orthopedic Surgery

## 2024-02-01 ENCOUNTER — Telehealth: Payer: Self-pay | Admitting: Neurology

## 2024-02-01 NOTE — Telephone Encounter (Signed)
 Ok, thank you

## 2024-02-01 NOTE — Telephone Encounter (Signed)
 Pt called to request apt MD  the patient stated that she is having some seizure like symptoms the patient has not had a seizure she stated something feel off and need to get appt with MD  to follow up the patient stated she has been spacing out a lot and just would like to se MD . There was an appt open , Pt Scheduled

## 2024-02-02 ENCOUNTER — Encounter: Payer: Self-pay | Admitting: Orthopedic Surgery

## 2024-02-02 ENCOUNTER — Ambulatory Visit: Admitting: Orthopedic Surgery

## 2024-02-02 ENCOUNTER — Other Ambulatory Visit: Payer: Self-pay

## 2024-02-02 VITALS — BP 152/89 | HR 80 | Ht 65.0 in | Wt 182.0 lb

## 2024-02-02 DIAGNOSIS — M25552 Pain in left hip: Secondary | ICD-10-CM

## 2024-02-02 MED ORDER — PREDNISONE 10 MG (21) PO TBPK: ORAL_TABLET | ORAL | 0 refills | Status: DC

## 2024-02-02 NOTE — Progress Notes (Signed)
 New Patient Visit  Assessment & Plan Left hip pain likely due to gluteal muscle or tendon strain Chronic left hip pain localized to the buttock, likely due to gluteal muscle or tendon strain. X-rays normal. Differential includes sciatic nerve irritation, muscle strain or even hamstring tendon involvement.. No bursitis or low back pain. - Prescribed prednisone  for 10 days. - Consider referral for ultrasound-guided injection around the sciatic nerve if pain persists or recurs. - Recommended physical therapy if not improving - Advised to remain active and report worsening symptoms.     Follow-up: Return if symptoms worsen or fail to improve.  Subjective:  Chief Complaint  Patient presents with   Hip Pain    Left lateral hip painful states no back pain / patient states its her hip and was her hip when she saw Dr Brenna as well.      Discussed the use of AI scribe software for clinical note transcription with the patient, who gave verbal consent to proceed.  History of Present Illness Deborah Lucero is a 63 year old female who presents with left hip pain disturbing her sleep.  She has had persistent left buttock pain since September after bowling with her children. The pain is localized to the buttock without radiation to the leg and without pain in the lateral hip, groin, or lower back. It limits walking her dogs and exercise classes and wakes her from sleep. Advil  twice daily has not provided significant relief. She denies numbness, tingling, or shooting pain down the leg. She notices her right leg feels shorter than the left when standing for extended periods of time such as singing in church.  She recalls similar pain in the past that was evaluated but does not remember the treatment. She denies prior injury to the left hip. 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    Review of  Systems: No fevers or chills No numbness or tingling No chest pain No shortness of breath No bowel or bladder dysfunction No GI distress No headaches   Medical History:  Past Medical History:  Diagnosis Date   Abdominal pain 05/05/2013   Anxiety    Anxiety and depression 10/04/2015   Constipation 05/05/2013   Depression    Hiatal hernia    Insomnia    Mental disorder    PONV (postoperative nausea and vomiting)    Scoliosis    Seizures (HCC)    Watery stools 05/05/2013    Past Surgical History:  Procedure Laterality Date   BREAST BIOPSY Right 1986   Benign   BREAST SURGERY     lumpectomy right breast-benign   CESAREAN SECTION  06/24/1999   DIAGNOSTIC LAPAROSCOPY     HYSTEROSCOPY WITH THERMACHOICE  08/14/2011   Procedure: HYSTEROSCOPY WITH THERMACHOICE;  Surgeon: Vonn VEAR Inch, MD;  Location: [facility];  Service: Gynecology;;  Thermachoice Endometrial Ablation Total Therapy Time=52min 44sec    Family History  Problem Relation Age of Onset   Alzheimer's disease Father    Alzheimer's disease Sister    Seizures Neg Hx    Social History   Tobacco Use   Smoking status: Former    Types: Cigarettes   Smokeless tobacco: Never  Vaping Use   Vaping status: Never Used  Substance Use Topics   Alcohol use: Yes    Alcohol/week: 6.0 standard drinks of alcohol    Types: 6 Cans of beer per week  Comment: occ   Drug use: Not Currently    Allergies  Allergen Reactions   Codeine Other (See Comments)    Hallucinations   Hydrocodone  Other (See Comments)    Syncope     Current Meds  Medication Sig   busPIRone  (BUSPAR ) 5 MG tablet Take 1 tablet (5 mg total) by mouth 2 (two) times daily.   folic acid  (FOLVITE ) 1 MG tablet Take 1 tablet (1 mg total) by mouth daily.   ibuprofen  (ADVIL ) 200 MG tablet Take 200-600 mg by mouth daily as needed for headache or mild pain.   Multiple Vitamin (MULTIVITAMIN) tablet Take 1 tablet by mouth daily.   nystatin  (MYCOSTATIN /NYSTOP )  powder Use 2-3 x daily as needed   predniSONE  (STERAPRED UNI-PAK 21 TAB) 10 MG (21) TBPK tablet 10 mg DS 12 as directed   pseudoephedrine (SUDAFED) 120 MG 12 hr tablet Take 120 mg by mouth as needed.   traZODone  (DESYREL ) 100 MG tablet TAKE 1 TABLET(100 MG) BY MOUTH AT BEDTIME    Objective: BP (!) 152/89   Pulse 80   Ht 5' 5 (1.651 m)   Wt 182 lb (82.6 kg)   BMI 30.29 kg/m   Physical Exam:    General: Alert and oriented. and No acute distress. Gait: Left sided antalgic gait.  Physical Exam MUSCULOSKELETAL: Hips with good range of motion bilaterally. No tenderness over the greater trochanter. No lower back pain. No pain with hip manipulation.  She has some discomfort in the left buttock with resisted knee flexion.  Sensation is intact distally.  Toes warm and well-perfused.   IMAGING: I personally ordered and reviewed the following images   [facility] pelvis and left hip x-rays were obtained in clinic today.  No acute injuries are noted.  Well-maintained joint space within bilateral hips.  No osteophytes are appreciated.  No evidence of a chronic injury.  No AVN.  No bony lesions.  Impression: Negative left hip x-ray   New Medications:  Meds ordered this encounter  Medications   predniSONE  (STERAPRED UNI-PAK 21 TAB) 10 MG (21) TBPK tablet    Sig: 10 mg DS 12 as directed    Dispense:  48 tablet    Refill:  0      Portions of this note were completed via Scientist, clinical (histocompatibility and immunogenetics).  Oneil DELENA Horde, MD  02/02/2024 3:00 PM

## 2024-02-08 ENCOUNTER — Telehealth: Payer: Self-pay | Admitting: Neurology

## 2024-02-08 ENCOUNTER — Ambulatory Visit: Payer: Self-pay | Admitting: Adult Health

## 2024-02-08 NOTE — Telephone Encounter (Signed)
 Lvm 1st attempt by hf 02/08/24

## 2024-02-08 NOTE — Telephone Encounter (Addendum)
 Pt believes she has had seizure activity and questions if Dr Gregg would put her on seizure medication.  Pt was reminded she has not been seen in over 2 years and that Dr Gregg will likely want to see her 1st. Pt has made appointment and is on wait list, she states she  and family are concerned , and would still like a call from RN.

## 2024-02-08 NOTE — Telephone Encounter (Signed)
 Pt has called back and has been r/s with wait list

## 2024-02-09 ENCOUNTER — Ambulatory Visit: Payer: PRIVATE HEALTH INSURANCE | Admitting: Neurology

## 2024-02-10 ENCOUNTER — Encounter: Payer: Self-pay | Admitting: Neurology

## 2024-02-10 ENCOUNTER — Ambulatory Visit: Admitting: Adult Health

## 2024-02-10 ENCOUNTER — Ambulatory Visit: Payer: PRIVATE HEALTH INSURANCE | Admitting: Neurology

## 2024-02-10 VITALS — BP 166/94 | HR 86 | Ht 65.0 in | Wt 173.0 lb

## 2024-02-10 DIAGNOSIS — R569 Unspecified convulsions: Secondary | ICD-10-CM

## 2024-02-10 MED ORDER — LEVETIRACETAM 500 MG PO TABS: 500.0000 mg | ORAL_TABLET | Freq: Two times a day (BID) | ORAL | 3 refills | Status: AC

## 2024-02-10 NOTE — Progress Notes (Signed)
 [facility]  PATIENT: Deborah Lucero DOB: Mar 12, 1960  REQUESTING CLINICIAN: Sheryle Carwin, MD HISTORY FROM: Patient and daughter  REASON FOR VISIT: Seizure disorder   HISTORICAL  CHIEF COMPLAINT:  Chief Complaint  Patient presents with   RM 13    Seizure disorder; reports increase in absent seizures in the last year, ~4, most recent was after Thanksgiving; with ex-spouse    INTERVAL HISTORY 02/10/2024 Patient presents today for follow-up, she is accompanied by her ex-husband.  Last visit was in December 2023.  At that time, we repeated her EEG which was normal and stopped the Keppra  because it was deemed to be symptomatic seizure from the hyponatremia.  She did well until the past 6 to 8 months when she restarted having seizures.  Her seizures are described as per room iod of unresponsiveness, loss of consciousness and sometimes associated with shaking.  She had 1 during the summer while at the beach, the day after Thanksgiving and the last event was last week while driving with her boyfriend.  She denies any major injury with her seizure but the event that happened at the beach she did have urinary incontinence.  She did not seek medical care right after this particular seizures.   INTERVAL HISTORY 02/05/2022:  Patient presents today for follow-up, denies any seizure since last visit.  She reports that her latest sodium has been normal to the point that her nephrologist stopped the desmopressin .  She is compliant with the Keppra  500 mg twice daily but reports side effect of dull headache.  No other complaints no other concerns.    HISTORY OF PRESENT ILLNESS:  This is a 63 year old woman past medical history of anxiety/depression and seizure who is presenting after being admitted recently for seizure in the setting of hyponatremia.  Per daughter patient had a generalized tonic-clonic seizures on August 12 which required her to call EMS.  She believes that patient  may have a second seizure on the way to the hospital.  In the hospital she was noted to have hyponatremia down to 111.  Seizure was thought to be secondary to hyponatremia.  Patient was seen by nephrology, started on desmopressin  and fluid restriction with improvement of the sodium. Her last sodium level was 128. Patient also had a similar presentation last year in September when she had a seizure and was found to be hyponatremic to 124.  At that time she was not started on medication but on this last hospitalization she was started on Keppra  500 mg twice daily.  She reports compliance with the medication, denies any side effect.  Prior to hospitalization she was on Wellbutrin  but upon discharge Wellbutrin  was not restarted so there is some concern for mood and irritability.  She denies any other seizure risk factor except history of alcohol abuse. She reports drinking a12 pack of beer in a week but since discharge from the hospital she has not drink alcohol.     Handedness: Right handed   Onset: August 12 but had her first seizure November 02 2020  Seizure Type: Generalized seizure with tongue biting   Current frequency: Last event in early December 2025  Any injuries from seizures: Tongue biting   Seizure risk factors: Hyponatremia, mother has seizure from encephalitis  Previous ASMs: Levetiracetam    Currenty ASMs: None   ASMs side effects: Questionable Irritability (She was on Wellbutrin  but after recent seizures, meds was discontinued)   Brain Images: Normal head CT   Previous EEGs: Diffuse slowing  Hospital course and summary  Patient is a 63 year old female with history of chronic alcohol abuse who presented with a seizure on 8/11 at [facility] and was seen at local hospital.  Head CT was negative and was started on Keppra .  History of seizure a year ago from hyponatremia also.  She again had seizure-like episode on 8/12 and was brought to the emergency department.  Patient was  encephalopathic on presentation.  CT head did not show any acute abnormalities.  Sodium was found to be 111.  Patient was admitted for the management of hyponatremic induced seizure.  Neurology consulted, admitted to ICU.  Hospital course remarkable for persistent hyponatremia, nephrology was consulted.  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  Started on nasal DDAVP .  Sodium level has improved to 129 today.  Patient has been cleared by nephrology for discharge.  She will be followed by nephrology in a week.   Seizure/acute encephalopathy: Presented with 2-3 episodes of seizure.  History of seizure in the past due to hyponatremia.  Postictal on presentation.  Initially started on CIWA protocol.  Currently not in withdrawal. Currently on Keppra .  We recommend her to follow-up with neurology as an outpatient.  EEG showed mild generalized nonspecific cerebral dysfunction, no seizure epileptiform activity. Currently she is alert and oriented.   Hyponatremia: Initially  to be secondary to beer potomania/decreased fluid intake.SABRA  History of chronic hyponatremia.  Nephrology following.  Started on DDAVP  for suspicion of concurrent central  diabetes insipidus.  Sodium stable at 129 today.  She will be discharged with plan for follow-up with nephrology in a week   Chronic alcohol abuse: Drinks beer.  Counseled for cessation. continue thiamine  folic acid .   OTHER MEDICAL CONDITIONS: Anxiety/Depression, seizure  REVIEW OF SYSTEMS: Full 14 system review of systems performed and negative with exception of: As noted in the HPI   ALLERGIES: Allergies  Allergen Reactions   Codeine Other (See Comments)    Hallucinations   Hydrocodone  Other (See Comments)    Syncope     HOME MEDICATIONS: Outpatient Medications Prior to Visit  Medication Sig Dispense Refill   busPIRone  (BUSPAR ) 5 MG tablet Take 1 tablet (5 mg total) by mouth 2 (two) times daily. 60 tablet 12   folic acid  (FOLVITE )  1 MG tablet Take 1 tablet (1 mg total) by mouth daily. 30 tablet 1   ibuprofen  (ADVIL ) 200 MG tablet Take 200-600 mg by mouth daily as needed for headache or mild pain.     Multiple Vitamin (MULTIVITAMIN) tablet Take 1 tablet by mouth daily.     nystatin  (MYCOSTATIN /NYSTOP ) powder Use 2-3 x daily as needed 60 g 2   predniSONE  (STERAPRED UNI-PAK 48 TAB) 10 MG (48) TBPK tablet Take by mouth as directed.     pseudoephedrine (SUDAFED) 120 MG 12 hr tablet Take 120 mg by mouth as needed.     traZODone  (DESYREL ) 100 MG tablet TAKE 1 TABLET(100 MG) BY MOUTH AT BEDTIME 30 tablet 6   predniSONE  (STERAPRED UNI-PAK 21 TAB) 10 MG (21) TBPK tablet 10 mg DS 12 as directed 48 tablet 0   No facility-administered medications prior to visit.    PAST MEDICAL HISTORY: Past Medical History:  Diagnosis Date   Abdominal pain 05/05/2013   Anxiety    Anxiety and depression 10/04/2015   Constipation 05/05/2013   Depression    Hiatal hernia    Insomnia    Mental disorder    PONV (postoperative nausea and vomiting)  Scoliosis    Seizures (HCC)    Watery stools 05/05/2013    PAST SURGICAL HISTORY: Past Surgical History:  Procedure Laterality Date   BREAST BIOPSY Right 1986   Benign   BREAST SURGERY     lumpectomy right breast-benign   CESAREAN SECTION  06/24/1999   DIAGNOSTIC LAPAROSCOPY     HYSTEROSCOPY WITH THERMACHOICE  08/14/2011   Procedure: HYSTEROSCOPY WITH THERMACHOICE;  Surgeon: Vonn VEAR Inch, MD;  Location: [facility];  Service: Gynecology;;  Thermachoice Endometrial Ablation Total Therapy Time=69min 44sec    FAMILY HISTORY: Family History  Problem Relation Age of Onset   Alzheimer's disease Father    Alzheimer's disease Sister    Seizures Neg Hx     SOCIAL HISTORY: Social History   Socioeconomic History   Marital status: Single    Spouse name: Not on file   Number of children: Not on file   Years of education: Not on file   Highest education level: Not on file  Occupational  History   Not on file  Tobacco Use   Smoking status: Former    Types: Cigarettes   Smokeless tobacco: Never  Vaping Use   Vaping status: Never Used  Substance and Sexual Activity   Alcohol use: Yes    Alcohol/week: 6.0 standard drinks of alcohol    Types: 6 Cans of beer per week    Comment: occ   Drug use: Not Currently   Sexual activity: Not Currently    Birth control/protection: Surgical, Post-menopausal    Comment: ablation  Other Topics Concern   Not on file  Social History Narrative   ** Merged History Encounter **       Social Drivers of Health   Financial Resource Strain: Low Risk  (02/02/2023)   Overall Financial Resource Strain (CARDIA)    Difficulty of Paying Living Expenses: Not hard at all  Food Insecurity: No Food Insecurity (02/02/2023)   Hunger Vital Sign    Worried About Running Out of Food in the Last Year: Never true    Ran Out of Food in the Last Year: Never true  Transportation Needs: No Transportation Needs (02/02/2023)   PRAPARE - Administrator, Civil Service (Medical): No    Lack of Transportation (Non-Medical): No  Physical Activity: Sufficiently Active (02/02/2023)   Exercise Vital Sign    Days of Exercise per Week: 5 days    Minutes of Exercise per Session: 40 min  Stress: Stress Concern Present (02/02/2023)   [employer] of Occupational Health - Occupational Stress Questionnaire    Feeling of Stress : Rather much  Social Connections: Moderately Isolated (02/02/2023)   Social Connection and Isolation Panel    Frequency of Communication with Friends and Family: More than three times a week    Frequency of Social Gatherings with Friends and Family: Three times a week    Attends Religious Services: More than 4 times per year    Active Member of Clubs or Organizations: No    Attends Banker Meetings: Never    Marital Status: Divorced  Catering Manager Violence: Not At Risk (02/02/2023)   Humiliation, Afraid, Rape, and  Kick questionnaire    Fear of Current or Ex-Partner: No    Emotionally Abused: No    Physically Abused: No    Sexually Abused: No    PHYSICAL EXAM  GENERAL EXAM/CONSTITUTIONAL: Vitals:  Vitals:   02/10/24 1034  BP: (!) 166/94  Pulse: 86  Weight: 173 lb (78.5 kg)  Height: 5' 5 (1.651 m)    Body mass index is 28.79 kg/m. Wt Readings from Last 3 Encounters:  02/10/24 173 lb (78.5 kg)  02/02/24 182 lb (82.6 kg)  02/02/23 182 lb (82.6 kg)   Patient is in no distress; well developed, nourished and groomed; neck is supple  MUSCULOSKELETAL: Gait, strength, tone, movements noted in Neurologic exam below  NEUROLOGIC: MENTAL STATUS:      No data to display         awake, alert, oriented to person, place and time recent and remote memory intact normal attention and concentration language fluent, comprehension intact, naming intact fund of knowledge appropriate  CRANIAL NERVE:  2nd, 3rd, 4th, 6th - visual fields full to confrontation, extraocular muscles intact, no nystagmus. Right ptosis but this is chronic per patient.  5th - facial sensation symmetric 7th - facial strength symmetric 8th - hearing intact 9th - palate elevates symmetrically, uvula midline 11th - shoulder shrug symmetric 12th - tongue protrusion midline  MOTOR:  normal bulk and tone, full strength in the BUE, BLE  SENSORY:  normal and symmetric to light touch  COORDINATION:  finger-nose-finger, fine finger movements normal  GAIT/STATION:  normal   DIAGNOSTIC DATA (LABS, IMAGING, TESTING) - I reviewed patient records, labs, notes, testing and imaging myself where available.  Lab Results  Component Value Date   WBC 9.1 10/16/2021   HGB 12.3 10/16/2021   HCT 33.0 (L) 10/16/2021   MCV 85.7 10/16/2021   PLT 331 10/16/2021      Component Value Date/Time   NA 140 02/05/2022 1600   K 4.6 02/05/2022 1600   CL 102 02/05/2022 1600   CO2 25 02/05/2022 1600   GLUCOSE 97 02/05/2022 1600    GLUCOSE 103 (H) 10/18/2021 0426   BUN 11 02/05/2022 1600   CREATININE 0.86 02/05/2022 1600   CREATININE 0.85 05/05/2013 1710   CALCIUM 10.4 (H) 02/05/2022 1600   PROT 5.3 (L) 10/13/2021 0541   PROT 6.6 06/27/2019 0819   ALBUMIN 3.3 (L) 10/13/2021 0541   ALBUMIN 4.1 06/27/2019 0819   AST 32 10/13/2021 0541   ALT 18 10/13/2021 0541   ALKPHOS 49 10/13/2021 0541   BILITOT 0.9 10/13/2021 0541   BILITOT <0.2 06/27/2019 0819   GFRNONAA >60 10/18/2021 0426   GFRAA 72 06/27/2019 0819   Lab Results  Component Value Date   CHOL 230 (H) 12/31/2018   HDL 44 12/31/2018   LDLCALC 147 (H) 12/31/2018   TRIG 214 (H) 12/31/2018   Lab Results  Component Value Date   HGBA1C 5.0 10/04/2015   No results found for: VITAMINB12 Lab Results  Component Value Date   TSH 1.513 10/13/2021    MRI Brain 11/16/20 Intermittently motion degraded examination, as described. No evidence of acute intracranial abnormality. No specific seizure focus is identified. A few small scattered foci of T2/FLAIR hyperintense signal abnormality within the cerebral white matter are nonspecific, but most often secondary to chronic small vessel ischemia.   Mild generalized cerebral atrophy. CT Head 10/13/21 No acute intracranial CT findings or interval changes. Scattered carotid atherosclerosis.  EEG 10/13/21:  This EEG is consistent with a mild generalized nonspecific cerebral dysfunction (encephalopathy). There was no seizure or seizure predisposition recorded on this study. Please note that lack of epileptiform activity on EEG does not preclude the possibility of epilepsy.  I personally reviewed brain Images and previous EEG reports.   ASSESSMENT AND PLAN  63 y.o. year old female  with history of anxiety and depression  who is presenting for follow up.  She did have a symptomatic seizure in the setting of hyponatremia, initially put on Keppra  but since her sodium normalized, Keppra  was discontinued.  She did well until  about 6 to 8 months ago when she started having seizures.  Plan will be to restart Keppra , 500 mg nightly and increase to 500 mg twice daily in 1 week, I will also obtain EEGs for further evaluation.  I will see her in 6 months for follow-up but she understand to contact us  with updates.   1. Seizures (HCC)      Patient Instructions  Restart Keppra  500 mg nightly for 1 week then increase to 500 mg twice daily Will obtain routine EEG, if normal we will proceed with 3-day ambulatory EEG Continue your other medications Continue to update us  Follow-up in 6 months or sooner if worse   Per [place]  DMV statutes, patients with seizures are not allowed to drive until they have been seizure-free for six months.  Other recommendations include using caution when using heavy equipment or power tools. Avoid working on ladders or at heights. Take showers instead of baths.  Do not swim alone.  Ensure the water temperature is not too high on the home water heater. Do not go swimming alone. Do not lock yourself in a room alone (i.e. bathroom). When caring for infants or small children, sit down when holding, feeding, or changing them to minimize risk of injury to the child in the event you have a seizure. Maintain good sleep hygiene. Avoid alcohol.  Also recommend adequate sleep, hydration, good diet and minimize stress.   During the Seizure  - First, ensure adequate ventilation and place patients on the floor on their left side  Loosen clothing around the neck and ensure the airway is patent. If the patient is clenching the teeth, do not force the mouth open with any object as this can cause severe damage - Remove all items from the surrounding that can be hazardous. The patient may be oblivious to what's happening and may not even know what he or she is doing. If the patient is confused and wandering, either gently guide him/her away and block access to outside areas - Reassure the individual and be  comforting - Call 911. In most cases, the seizure ends before EMS arrives. However, there are cases when seizures may last over 3 to 5 minutes. Or the individual may have developed breathing difficulties or severe injuries. If a pregnant patient or a person with diabetes develops a seizure, it is prudent to call an ambulance. - Finally, if the patient does not regain full consciousness, then call EMS. Most patients will remain confused for about 45 to 90 minutes after a seizure, so you must use judgment in calling for help. - Avoid restraints but make sure the patient is in a bed with padded side rails - Place the individual in a lateral position with the neck slightly flexed; this will help the saliva drain from the mouth and prevent the tongue from falling backward - Remove all nearby furniture and other hazards from the area - Provide verbal assurance as the individual is regaining consciousness - Provide the patient with privacy if possible - Call for help and start treatment as ordered by the caregiver   After the Seizure (Postictal Stage)  After a seizure, most patients experience confusion, fatigue, muscle pain and/or a headache. Thus, one should permit the individual to sleep. For the next few  days, reassurance is essential. Being calm and helping reorient the person is also of importance.  Most seizures are painless and end spontaneously. Seizures are not harmful to others but can lead to complications such as stress on the lungs, brain and the heart. Individuals with prior lung problems may develop labored breathing and respiratory distress.     Orders Placed This Encounter  Procedures   EEG adult    Meds ordered this encounter  Medications   levETIRAcetam  (KEPPRA ) 500 MG tablet    Sig: Take 1 tablet (500 mg total) by mouth 2 (two) times daily.    Dispense:  180 tablet    Refill:  3    Return in about 6 months (around 08/10/2024).   Pastor Falling, MD 02/10/2024, 10:59  AM  [facility] [address], [address] [place], [place] [address] [phone]

## 2024-02-10 NOTE — Patient Instructions (Signed)
 Restart Keppra  500 mg nightly for 1 week then increase to 500 mg twice daily Will obtain routine EEG, if normal we will proceed with 3-day ambulatory EEG Continue your other medications Continue to update us  Follow-up in 6 months or sooner if worse

## 2024-02-16 ENCOUNTER — Other Ambulatory Visit: Payer: Self-pay | Admitting: Adult Health

## 2024-02-18 ENCOUNTER — Ambulatory Visit: Admitting: *Deleted

## 2024-02-18 DIAGNOSIS — R569 Unspecified convulsions: Secondary | ICD-10-CM | POA: Diagnosis not present

## 2024-02-18 NOTE — Procedures (Signed)
° ° °  History:  63 year old woman with seizure   EEG classification: Awake and drowsy  Duration: 26 minutes   Technical aspects: This EEG study was done with scalp electrodes positioned according to the 10-20 International system of electrode placement. Electrical activity was reviewed with band pass filter of 1-70Hz , sensitivity of 7 uV/mm, display speed of 1mm/sec with a 60Hz  notched filter applied as appropriate. EEG data were recorded continuously and digitally stored.   Description of the recording: The background rhythms of this recording consists of a fairly well modulated medium amplitude alpha rhythm of 10 Hz that is reactive to eye opening and closure. Present in the anterior head region is a 15-20 Hz beta activity. Photic stimulation was performed, did not show any abnormalities. Hyperventilation was also performed, did not show any abnormalities. Drowsiness was manifested by background fragmentation. No abnormal epileptiform discharges seen during this recording. There was no focal slowing. There were no electrographic seizure identified.   Abnormality: None   Impression: This is a normal awake and drowsy EEG. No evidence of interictal epileptiform discharges. Normal EEGs, however, do not rule out epilepsy.    Evangelynn Lochridge, MD [facility]

## 2024-02-19 ENCOUNTER — Ambulatory Visit: Payer: Self-pay | Admitting: Neurology

## 2024-02-29 ENCOUNTER — Other Ambulatory Visit: Payer: Self-pay | Admitting: Adult Health

## 2024-04-20 ENCOUNTER — Ambulatory Visit: Admitting: Adult Health

## 2024-09-08 ENCOUNTER — Ambulatory Visit: Payer: PRIVATE HEALTH INSURANCE | Admitting: Neurology
